# Patient Record
Sex: Female | Born: 1990 | Race: Black or African American | Hispanic: No | Marital: Single | Smoking: Never smoker
Health system: Southern US, Community
[De-identification: ages and names within clinical notes are randomized; demographics above are authoritative.]

## PROBLEM LIST (undated history)

## (undated) ENCOUNTER — Inpatient Hospital Stay (HOSPITAL_COMMUNITY): Payer: Self-pay

## (undated) DIAGNOSIS — K219 Gastro-esophageal reflux disease without esophagitis: Secondary | ICD-10-CM

## (undated) DIAGNOSIS — Z789 Other specified health status: Secondary | ICD-10-CM

## (undated) HISTORY — PX: WISDOM TOOTH EXTRACTION: SHX21

## (undated) HISTORY — PX: MANDIBLE SURGERY: SHX707

## (undated) HISTORY — PX: MANDIBLE RECONSTRUCTION: SHX431

## (undated) HISTORY — DX: Other specified health status: Z78.9

---
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
## 2012-01-23 ENCOUNTER — Other Ambulatory Visit (HOSPITAL_COMMUNITY)
Admission: RE | Admit: 2012-01-23 | Discharge: 2012-01-23 | Disposition: A | Discharge status: Home / Self Care | Payer: Medicaid Other | Source: Ambulatory Visit | Attending: Emergency Medicine | Admitting: Emergency Medicine

## 2012-01-23 ENCOUNTER — Emergency Department (INDEPENDENT_AMBULATORY_CARE_PROVIDER_SITE_OTHER)
Admission: EM | Admit: 2012-01-23 | Discharge: 2012-01-23 | Disposition: A | Discharge status: Home / Self Care | Payer: Medicaid Other | Source: Home / Self Care | Attending: Emergency Medicine | Admitting: Emergency Medicine

## 2012-01-23 ENCOUNTER — Encounter (HOSPITAL_COMMUNITY): Payer: Self-pay

## 2012-01-23 DIAGNOSIS — N760 Acute vaginitis: Secondary | ICD-10-CM | POA: Insufficient documentation

## 2012-01-23 DIAGNOSIS — N898 Other specified noninflammatory disorders of vagina: Secondary | ICD-10-CM

## 2012-01-23 DIAGNOSIS — Z113 Encounter for screening for infections with a predominantly sexual mode of transmission: Secondary | ICD-10-CM | POA: Insufficient documentation

## 2012-01-23 LAB — POCT PREGNANCY, URINE: Preg Test, Ur: NEGATIVE

## 2012-01-23 MED ORDER — METRONIDAZOLE 500 MG PO TABS: 500.0000 mg | ORAL_TABLET | Freq: Two times a day (BID) | ORAL | Status: DC

## 2012-01-23 NOTE — ED Provider Notes (Signed)
History     CSN: [id number]  Arrival date & time 01/23/12  1410   First MD Initiated Contact with Patient 01/23/12 1416      Chief Complaint  Patient presents with  . Vaginal Discharge    (Consider location/radiation/quality/duration/timing/severity/associated sxs/prior treatment) HPI Comments: Patient presents urgent care describing that she has a newly developed vaginal discharge that started about 5-6 days ago. Does not itch. Denies any pelvic pressure or pain. Denies any urinary symptoms such as increased pressure frequency or burning with urination. No fevers nausea or vomiting. She describes that during the month of November she was called informing her that she had " gonorrhea", and that she took treatment that was: In to pharmacy for this.( On chart review patient was diagnosed with Gardnerella vaginalis and prescribed Flagyl this was clarified today  Patient).  She was somewhat concerned that as she was treated her partner was not treated at that time, and she has had unprotected sex with this partner. Was concerned that perhaps she has been reexposed to an STD.  Patient is a 22 y.o. female presenting with vaginal discharge.  Vaginal Discharge This is a new problem. The current episode started more than 2 days ago. The problem occurs constantly. The problem has not changed since onset.Pertinent negatives include no abdominal pain and no shortness of breath. Nothing aggravates the symptoms. Nothing relieves the symptoms. She has tried nothing for the symptoms.    History reviewed. No pertinent past medical history.  History reviewed. No pertinent past surgical history.  No family history on file.  History  Substance Use Topics  . Smoking status: Not on file  . Smokeless tobacco: Not on file  . Alcohol Use: Not on file    OB History    Grav Para Term Preterm Abortions TAB SAB Ect Mult Living                  Review of Systems  Constitutional: Negative for chills,  activity change and appetite change.  Respiratory: Negative for shortness of breath.   Gastrointestinal: Negative for abdominal pain.  Genitourinary: Positive for vaginal discharge. Negative for dysuria, frequency and vaginal pain.  Musculoskeletal: Negative for myalgias, back pain and arthralgias.    Allergies  Review of patient's allergies indicates no known allergies.  Home Medications   Current Outpatient Rx  Name  Route  Sig  Dispense  Refill  . FLUCONAZOLE 150 MG PO TABS   Oral   Take 1 tablet (150 mg total) by mouth once.   1 tablet   5   . METRONIDAZOLE 500 MG PO TABS   Oral   Take 1 tablet (500 mg total) by mouth 2 (two) times daily.   14 tablet   0   . METRONIDAZOLE 500 MG PO TABS   Oral   Take 1 tablet (500 mg total) by mouth 2 (two) times daily.   14 tablet   0     BP 113/72  Pulse 83  Temp 97.3 F (36.3 C) (Oral)  Resp 16  SpO2 100%  LMP 12/31/2011  Physical Exam  Abdominal: Normal appearance. There is no tenderness. There is no rigidity, no rebound, no guarding, no CVA tenderness, no tenderness at McBurney's point and negative Murphy's sign.  Genitourinary:       ED Course  Procedures (including critical care time)   Labs Reviewed  POCT PREGNANCY, URINE  URINE CYTOLOGY ANCILLARY ONLY   No results found.   1. Vaginal discharge  MDM   Patient presents with a concern of a newly acquired her STD-vaginal discharge. Patient with no pelvic pain afebrile looks comfortable. Clarified today that she was previously diagnosed with Gardnerella not an STD. Today provided with a Flagyl prescription, patient has been instructed that she will be contacted if abnormal test results       Jimmie Molly, MD 01/23/12 1531

## 2012-01-23 NOTE — ED Notes (Signed)
States she was positive for GC last month, and has repeat syx; she received treatment, but boyfriend did not . Chart reports show negative GC and chlamydia

## 2012-01-27 ENCOUNTER — Encounter (HOSPITAL_COMMUNITY): Payer: Self-pay

## 2012-01-27 ENCOUNTER — Emergency Department (HOSPITAL_COMMUNITY)
Admission: EM | Admit: 2012-01-27 | Discharge: 2012-01-27 | Disposition: A | Discharge status: Home / Self Care | Payer: Medicaid Other | Attending: Emergency Medicine | Admitting: Emergency Medicine

## 2012-01-27 DIAGNOSIS — R112 Nausea with vomiting, unspecified: Secondary | ICD-10-CM | POA: Insufficient documentation

## 2012-01-27 DIAGNOSIS — Z3202 Encounter for pregnancy test, result negative: Secondary | ICD-10-CM | POA: Insufficient documentation

## 2012-01-27 LAB — URINALYSIS, MICROSCOPIC ONLY
Glucose, UA: NEGATIVE mg/dL
Hgb urine dipstick: NEGATIVE
Ketones, ur: 15 mg/dL — AB
Nitrite: NEGATIVE
Protein, ur: NEGATIVE mg/dL
Urobilinogen, UA: 0.2 mg/dL (ref 0.0–1.0)

## 2012-01-27 LAB — CBC WITH DIFFERENTIAL/PLATELET
Basophils Absolute: 0 10*3/uL (ref 0.0–0.1)
Basophils Relative: 0 % (ref 0–1)
HCT: 41.5 % (ref 36.0–46.0)
Hemoglobin: 14 g/dL (ref 12.0–15.0)
MCHC: 33.7 g/dL (ref 30.0–36.0)
Neutrophils Relative %: 88 % — ABNORMAL HIGH (ref 43–77)
Platelets: 178 10*3/uL (ref 150–400)
RDW: 13.3 % (ref 11.5–15.5)
WBC: 8 10*3/uL (ref 4.0–10.5)

## 2012-01-27 LAB — COMPREHENSIVE METABOLIC PANEL
Alkaline Phosphatase: 86 U/L (ref 39–117)
BUN: 13 mg/dL (ref 6–23)
GFR calc Af Amer: >90 mL/min (ref >90)
Glucose, Bld: 100 mg/dL — ABNORMAL HIGH (ref 70–99)
Sodium: 138 mEq/L (ref 135–145)
Total Protein: 7.8 g/dL (ref 6.0–8.3)

## 2012-01-27 MED ORDER — ONDANSETRON HCL 4 MG/2ML IJ SOLN
4.0000 mg | Freq: Once | INTRAMUSCULAR | Status: AC
  Administered 2012-01-27: 4 mg via INTRAVENOUS
  Filled 2012-01-27: qty 2

## 2012-01-27 MED ORDER — ONDANSETRON HCL 4 MG PO TABS: 4.0000 mg | ORAL_TABLET | Freq: Four times a day (QID) | ORAL | Status: DC

## 2012-01-27 MED ORDER — SODIUM CHLORIDE 0.9 % IV BOLUS (SEPSIS)
1000.0000 mL | Freq: Once | INTRAVENOUS | Status: AC
  Administered 2012-01-27: 1000 mL via INTRAVENOUS

## 2012-01-27 NOTE — ED Provider Notes (Signed)
History     CSN: [id number]  Arrival date & time 01/27/12  1624   First MD Initiated Contact with Patient 01/27/12 2033      Chief Complaint  Patient presents with  . Abdominal Pain    (Consider location/radiation/quality/duration/timing/severity/associated sxs/prior treatment) HPI Comments: This am woke with nausea ate lunch than vomited, ate again several hours later vomited again has not eaten since now feeling light headed and nauseate   Patient is a 22 y.o. female presenting with abdominal pain. The history is provided by the patient.  Abdominal Pain The primary symptoms of the illness include abdominal pain, nausea and vomiting. The primary symptoms of the illness do not include fever, shortness of breath, diarrhea, hematemesis, dysuria or vaginal bleeding. The current episode started 6 to 12 hours ago. The onset of the illness was gradual. The problem has not changed since onset. The abdominal pain began 6 to 12 hours ago. The pain came on gradually. The abdominal pain has been unchanged since its onset. The abdominal pain is generalized. The abdominal pain does not radiate. The severity of the abdominal pain is 3/10. The abdominal pain is relieved by vomiting. The abdominal pain is exacerbated by eating.  Nausea began today. The nausea is exacerbated by food.  The vomiting began today. Vomiting occurs 2 to 5 times per day. The emesis contains stomach contents and undigested food.  The patient states that she believes she is currently not pregnant. The patient has not had a change in bowel habit. Symptoms associated with the illness do not include chills, anorexia, diaphoresis, urgency, frequency or back pain.    History reviewed. No pertinent past medical history.  History reviewed. No pertinent past surgical history.  History reviewed. No pertinent family history.  History  Substance Use Topics  . Smoking status: Never Smoker   . Smokeless tobacco: Not on file  . Alcohol  Use: No    OB History    Grav Para Term Preterm Abortions TAB SAB Ect Mult Living                  Review of Systems  Constitutional: Negative for fever, chills and diaphoresis.  HENT: Negative.   Eyes: Negative.   Respiratory: Negative for shortness of breath.   Cardiovascular: Negative for chest pain.  Gastrointestinal: Positive for nausea, vomiting and abdominal pain. Negative for diarrhea, anorexia and hematemesis.  Genitourinary: Negative for dysuria, urgency, frequency, vaginal bleeding and difficulty urinating.  Musculoskeletal: Negative for back pain.  Skin: Negative for rash and wound.    Allergies  Review of patient's allergies indicates no known allergies.  Home Medications   Current Outpatient Rx  Name  Route  Sig  Dispense  Refill  . METRONIDAZOLE 500 MG PO TABS   Oral   Take 1 tablet (500 mg total) by mouth 2 (two) times daily.   14 tablet   0   . ONDANSETRON HCL 4 MG PO TABS   Oral   Take 1 tablet (4 mg total) by mouth every 6 (six) hours.   12 tablet   0     BP 120/72  Pulse 99  Temp 97.7 F (36.5 C) (Oral)  Resp 20  SpO2 100%  LMP 12/31/2011  Physical Exam  Constitutional: She is oriented to person, place, and time. She appears well-developed and well-nourished.  HENT:  Head: Normocephalic.  Eyes: Pupils are equal, round, and reactive to light.  Neck: Normal range of motion.  Cardiovascular: Normal rate.  Pulmonary/Chest: Effort normal.  Abdominal: Soft. Bowel sounds are normal. She exhibits no distension. There is no tenderness.  Genitourinary: No vaginal discharge found.  Musculoskeletal: Normal range of motion.  Neurological: She is alert and oriented to person, place, and time.  Skin: Skin is warm. There is pallor.    ED Course  Procedures (including critical care time)  Labs Reviewed  CBC WITH DIFFERENTIAL - Abnormal; Notable for the following:    Neutrophils Relative 88 (*)     Lymphocytes Relative 8 (*)     Lymphs Abs  0.6 (*)     All other components within normal limits  COMPREHENSIVE METABOLIC PANEL - Abnormal; Notable for the following:    Glucose, Bld 100 (*)     All other components within normal limits  URINALYSIS, MICROSCOPIC ONLY - Abnormal; Notable for the following:    Color, Urine AMBER (*)  BIOCHEMICALS MAY BE AFFECTED BY COLOR   APPearance CLOUDY (*)     Bilirubin Urine SMALL (*)     Ketones, ur 15 (*)     Leukocytes, UA SMALL (*)     Bacteria, UA FEW (*)     Squamous Epithelial / LPF MANY (*)     All other components within normal limits  POCT PREGNANCY, URINE  URINE CULTURE   No results found.   1. Nausea & vomiting       MDM  Viral illness Tolerating PO        Arman Filter, NP 01/27/12 2220

## 2012-01-27 NOTE — ED Notes (Signed)
Pt states she started having lower abd pain this morning. Vomited x 2 today. Denies any urinary sx. LMP 01/02/12.

## 2012-01-28 NOTE — ED Provider Notes (Signed)
Medical screening examination/treatment/procedure(s) were performed by non-physician practitioner and as supervising physician I was immediately available for consultation/collaboration.  Christopher J. Pollina, MD 01/28/12 1634 

## 2012-01-29 LAB — URINE CULTURE

## 2012-07-24 ENCOUNTER — Emergency Department (HOSPITAL_COMMUNITY): Admission: EM | Admit: 2012-07-24 | Discharge: 2012-07-24 | Payer: Medicaid Other | Source: Home / Self Care

## 2012-09-25 ENCOUNTER — Inpatient Hospital Stay (HOSPITAL_COMMUNITY)
Admission: AD | Admit: 2012-09-25 | Discharge: 2012-09-25 | Disposition: A | Discharge status: Home / Self Care | Payer: Medicaid Other | Source: Ambulatory Visit | Attending: Obstetrics & Gynecology | Admitting: Obstetrics & Gynecology

## 2012-09-25 ENCOUNTER — Encounter (HOSPITAL_COMMUNITY): Payer: Self-pay | Admitting: *Deleted

## 2012-09-25 DIAGNOSIS — B9689 Other specified bacterial agents as the cause of diseases classified elsewhere: Secondary | ICD-10-CM | POA: Insufficient documentation

## 2012-09-25 DIAGNOSIS — A499 Bacterial infection, unspecified: Secondary | ICD-10-CM | POA: Insufficient documentation

## 2012-09-25 DIAGNOSIS — N949 Unspecified condition associated with female genital organs and menstrual cycle: Secondary | ICD-10-CM | POA: Insufficient documentation

## 2012-09-25 DIAGNOSIS — N898 Other specified noninflammatory disorders of vagina: Secondary | ICD-10-CM

## 2012-09-25 DIAGNOSIS — O2390 Unspecified genitourinary tract infection in pregnancy, unspecified trimester: Secondary | ICD-10-CM | POA: Insufficient documentation

## 2012-09-25 DIAGNOSIS — N760 Acute vaginitis: Secondary | ICD-10-CM | POA: Insufficient documentation

## 2012-09-25 DIAGNOSIS — Z3201 Encounter for pregnancy test, result positive: Secondary | ICD-10-CM

## 2012-09-25 LAB — POCT PREGNANCY, URINE: Preg Test, Ur: POSITIVE — AB

## 2012-09-25 LAB — WET PREP, GENITAL

## 2012-09-25 MED ORDER — METRONIDAZOLE 500 MG PO TABS: 500.0000 mg | ORAL_TABLET | Freq: Two times a day (BID) | ORAL | Status: DC

## 2012-09-25 NOTE — MAU Provider Note (Signed)
Chief Complaint: Vaginal Discharge   First Provider Initiated Contact with Patient 09/25/12 1713     SUBJECTIVE HPI: Chelsea Wallace is a 22 y.o. G1P0 at Unknown by LMP who presents to maternity admissions reporting white vaginal discharge.  She saw her primary care provider this week and had a positive pregnancy test.  Patient's last menstrual period was 08/14/2012.  She also had spotting 08/31/12.  Today, she denies pain, vaginal bleeding, vaginal itching/burning, urinary symptoms, h/a, dizziness, n/v, or fever/chills.     No past medical history on file. No past surgical history on file. History   Social History  . Marital Status: Single    Spouse Name: N/A    Number of Children: N/A  . Years of Education: N/A   Occupational History  . Not on file.   Social History Main Topics  . Smoking status: Never Smoker   . Smokeless tobacco: Not on file  . Alcohol Use: No  . Drug Use: No  . Sexual Activity: Yes    Birth Control/ Protection: None   Other Topics Concern  . Not on file   Social History Narrative  . No narrative on file   No current facility-administered medications on file prior to encounter.   No current outpatient prescriptions on file prior to encounter.   No Known Allergies  ROS: Pertinent items in HPI  OBJECTIVE Blood pressure 113/71, pulse 80, temperature 97.7 F (36.5 C), temperature source Oral, resp. rate 18, height 5\' 5"  (1.651 m), weight 61.054 kg (134 lb 9.6 oz), last menstrual period 08/14/2012. GENERAL: Well-developed, well-nourished female in no acute distress.  HEENT: Normocephalic HEART: normal rate RESP: normal effort ABDOMEN: Soft, non-tender EXTREMITIES: Nontender, no edema NEURO: Alert and oriented Pelvic exam: Cervix pink, visually closed, without lesion, scant white creamy discharge, vaginal walls and external genitalia normal Bimanual exam: Cervix 0/long/high, firm, anterior, neg CMT, uterus nontender, nonenlarged, adnexa without  tenderness, enlargement, or mass  LAB RESULTS Results for orders placed during the hospital encounter of 09/25/12 (from the past 24 hour(s))  POCT PREGNANCY, URINE     Status: Abnormal   Collection Time    09/25/12  3:11 PM      Result Value Range   Preg Test, Ur POSITIVE (*) NEGATIVE  WET PREP, GENITAL     Status: Abnormal   Collection Time    09/25/12  5:04 PM      Result Value Range   Yeast Wet Prep HPF POC NONE SEEN  NONE SEEN   Trich, Wet Prep NONE SEEN  NONE SEEN   Clue Cells Wet Prep HPF POC MANY (*) NONE SEEN   WBC, Wet Prep HPF POC MANY (*) NONE SEEN     ASSESSMENT 1. Positive pregnancy test   2. Vaginal discharge in pregnancy in first trimester   3. Bacterial vaginosis     PLAN Discharge home Flagyl 500 mg BID x7 days Begin prenatal care as soon as possible Pregnancy verification letter provided Return to MAU as needed    Medication List         metroNIDAZOLE 500 MG tablet  Commonly known as:  FLAGYL  Take 1 tablet (500 mg total) by mouth 2 (two) times daily.       Follow-up Information   Follow up with [facility]. (Or prenatal provider of your choice. Return to MAU as needed.)    Specialty:  Obstetrics and Gynecology   Contact information:   [address] [place] [address] [phone]  Sharen Counter Certified Nurse-Midwife 09/25/2012  8:57 PM

## 2012-09-25 NOTE — MAU Note (Signed)
+  HPT with PCP last week, having white discharge x 1 month, had urine & STD testing done @ MD office - does not know results.  Has odor @ times, not itching.  No bleeding.

## 2012-09-28 ENCOUNTER — Encounter: Payer: Self-pay | Admitting: Obstetrics & Gynecology

## 2012-10-06 ENCOUNTER — Ambulatory Visit (INDEPENDENT_AMBULATORY_CARE_PROVIDER_SITE_OTHER): Payer: Medicaid Other | Admitting: Obstetrics

## 2012-10-06 ENCOUNTER — Encounter: Payer: Self-pay | Admitting: Obstetrics

## 2012-10-06 VITALS — BP 112/71 | Temp 98.0°F | Wt 139.0 lb

## 2012-10-06 DIAGNOSIS — N72 Inflammatory disease of cervix uteri: Secondary | ICD-10-CM

## 2012-10-06 DIAGNOSIS — A749 Chlamydial infection, unspecified: Secondary | ICD-10-CM

## 2012-10-06 DIAGNOSIS — Z3201 Encounter for pregnancy test, result positive: Secondary | ICD-10-CM

## 2012-10-06 DIAGNOSIS — Z3401 Encounter for supervision of normal first pregnancy, first trimester: Secondary | ICD-10-CM

## 2012-10-06 DIAGNOSIS — O98319 Other infections with a predominantly sexual mode of transmission complicating pregnancy, unspecified trimester: Secondary | ICD-10-CM

## 2012-10-06 DIAGNOSIS — A568 Sexually transmitted chlamydial infection of other sites: Secondary | ICD-10-CM

## 2012-10-06 MED ORDER — AZITHROMYCIN 250 MG PO TABS: 1000.0000 mg | ORAL_TABLET | Freq: Once | ORAL | Status: DC

## 2012-10-06 MED ORDER — CEFIXIME 400 MG PO TABS: 400.0000 mg | ORAL_TABLET | Freq: Every day | ORAL | Status: DC

## 2012-10-06 NOTE — Progress Notes (Signed)
P- 91 Subjective:    Chelsea Wallace is being seen today for her first obstetrical visit.  This is not a planned pregnancy. She is at [redacted]w[redacted]d gestation. Her obstetrical history is significant for none. Relationship with FOB: significant other, not living together. Patient unsure if she intend to breast feed. Pregnancy history fully reviewed.  Menstrual History: OB History   Grav Para Term Preterm Abortions TAB SAB Ect Mult Living   1                Menarche age: 78 Regular  Patient's last menstrual period was 08/14/2012.    The following portions of the patient's history were reviewed and updated as appropriate: allergies, current medications, past family history, past medical history, past social history, past surgical history and problem list.  Review of Systems Pertinent items are noted in HPI.    Objective:    General appearance: alert and no distress Abdomen: normal findings: soft, non-tender Pelvic: cervix normal in appearance, external genitalia normal, no adnexal masses or tenderness, no cervical motion tenderness, uterus normal size, shape, and consistency and vagina normal without discharge Extremities: extremities normal, atraumatic, no cyanosis or edema    Assessment:    Pregnancy at [redacted]w[redacted]d weeks   Chlamydia cervicitis.   Plan:    Initial labs drawn. Prenatal vitamins.  Counseling provided regarding continued use of seat belts, cessation of alcohol consumption, smoking or use of illicit drugs; infection precautions i.e., influenza/TDAP immunizations, toxoplasmosis,CMV, parvovirus, listeria and varicella; workplace safety, exercise during pregnancy; routine dental care, safe medications, sexual activity, hot tubs, saunas, pools, travel, caffeine use, fish and methlymercury, potential toxins, hair treatments, varicose veins Weight gain recommendations reviewed: underweight/BMI< 18.5--> gain 28 - 40 lbs; normal weight/BMI 18.5 - 24.9--> gain 25 - 35 lbs; overweight/BMI 25  - 29.9--> gain 15 - 25 lbs; obese/BMI >30->gain  11 - 20 lbs Problem list reviewed and updated. Azithromycin / Suprax Rx for positive chlamydia culture. AFP3 discussed: requested. Role of ultrasound in pregnancy discussed; fetal survey: requested. Amniocentesis discussed: not indicated. Follow up in 3 weeks. 50% of 20 min visit spent on counseling and coordination of care.

## 2012-10-07 ENCOUNTER — Encounter: Payer: Self-pay | Admitting: Obstetrics

## 2012-10-07 LAB — OBSTETRIC PANEL
Antibody Screen: NEGATIVE
Eosinophils Relative: 0 % (ref 0–5)
HCT: 34.8 % — ABNORMAL LOW (ref 36.0–46.0)
Hemoglobin: 11.8 g/dL — ABNORMAL LOW (ref 12.0–15.0)
Lymphocytes Relative: 28 % (ref 12–46)
Lymphs Abs: 1.9 10*3/uL (ref 0.7–4.0)
MCV: 90.6 fL (ref 78.0–100.0)
Monocytes Absolute: 0.7 10*3/uL (ref 0.1–1.0)
Monocytes Relative: 11 % (ref 3–12)
RBC: 3.84 MIL/uL — ABNORMAL LOW (ref 3.87–5.11)
RDW: 14.2 % (ref 11.5–15.5)
Rh Type: POSITIVE
Rubella: 7.18 Index — ABNORMAL HIGH (ref <0.90)
WBC: 6.6 10*3/uL (ref 4.0–10.5)

## 2012-10-07 LAB — VARICELLA ZOSTER ANTIBODY, IGG: Varicella IgG: 2556 Index — ABNORMAL HIGH (ref <135.00)

## 2012-10-07 LAB — CULTURE, OB URINE: Colony Count: NO GROWTH

## 2012-10-07 LAB — VITAMIN D 25 HYDROXY (VIT D DEFICIENCY, FRACTURES): Vit D, 25-Hydroxy: 30 ng/mL (ref 30–89)

## 2012-10-07 LAB — HIV ANTIBODY (ROUTINE TESTING W REFLEX): HIV: NONREACTIVE

## 2012-10-08 ENCOUNTER — Encounter: Payer: Self-pay | Admitting: Obstetrics

## 2012-10-16 ENCOUNTER — Other Ambulatory Visit: Payer: Self-pay | Admitting: *Deleted

## 2012-10-16 DIAGNOSIS — A749 Chlamydial infection, unspecified: Secondary | ICD-10-CM

## 2012-10-16 MED ORDER — AZITHROMYCIN 250 MG PO TABS: 1000.0000 mg | ORAL_TABLET | Freq: Once | ORAL | Status: DC

## 2012-10-20 ENCOUNTER — Encounter: Payer: Self-pay | Admitting: Obstetrics

## 2012-10-26 ENCOUNTER — Ambulatory Visit (INDEPENDENT_AMBULATORY_CARE_PROVIDER_SITE_OTHER): Payer: Medicaid Other | Admitting: Obstetrics

## 2012-10-26 ENCOUNTER — Encounter: Payer: Self-pay | Admitting: Obstetrics

## 2012-10-26 VITALS — BP 117/72 | Temp 99.1°F | Wt 138.0 lb

## 2012-10-26 DIAGNOSIS — Z3401 Encounter for supervision of normal first pregnancy, first trimester: Secondary | ICD-10-CM

## 2012-10-26 DIAGNOSIS — Z3400 Encounter for supervision of normal first pregnancy, unspecified trimester: Secondary | ICD-10-CM

## 2012-10-26 LAB — POCT URINALYSIS DIPSTICK
Leukocytes, UA: NEGATIVE
Protein, UA: NEGATIVE
Spec Grav, UA: 1.01
Urobilinogen, UA: NEGATIVE
pH, UA: 8

## 2012-10-26 NOTE — Progress Notes (Signed)
P 79 Patient reports she has no concerns today

## 2012-11-11 ENCOUNTER — Encounter: Payer: Self-pay | Admitting: Obstetrics

## 2012-11-11 ENCOUNTER — Ambulatory Visit (INDEPENDENT_AMBULATORY_CARE_PROVIDER_SITE_OTHER): Payer: Medicaid Other | Admitting: Obstetrics

## 2012-11-11 VITALS — BP 129/78 | Temp 98.2°F | Wt 138.8 lb

## 2012-11-11 DIAGNOSIS — Z3400 Encounter for supervision of normal first pregnancy, unspecified trimester: Secondary | ICD-10-CM

## 2012-11-11 DIAGNOSIS — Z3401 Encounter for supervision of normal first pregnancy, first trimester: Secondary | ICD-10-CM

## 2012-11-11 LAB — POCT URINALYSIS DIPSTICK
Blood, UA: NEGATIVE
Ketones, UA: NEGATIVE
Leukocytes, UA: NEGATIVE
pH, UA: 6

## 2012-11-11 NOTE — Progress Notes (Signed)
Pulse- 98 

## 2012-12-07 ENCOUNTER — Ambulatory Visit (INDEPENDENT_AMBULATORY_CARE_PROVIDER_SITE_OTHER): Payer: Medicaid Other | Admitting: Obstetrics

## 2012-12-07 ENCOUNTER — Encounter: Payer: Self-pay | Admitting: Obstetrics

## 2012-12-07 VITALS — BP 112/74 | Temp 98.2°F | Wt 141.0 lb

## 2012-12-07 DIAGNOSIS — Z3402 Encounter for supervision of normal first pregnancy, second trimester: Secondary | ICD-10-CM

## 2012-12-07 DIAGNOSIS — Z1389 Encounter for screening for other disorder: Secondary | ICD-10-CM

## 2012-12-07 DIAGNOSIS — Z3400 Encounter for supervision of normal first pregnancy, unspecified trimester: Secondary | ICD-10-CM

## 2012-12-07 LAB — POCT URINALYSIS DIPSTICK
Bilirubin, UA: NEGATIVE
Blood, UA: NEGATIVE
Glucose, UA: NEGATIVE
Nitrite, UA: NEGATIVE
Spec Grav, UA: 1.01
pH, UA: 7

## 2012-12-07 NOTE — Progress Notes (Signed)
Pulse 90  Pt states that she has a lump on her lower left side that is tender to the touch. Pt states that she notices it occasionally.

## 2012-12-07 NOTE — Addendum Note (Signed)
Addended by: Coral Ceo A on: 12/07/2012 12:24 PM   Modules accepted: Orders

## 2012-12-08 LAB — AFP, QUAD SCREEN
Age Alone: 1:1180 {titer}
Down Syndrome Scr Risk Est: <1:38500 {titer}
HCG, Total: 20924 m[IU]/mL
INH: 139.5 pg/mL
MoM for AFP: 1.16
MoM for INH: 0.74
MoM for hCG: 0.8
Open Spina bifida: NEGATIVE
Osb Risk: 1:15700 {titer}
Tri 18 Scr Risk Est: NEGATIVE
Trisomy 18 (Edward) Syndrome Interp.: 1:3910 {titer}

## 2013-01-04 ENCOUNTER — Encounter: Payer: Medicaid Other | Admitting: Obstetrics

## 2013-01-05 ENCOUNTER — Encounter: Payer: Self-pay | Admitting: Obstetrics

## 2013-01-05 ENCOUNTER — Ambulatory Visit (INDEPENDENT_AMBULATORY_CARE_PROVIDER_SITE_OTHER): Payer: Medicaid Other

## 2013-01-05 ENCOUNTER — Ambulatory Visit (INDEPENDENT_AMBULATORY_CARE_PROVIDER_SITE_OTHER): Payer: Medicaid Other | Admitting: Obstetrics

## 2013-01-05 ENCOUNTER — Encounter: Payer: Medicaid Other | Admitting: Obstetrics

## 2013-01-05 VITALS — BP 113/73 | Temp 98.5°F | Wt 150.0 lb

## 2013-01-05 DIAGNOSIS — Z363 Encounter for antenatal screening for malformations: Secondary | ICD-10-CM

## 2013-01-05 DIAGNOSIS — Z3402 Encounter for supervision of normal first pregnancy, second trimester: Secondary | ICD-10-CM

## 2013-01-05 DIAGNOSIS — Z1389 Encounter for screening for other disorder: Secondary | ICD-10-CM

## 2013-01-05 DIAGNOSIS — Z3400 Encounter for supervision of normal first pregnancy, unspecified trimester: Secondary | ICD-10-CM

## 2013-01-05 LAB — US OB DETAIL + 14 WK

## 2013-01-05 LAB — POCT URINALYSIS DIPSTICK
Ketones, UA: NEGATIVE
Leukocytes, UA: NEGATIVE
Nitrite, UA: NEGATIVE
Urobilinogen, UA: NEGATIVE
pH, UA: 7

## 2013-01-05 NOTE — Progress Notes (Signed)
Pulse 89 Pt is doing well. 

## 2013-01-11 ENCOUNTER — Encounter: Payer: Self-pay | Admitting: Obstetrics & Gynecology

## 2013-01-11 LAB — US OB DETAIL + 14 WK

## 2013-01-14 NOTE — L&D Delivery Note (Signed)
Delivery Note At 7:59 PM a viable female was delivered via Vaginal, Spontaneous Delivery (Presentation: ; Occiput Anterior).  APGAR: 8, 9; weight .   Placenta status: Intact, Spontaneous.  Cord: 3 vessels with the following complications: None.  Cord pH: none  Anesthesia: Epidural  Episiotomy: Median Lacerations: None Suture Repair: 2.0 chromic Est. Blood Loss (mL): 350  Mom to postpartum.  Baby to Couplet care / Skin to Skin.  Brock Badharles A Harper 05/23/2013, 8:44 PM

## 2013-01-19 ENCOUNTER — Ambulatory Visit (INDEPENDENT_AMBULATORY_CARE_PROVIDER_SITE_OTHER): Payer: Medicaid Other | Admitting: Obstetrics

## 2013-01-19 ENCOUNTER — Encounter: Payer: Self-pay | Admitting: Obstetrics

## 2013-01-19 VITALS — BP 112/72 | Temp 97.8°F | Wt 153.0 lb

## 2013-01-19 DIAGNOSIS — Z3402 Encounter for supervision of normal first pregnancy, second trimester: Secondary | ICD-10-CM

## 2013-01-19 DIAGNOSIS — Z3400 Encounter for supervision of normal first pregnancy, unspecified trimester: Secondary | ICD-10-CM

## 2013-01-19 LAB — POCT URINALYSIS DIPSTICK
Spec Grav, UA: 1.02
pH, UA: 5

## 2013-01-19 NOTE — Progress Notes (Signed)
HR - 85 Pt in office today for routine OB visit, denies any concerns at this time

## 2013-02-04 ENCOUNTER — Encounter: Payer: Self-pay | Admitting: Obstetrics

## 2013-02-04 ENCOUNTER — Other Ambulatory Visit: Payer: Medicaid Other

## 2013-02-04 ENCOUNTER — Ambulatory Visit (INDEPENDENT_AMBULATORY_CARE_PROVIDER_SITE_OTHER): Payer: Medicaid Other | Admitting: Obstetrics

## 2013-02-04 VITALS — BP 117/76 | Temp 98.1°F | Wt 156.0 lb

## 2013-02-04 DIAGNOSIS — Z3400 Encounter for supervision of normal first pregnancy, unspecified trimester: Secondary | ICD-10-CM

## 2013-02-04 DIAGNOSIS — K219 Gastro-esophageal reflux disease without esophagitis: Secondary | ICD-10-CM | POA: Insufficient documentation

## 2013-02-04 LAB — POCT URINALYSIS DIPSTICK
BILIRUBIN UA: NEGATIVE
Blood, UA: NEGATIVE
Glucose, UA: NEGATIVE
Ketones, UA: NEGATIVE
NITRITE UA: NEGATIVE
PH UA: 5
Spec Grav, UA: 1.02
UROBILINOGEN UA: NEGATIVE

## 2013-02-04 LAB — CBC
HEMATOCRIT: 33.1 % — AB (ref 36.0–46.0)
Hemoglobin: 11.2 g/dL — ABNORMAL LOW (ref 12.0–15.0)
MCH: 31.3 pg (ref 26.0–34.0)
MCHC: 33.8 g/dL (ref 30.0–36.0)
MCV: 92.5 fL (ref 78.0–100.0)
Platelets: 168 10*3/uL (ref 150–400)
RBC: 3.58 MIL/uL — ABNORMAL LOW (ref 3.87–5.11)
RDW: 14.4 % (ref 11.5–15.5)
WBC: 6.8 10*3/uL (ref 4.0–10.5)

## 2013-02-04 MED ORDER — OMEPRAZOLE 20 MG PO CPDR: 20.0000 mg | DELAYED_RELEASE_CAPSULE | Freq: Two times a day (BID) | ORAL | Status: DC

## 2013-02-04 NOTE — Progress Notes (Signed)
HR -81 Pt in office today for routine OB visit and 2 hour glucose test, denies any concerns at this time.

## 2013-02-05 LAB — GLUCOSE TOLERANCE, 2 HOURS W/ 1HR
GLUCOSE, 2 HOUR: 65 mg/dL — AB (ref 70–139)
Glucose, 1 hour: 98 mg/dL (ref 70–170)
Glucose, Fasting: 78 mg/dL (ref 70–99)

## 2013-02-05 LAB — HIV ANTIBODY (ROUTINE TESTING W REFLEX): HIV: NONREACTIVE

## 2013-02-05 LAB — RPR

## 2013-02-18 ENCOUNTER — Encounter: Payer: Self-pay | Admitting: Obstetrics

## 2013-02-18 ENCOUNTER — Ambulatory Visit (INDEPENDENT_AMBULATORY_CARE_PROVIDER_SITE_OTHER): Payer: Medicaid Other | Admitting: Obstetrics

## 2013-02-18 VITALS — BP 124/79 | Temp 98.5°F | Wt 160.0 lb

## 2013-02-18 DIAGNOSIS — Z3400 Encounter for supervision of normal first pregnancy, unspecified trimester: Secondary | ICD-10-CM

## 2013-02-18 LAB — POCT URINALYSIS DIPSTICK
BILIRUBIN UA: NEGATIVE
GLUCOSE UA: NEGATIVE
KETONES UA: NEGATIVE
Leukocytes, UA: NEGATIVE
Nitrite, UA: NEGATIVE
PH UA: 6
Protein, UA: NEGATIVE
RBC UA: NEGATIVE
SPEC GRAV UA: 1.015
Urobilinogen, UA: NEGATIVE

## 2013-02-18 NOTE — Progress Notes (Signed)
Pulse 103 Pt states that she is having lower back pain.  Pt states that the pain comes and goes throughout the day.

## 2013-03-04 ENCOUNTER — Encounter: Payer: Medicaid Other | Admitting: Obstetrics

## 2013-03-05 ENCOUNTER — Encounter: Payer: Medicaid Other | Admitting: Obstetrics

## 2013-03-10 ENCOUNTER — Encounter: Payer: Self-pay | Admitting: Obstetrics

## 2013-03-10 ENCOUNTER — Ambulatory Visit (INDEPENDENT_AMBULATORY_CARE_PROVIDER_SITE_OTHER): Payer: Medicaid Other | Admitting: Obstetrics

## 2013-03-10 VITALS — BP 132/79 | Temp 98.6°F | Wt 165.0 lb

## 2013-03-10 DIAGNOSIS — Z3400 Encounter for supervision of normal first pregnancy, unspecified trimester: Secondary | ICD-10-CM

## 2013-03-10 LAB — POCT URINALYSIS DIPSTICK
BILIRUBIN UA: NEGATIVE
GLUCOSE UA: NEGATIVE
Ketones, UA: NEGATIVE
Leukocytes, UA: NEGATIVE
Nitrite, UA: NEGATIVE
Protein, UA: NEGATIVE
RBC UA: NEGATIVE
SPEC GRAV UA: 1.015
Urobilinogen, UA: NEGATIVE
pH, UA: 7

## 2013-03-10 NOTE — Progress Notes (Signed)
Pulse: 94 Patient denies any concerns.

## 2013-03-16 ENCOUNTER — Other Ambulatory Visit (INDEPENDENT_AMBULATORY_CARE_PROVIDER_SITE_OTHER): Payer: Medicaid Other

## 2013-03-16 DIAGNOSIS — J029 Acute pharyngitis, unspecified: Secondary | ICD-10-CM

## 2013-03-16 LAB — POCT RAPID STREP A (OFFICE): RAPID STREP A SCREEN: NEGATIVE

## 2013-03-16 NOTE — Progress Notes (Unsigned)
Pt in office today for strep a screen due to sore throat and cold symptoms.  Strep A screen negative today.  Pt advised of OTC medications to take.

## 2013-03-24 ENCOUNTER — Ambulatory Visit (INDEPENDENT_AMBULATORY_CARE_PROVIDER_SITE_OTHER): Payer: Medicaid Other | Admitting: Obstetrics

## 2013-03-24 ENCOUNTER — Encounter: Payer: Self-pay | Admitting: Obstetrics

## 2013-03-24 VITALS — BP 149/85 | Temp 98.6°F | Wt 170.0 lb

## 2013-03-24 DIAGNOSIS — Z3400 Encounter for supervision of normal first pregnancy, unspecified trimester: Secondary | ICD-10-CM

## 2013-03-24 LAB — POCT URINALYSIS DIPSTICK
BILIRUBIN UA: NEGATIVE
Blood, UA: NEGATIVE
Glucose, UA: NEGATIVE
KETONES UA: NEGATIVE
LEUKOCYTES UA: NEGATIVE
NITRITE UA: NEGATIVE
PH UA: 5
Spec Grav, UA: 1.02
Urobilinogen, UA: NEGATIVE

## 2013-03-24 NOTE — Addendum Note (Signed)
Addended by: Odessa FlemingBOHNE, Matricia Begnaud M on: 03/24/2013 04:35 PM   Modules accepted: Orders

## 2013-03-24 NOTE — Progress Notes (Signed)
Pulse: 103  Patient states she would like to be check for STDs and Infections. Patient denies any itching, irritatin, burning, vaginal discharge or odor.

## 2013-03-25 LAB — GC/CHLAMYDIA PROBE AMP
CT PROBE, AMP APTIMA: POSITIVE — AB
GC Probe RNA: NEGATIVE

## 2013-03-25 LAB — WET PREP BY MOLECULAR PROBE
CANDIDA SPECIES: NEGATIVE
GARDNERELLA VAGINALIS: POSITIVE — AB
Trichomonas vaginosis: NEGATIVE

## 2013-03-29 ENCOUNTER — Other Ambulatory Visit: Payer: Self-pay | Admitting: *Deleted

## 2013-03-29 DIAGNOSIS — N760 Acute vaginitis: Secondary | ICD-10-CM

## 2013-03-29 DIAGNOSIS — B9689 Other specified bacterial agents as the cause of diseases classified elsewhere: Secondary | ICD-10-CM

## 2013-03-29 DIAGNOSIS — A749 Chlamydial infection, unspecified: Secondary | ICD-10-CM

## 2013-03-29 MED ORDER — AZITHROMYCIN 250 MG PO TABS: 1000.0000 mg | ORAL_TABLET | Freq: Once | ORAL | Status: DC

## 2013-03-29 MED ORDER — CEFIXIME 400 MG PO TABS: 400.0000 mg | ORAL_TABLET | Freq: Once | ORAL | Status: DC

## 2013-03-29 MED ORDER — METRONIDAZOLE 500 MG PO TABS: 500.0000 mg | ORAL_TABLET | Freq: Two times a day (BID) | ORAL | Status: DC

## 2013-03-30 ENCOUNTER — Ambulatory Visit (INDEPENDENT_AMBULATORY_CARE_PROVIDER_SITE_OTHER): Payer: Medicaid Other | Admitting: Obstetrics

## 2013-03-30 ENCOUNTER — Encounter: Payer: Self-pay | Admitting: Obstetrics

## 2013-03-30 VITALS — BP 130/82 | Temp 97.6°F | Wt 170.0 lb

## 2013-03-30 DIAGNOSIS — Z3400 Encounter for supervision of normal first pregnancy, unspecified trimester: Secondary | ICD-10-CM

## 2013-03-30 LAB — POCT URINALYSIS DIPSTICK
Bilirubin, UA: NEGATIVE
GLUCOSE UA: NEGATIVE
KETONES UA: NEGATIVE
Nitrite, UA: NEGATIVE
RBC UA: NEGATIVE
SPEC GRAV UA: 1.025
UROBILINOGEN UA: NEGATIVE
pH, UA: 5

## 2013-03-30 NOTE — Progress Notes (Signed)
Pulse: 108 Patient states she has a tight feeling in her feet and hands but that she hasn't noticed any swelling. Patient states the Prilosec is no longer helping with the heartburn. Patient states she has a question about about her phone call with the nurse but would just like to talk to Dr. Clearance CootsHarper by herself.

## 2013-03-31 ENCOUNTER — Encounter: Payer: Medicaid Other | Admitting: Obstetrics

## 2013-04-01 ENCOUNTER — Encounter: Payer: Self-pay | Admitting: Obstetrics

## 2013-04-02 ENCOUNTER — Other Ambulatory Visit: Payer: Self-pay | Admitting: *Deleted

## 2013-04-02 DIAGNOSIS — N72 Inflammatory disease of cervix uteri: Secondary | ICD-10-CM

## 2013-04-02 MED ORDER — CEFIXIME 500 MG/5ML PO SUSR: 500.0000 mg | Freq: Once | ORAL | Status: DC

## 2013-04-12 ENCOUNTER — Encounter: Payer: Medicaid Other | Admitting: Obstetrics

## 2013-04-13 ENCOUNTER — Ambulatory Visit (INDEPENDENT_AMBULATORY_CARE_PROVIDER_SITE_OTHER): Payer: Medicaid Other | Admitting: Obstetrics

## 2013-04-13 VITALS — BP 134/78 | Temp 98.6°F | Wt 173.0 lb

## 2013-04-13 DIAGNOSIS — Z3400 Encounter for supervision of normal first pregnancy, unspecified trimester: Secondary | ICD-10-CM

## 2013-04-13 LAB — POCT URINALYSIS DIPSTICK
Bilirubin, UA: NEGATIVE
Blood, UA: NEGATIVE
Glucose, UA: NEGATIVE
KETONES UA: NEGATIVE
Nitrite, UA: NEGATIVE
PROTEIN UA: NEGATIVE
SPEC GRAV UA: 1.015
Urobilinogen, UA: NEGATIVE
pH, UA: 6

## 2013-04-13 NOTE — Progress Notes (Signed)
Pulse 97 Pt states that she is having some slight swelling in feet.  Pt states that she has been having some lower pelvic pain on her right side for the past few days.

## 2013-04-15 LAB — STREP B DNA PROBE: GBSP: POSITIVE

## 2013-04-18 ENCOUNTER — Encounter: Payer: Self-pay | Admitting: Obstetrics

## 2013-04-20 ENCOUNTER — Ambulatory Visit (INDEPENDENT_AMBULATORY_CARE_PROVIDER_SITE_OTHER): Payer: Medicaid Other | Admitting: Obstetrics

## 2013-04-20 ENCOUNTER — Encounter: Payer: Self-pay | Admitting: Obstetrics

## 2013-04-20 VITALS — BP 128/80 | Temp 97.4°F | Wt 174.0 lb

## 2013-04-20 DIAGNOSIS — Z3400 Encounter for supervision of normal first pregnancy, unspecified trimester: Secondary | ICD-10-CM

## 2013-04-20 LAB — POCT URINALYSIS DIPSTICK
BILIRUBIN UA: NEGATIVE
Blood, UA: NEGATIVE
Glucose, UA: NEGATIVE
Ketones, UA: NEGATIVE
Leukocytes, UA: NEGATIVE
NITRITE UA: NEGATIVE
Spec Grav, UA: 1.02
Urobilinogen, UA: NEGATIVE
pH, UA: 5

## 2013-04-20 NOTE — Progress Notes (Signed)
Pulse- none

## 2013-04-27 ENCOUNTER — Ambulatory Visit (INDEPENDENT_AMBULATORY_CARE_PROVIDER_SITE_OTHER): Payer: Medicaid Other | Admitting: Obstetrics

## 2013-04-27 VITALS — BP 112/76 | Temp 99.0°F | Wt 174.0 lb

## 2013-04-27 DIAGNOSIS — Z3400 Encounter for supervision of normal first pregnancy, unspecified trimester: Secondary | ICD-10-CM

## 2013-04-27 LAB — POCT URINALYSIS DIPSTICK
Bilirubin, UA: NEGATIVE
Blood, UA: NEGATIVE
Glucose, UA: NEGATIVE
Ketones, UA: NEGATIVE
Leukocytes, UA: NEGATIVE
Nitrite, UA: NEGATIVE
PH UA: 6
SPEC GRAV UA: 1.015
Urobilinogen, UA: NEGATIVE

## 2013-04-27 NOTE — Progress Notes (Signed)
Pulse: 92  Patient denies any concerns. Patient states her feet turned purple today. Patient states they stayed purple for about 5 minutes and they were sore.

## 2013-04-28 ENCOUNTER — Encounter: Payer: Self-pay | Admitting: *Deleted

## 2013-04-29 ENCOUNTER — Encounter: Payer: Self-pay | Admitting: Obstetrics

## 2013-05-03 ENCOUNTER — Encounter: Payer: Self-pay | Admitting: Obstetrics

## 2013-05-04 ENCOUNTER — Ambulatory Visit (INDEPENDENT_AMBULATORY_CARE_PROVIDER_SITE_OTHER): Payer: Medicaid Other | Admitting: Obstetrics

## 2013-05-04 VITALS — BP 115/74 | HR 94 | Temp 97.4°F | Wt 177.0 lb

## 2013-05-04 DIAGNOSIS — Z3400 Encounter for supervision of normal first pregnancy, unspecified trimester: Secondary | ICD-10-CM

## 2013-05-04 LAB — POCT URINALYSIS DIPSTICK
BILIRUBIN UA: NEGATIVE
Glucose, UA: NEGATIVE
KETONES UA: NEGATIVE
Nitrite, UA: NEGATIVE
PH UA: 6
RBC UA: NEGATIVE
SPEC GRAV UA: 1.02
Urobilinogen, UA: NEGATIVE

## 2013-05-05 ENCOUNTER — Encounter: Payer: Self-pay | Admitting: Obstetrics

## 2013-05-05 ENCOUNTER — Encounter: Payer: Medicaid Other | Admitting: Obstetrics

## 2013-05-05 NOTE — Progress Notes (Signed)
Subjective:    Chelsea Wallace is a 23 y.o. female being seen today for her obstetrical visit. She is at 3464w5d gestation. Patient reports heartburn. Fetal movement: normal.  Past Medical History  Diagnosis Date  . Medical history non-contributory     Past Surgical History  Procedure Laterality Date  . No past surgeries      Current outpatient prescriptions:Prenatal Vit-Fe Fum-FA-Omega (ONE-A-DAY WOMENS PRENATAL) 28-0.8 & 440 MG MISC, Take by mouth., Disp: , Rfl:  No Known Allergies  History  Substance Use Topics  . Smoking status: Never Smoker   . Smokeless tobacco: Not on file  . Alcohol Use: No    Family History  Problem Relation Age of Onset  . Diabetes Mother   . Diabetes Maternal Grandfather   . Hypertension Paternal Grandmother   . Hypertension Paternal Grandfather      Review of Systems Constitutional: negative for anorexia Gastrointestinal: negative for abdominal pain Genitourinary:negative for vaginal discharge Musculoskeletal:negative for back pain Behavioral/Psych: negative for depression and tobacco use   Objective:    BP 115/74  Pulse 94  Temp(Src) 97.4 F (36.3 C)  Wt 177 lb (80.287 kg)  LMP 08/14/2012 FHT: 150 BPM  Uterine Size: size equals dates  Presentations: unsure  Pelvic Exam: Deferred                         Assessment:    Pregnancy @ 6364w5d weeks   Plan:   Plans for delivery: Vaginal anticipated; labs reviewed; problem list updated Counseling: Consent signed. Infant feeding: plans to breastfeed. Cigarette smoking: never smoked. L&D discussion: symptoms of labor, discussed when to call, discussed what number to call, anesthetic/analgesic options reviewed and delivering clinician:  plans Physician. Postpartum supports and preparation: circumcision discussed and contraception plans discussed.  Follow up in 1 Week.

## 2013-05-07 ENCOUNTER — Other Ambulatory Visit: Payer: Self-pay | Admitting: *Deleted

## 2013-05-07 DIAGNOSIS — O98811 Other maternal infectious and parasitic diseases complicating pregnancy, first trimester: Principal | ICD-10-CM

## 2013-05-07 DIAGNOSIS — A749 Chlamydial infection, unspecified: Secondary | ICD-10-CM

## 2013-05-07 MED ORDER — AZITHROMYCIN 250 MG PO TABS: 1000.0000 mg | ORAL_TABLET | Freq: Once | ORAL | Status: DC

## 2013-05-11 ENCOUNTER — Ambulatory Visit (INDEPENDENT_AMBULATORY_CARE_PROVIDER_SITE_OTHER): Payer: Medicaid Other | Admitting: Obstetrics

## 2013-05-11 VITALS — BP 121/76 | HR 102 | Temp 98.4°F | Wt 180.0 lb

## 2013-05-11 DIAGNOSIS — Z3400 Encounter for supervision of normal first pregnancy, unspecified trimester: Secondary | ICD-10-CM

## 2013-05-11 LAB — POCT URINALYSIS DIPSTICK
BILIRUBIN UA: NEGATIVE
Blood, UA: NEGATIVE
GLUCOSE UA: NEGATIVE
Ketones, UA: NEGATIVE
Nitrite, UA: NEGATIVE
Protein, UA: NEGATIVE
SPEC GRAV UA: 1.01
Urobilinogen, UA: NEGATIVE
pH, UA: 7

## 2013-05-12 ENCOUNTER — Other Ambulatory Visit: Payer: Self-pay | Admitting: *Deleted

## 2013-05-12 DIAGNOSIS — A749 Chlamydial infection, unspecified: Secondary | ICD-10-CM

## 2013-05-12 DIAGNOSIS — B379 Candidiasis, unspecified: Secondary | ICD-10-CM

## 2013-05-12 DIAGNOSIS — O98811 Other maternal infectious and parasitic diseases complicating pregnancy, first trimester: Principal | ICD-10-CM

## 2013-05-12 MED ORDER — FLUCONAZOLE 150 MG PO TABS: 150.0000 mg | ORAL_TABLET | Freq: Once | ORAL | Status: DC

## 2013-05-12 MED ORDER — AZITHROMYCIN 250 MG PO TABS: 1000.0000 mg | ORAL_TABLET | Freq: Once | ORAL | Status: DC

## 2013-05-13 ENCOUNTER — Encounter: Payer: Self-pay | Admitting: Obstetrics

## 2013-05-13 NOTE — Progress Notes (Signed)
Subjective:    Chelsea Wallace is a 23 y.o. female being seen today for her obstetrical visit. She is at 6941w6d gestation. Patient reports no complaints. Fetal movement: normal.  Problem List Items Addressed This Visit   None    Visit Diagnoses   Supervision of normal first pregnancy    -  Primary    Relevant Orders       POCT urinalysis dipstick (Completed)      Patient Active Problem List   Diagnosis Date Noted  . GERD without esophagitis 02/04/2013  . Cervicitis and endocervicitis 10/06/2012  . Chlamydia infection complicating pregnancy in first trimester 10/06/2012    Objective:    BP 121/76  Pulse 102  Temp(Src) 98.4 F (36.9 C)  Wt 180 lb (81.647 kg)  LMP 08/14/2012 FHT: 150 BPM  Uterine Size: size equals dates  Presentations: unsure  Pelvic Exam: Deferred   Assessment:    Pregnancy @ 9241w6d weeks   Plan:   Plans for delivery: Vaginal anticipated; labs reviewed; problem list updated Counseling: Consent signed. Infant feeding: plans to breastfeed. Cigarette smoking: never smoked. L&D discussion: symptoms of labor, discussed when to call, discussed what number to call, anesthetic/analgesic options reviewed and delivering clinician:  plans Physician. Postpartum supports and preparation: circumcision discussed and contraception plans discussed.  Follow up in 1 Week.

## 2013-05-18 ENCOUNTER — Ambulatory Visit (INDEPENDENT_AMBULATORY_CARE_PROVIDER_SITE_OTHER): Payer: Medicaid Other | Admitting: Obstetrics

## 2013-05-18 ENCOUNTER — Encounter: Payer: Self-pay | Admitting: Obstetrics

## 2013-05-18 VITALS — BP 117/78 | HR 89 | Temp 98.1°F | Wt 177.0 lb

## 2013-05-18 DIAGNOSIS — Z3400 Encounter for supervision of normal first pregnancy, unspecified trimester: Secondary | ICD-10-CM

## 2013-05-18 NOTE — Progress Notes (Signed)
Subjective:    Chelsea Wallace is a 23 y.o. female being seen today for her obstetrical visit. She is at 749w4d gestation. Patient reports no complaints. Fetal movement: normal.  Problem List Items Addressed This Visit   None    Visit Diagnoses   Supervision of normal first pregnancy    -  Primary    Relevant Orders       POCT urinalysis dipstick      Patient Active Problem List   Diagnosis Date Noted  . GERD without esophagitis 02/04/2013  . Cervicitis and endocervicitis 10/06/2012  . Chlamydia infection complicating pregnancy in first trimester 10/06/2012    Objective:    BP 117/78  Pulse 89  Temp(Src) 98.1 F (36.7 C)  Wt 177 lb (80.287 kg)  LMP 08/14/2012 FHT: 150 BPM  Uterine Size: size equals dates  Presentations: cephalic  Pelvic Exam:              Dilation: 1cm       Effacement: 50%             Station:  -2    Consistency: medium            Position: middle     Assessment:    Pregnancy @ [redacted]w[redacted]d weeks   Plan:   Plans for delivery: Vaginal anticipated; labs reviewed; problem list updated Counseling: Consent signed. Infant feeding: plans to breastfeed. Cigarette smoking: never smoked. L&D discussion: symptoms of labor, discussed when to call, discussed what number to call, anesthetic/analgesic options reviewed and delivering clinician:  plans Physician. Postpartum supports and preparation: circumcision discussed and contraception plans discussed.  Follow up in 1 Week.

## 2013-05-19 LAB — POCT URINALYSIS DIPSTICK
BILIRUBIN UA: NEGATIVE
Blood, UA: NEGATIVE
Glucose, UA: NEGATIVE
NITRITE UA: NEGATIVE
Spec Grav, UA: 1.01
Urobilinogen, UA: NEGATIVE
pH, UA: 7

## 2013-05-21 ENCOUNTER — Inpatient Hospital Stay (HOSPITAL_COMMUNITY)
Admission: AD | Admit: 2013-05-21 | Discharge: 2013-05-22 | Disposition: A | Discharge status: Home / Self Care | Payer: Medicaid Other | Source: Ambulatory Visit | Attending: Obstetrics | Admitting: Obstetrics

## 2013-05-21 DIAGNOSIS — O9989 Other specified diseases and conditions complicating pregnancy, childbirth and the puerperium: Principal | ICD-10-CM

## 2013-05-21 DIAGNOSIS — O99891 Other specified diseases and conditions complicating pregnancy: Secondary | ICD-10-CM | POA: Insufficient documentation

## 2013-05-21 DIAGNOSIS — N949 Unspecified condition associated with female genital organs and menstrual cycle: Secondary | ICD-10-CM | POA: Insufficient documentation

## 2013-05-21 DIAGNOSIS — M549 Dorsalgia, unspecified: Secondary | ICD-10-CM | POA: Insufficient documentation

## 2013-05-21 HISTORY — DX: Gastro-esophageal reflux disease without esophagitis: K21.9

## 2013-05-21 NOTE — MAU Note (Signed)
Having a lot of back pain and pelvic pressure all day. Some mucousy bloody d/c since Tues after sve. Cx was 1cm Tues

## 2013-05-22 ENCOUNTER — Inpatient Hospital Stay (HOSPITAL_COMMUNITY)
Admission: AD | Admit: 2013-05-22 | Discharge: 2013-05-25 | DRG: 775 | Disposition: A | Discharge status: Home / Self Care | Payer: Medicaid Other | Source: Ambulatory Visit | Attending: Obstetrics | Admitting: Obstetrics

## 2013-05-22 ENCOUNTER — Encounter (HOSPITAL_COMMUNITY): Payer: Self-pay

## 2013-05-22 DIAGNOSIS — K219 Gastro-esophageal reflux disease without esophagitis: Secondary | ICD-10-CM | POA: Diagnosis present

## 2013-05-22 DIAGNOSIS — Z833 Family history of diabetes mellitus: Secondary | ICD-10-CM

## 2013-05-22 DIAGNOSIS — M4120 Other idiopathic scoliosis, site unspecified: Secondary | ICD-10-CM | POA: Diagnosis present

## 2013-05-22 DIAGNOSIS — Z22330 Carrier of Group B streptococcus: Secondary | ICD-10-CM

## 2013-05-22 DIAGNOSIS — O99892 Other specified diseases and conditions complicating childbirth: Secondary | ICD-10-CM | POA: Diagnosis present

## 2013-05-22 DIAGNOSIS — O4100X0 Oligohydramnios, unspecified trimester, not applicable or unspecified: Principal | ICD-10-CM | POA: Diagnosis present

## 2013-05-22 DIAGNOSIS — O9989 Other specified diseases and conditions complicating pregnancy, childbirth and the puerperium: Secondary | ICD-10-CM

## 2013-05-22 MED ORDER — OXYCODONE-ACETAMINOPHEN 5-325 MG PO TABS
2.0000 | ORAL_TABLET | Freq: Once | ORAL | Status: AC
  Administered 2013-05-22: 2 via ORAL
  Filled 2013-05-22: qty 2

## 2013-05-22 NOTE — Discharge Instructions (Signed)
Keep Doctor Appointment on This Tuesday !! Fetal Movement Counts Patient Name: __________________________________________________ Patient Due Date: ____________________ Performing a fetal movement count is highly recommended in high-risk pregnancies, but it is good for every pregnant woman to do. Your caregiver may ask you to start counting fetal movements at 28 weeks of the pregnancy. Fetal movements often increase:  After eating a full meal.  After physical activity.  After eating or drinking something sweet or cold.  At rest. Pay attention to when you feel the baby is most active. This will help you notice a pattern of your baby's sleep and wake cycles and what factors contribute to an increase in fetal movement. It is important to perform a fetal movement count at the same time each day when your baby is normally most active.  HOW TO COUNT FETAL MOVEMENTS 1. Find a quiet and comfortable area to sit or lie down on your left side. Lying on your left side provides the best blood and oxygen circulation to your baby. 2. Write down the day and time on a sheet of paper or in a journal. 3. Start counting kicks, flutters, swishes, rolls, or jabs in a 2 hour period. You should feel at least 10 movements within 2 hours. 4. If you do not feel 10 movements in 2 hours, wait 2 3 hours and count again. Look for a change in the pattern or not enough counts in 2 hours. SEEK MEDICAL CARE IF:  You feel less than 10 counts in 2 hours, tried twice.  There is no movement in over an hour.  The pattern is changing or taking longer each day to reach 10 counts in 2 hours.  You feel the baby is not moving as he or she usually does. Date: ____________ Movements: ____________ Start time: ____________ Doreatha MartinFinish time: ____________  Date: ____________ Movements: ____________ Start time: ____________ Doreatha MartinFinish time: ____________ Date: ____________ Movements: ____________ Start time: ____________ Doreatha MartinFinish time:  ____________ Date: ____________ Movements: ____________ Start time: ____________ Doreatha MartinFinish time: ____________ Date: ____________ Movements: ____________ Start time: ____________ Doreatha MartinFinish time: ____________ Date: ____________ Movements: ____________ Start time: ____________ Doreatha MartinFinish time: ____________ Date: ____________ Movements: ____________ Start time: ____________ Doreatha MartinFinish time: ____________ Date: ____________ Movements: ____________ Start time: ____________ Doreatha MartinFinish time: ____________  Date: ____________ Movements: ____________ Start time: ____________ Doreatha MartinFinish time: ____________ Date: ____________ Movements: ____________ Start time: ____________ Doreatha MartinFinish time: ____________ Date: ____________ Movements: ____________ Start time: ____________ Doreatha MartinFinish time: ____________ Date: ____________ Movements: ____________ Start time: ____________ Doreatha MartinFinish time: ____________ Date: ____________ Movements: ____________ Start time: ____________ Doreatha MartinFinish time: ____________ Date: ____________ Movements: ____________ Start time: ____________ Doreatha MartinFinish time: ____________ Date: ____________ Movements: ____________ Start time: ____________ Doreatha MartinFinish time: ____________  Date: ____________ Movements: ____________ Start time: ____________ Doreatha MartinFinish time: ____________ Date: ____________ Movements: ____________ Start time: ____________ Doreatha MartinFinish time: ____________ Date: ____________ Movements: ____________ Start time: ____________ Doreatha MartinFinish time: ____________ Date: ____________ Movements: ____________ Start time: ____________ Doreatha MartinFinish time: ____________ Date: ____________ Movements: ____________ Start time: ____________ Doreatha MartinFinish time: ____________ Date: ____________ Movements: ____________ Start time: ____________ Doreatha MartinFinish time: ____________ Date: ____________ Movements: ____________ Start time: ____________ Doreatha MartinFinish time: ____________  Date: ____________ Movements: ____________ Start time: ____________ Doreatha MartinFinish time: ____________ Date: ____________ Movements:  ____________ Start time: ____________ Doreatha MartinFinish time: ____________ Date: ____________ Movements: ____________ Start time: ____________ Doreatha MartinFinish time: ____________ Date: ____________ Movements: ____________ Start time: ____________ Doreatha MartinFinish time: ____________ Date: ____________ Movements: ____________ Start time: ____________ Doreatha MartinFinish time: ____________ Date: ____________ Movements: ____________ Start time: ____________ Doreatha MartinFinish time: ____________ Date: ____________ Movements: ____________ Start time: ____________ Doreatha MartinFinish time: ____________  Date:  ____________ Movements: ____________ Start time: ____________ Doreatha Martin time: ____________ Date: ____________ Movements: ____________ Start time: ____________ Doreatha Martin time: ____________ Date: ____________ Movements: ____________ Start time: ____________ Doreatha Martin time: ____________ Date: ____________ Movements: ____________ Start time: ____________ Doreatha Martin time: ____________ Date: ____________ Movements: ____________ Start time: ____________ Doreatha Martin time: ____________ Date: ____________ Movements: ____________ Start time: ____________ Doreatha Martin time: ____________ Date: ____________ Movements: ____________ Start time: ____________ Doreatha Martin time: ____________  Date: ____________ Movements: ____________ Start time: ____________ Doreatha Martin time: ____________ Date: ____________ Movements: ____________ Start time: ____________ Doreatha Martin time: ____________ Date: ____________ Movements: ____________ Start time: ____________ Doreatha Martin time: ____________ Date: ____________ Movements: ____________ Start time: ____________ Doreatha Martin time: ____________ Date: ____________ Movements: ____________ Start time: ____________ Doreatha Martin time: ____________ Date: ____________ Movements: ____________ Start time: ____________ Doreatha Martin time: ____________ Date: ____________ Movements: ____________ Start time: ____________ Doreatha Martin time: ____________  Date: ____________ Movements: ____________ Start time: ____________ Doreatha Martin  time: ____________ Date: ____________ Movements: ____________ Start time: ____________ Doreatha Martin time: ____________ Date: ____________ Movements: ____________ Start time: ____________ Doreatha Martin time: ____________ Date: ____________ Movements: ____________ Start time: ____________ Doreatha Martin time: ____________ Date: ____________ Movements: ____________ Start time: ____________ Doreatha Martin time: ____________ Date: ____________ Movements: ____________ Start time: ____________ Doreatha Martin time: ____________ Date: ____________ Movements: ____________ Start time: ____________ Doreatha Martin time: ____________  Date: ____________ Movements: ____________ Start time: ____________ Doreatha Martin time: ____________ Date: ____________ Movements: ____________ Start time: ____________ Doreatha Martin time: ____________ Date: ____________ Movements: ____________ Start time: ____________ Doreatha Martin time: ____________ Date: ____________ Movements: ____________ Start time: ____________ Doreatha Martin time: ____________ Date: ____________ Movements: ____________ Start time: ____________ Doreatha Martin time: ____________ Date: ____________ Movements: ____________ Start time: ____________ Doreatha Martin time: ____________ Document Released: 01/30/2006 Document Revised: 12/18/2011 Document Reviewed: 10/28/2011 Exit[employer] Patient Information 2014 [employer] Braxton Hicks Contractions Pregnancy is commonly associated with contractions of the uterus throughout the pregnancy. Towards the end of pregnancy (32 to 34 weeks), these contractions [facility] Willa Rough) can develop more often and may become more forceful. This is not true labor because these contractions do not result in opening (dilatation) and thinning of the cervix. They are sometimes difficult to tell apart from true labor because these contractions can be forceful and people have different pain tolerances. You should not feel embarrassed if you go to the hospital with false labor. Sometimes, the only way to tell if you are in true  labor is for your caregiver to follow the changes in the cervix. How to tell the difference between true and false labor:  False labor.  The contractions of false labor are usually shorter, irregular and not as hard as those of true labor.  They are often felt in the front of the lower abdomen and in the groin.  They may leave with walking around or changing positions while lying down.  They get weaker and are shorter lasting as time goes on.  These contractions are usually irregular.  They do not usually become progressively stronger, regular and closer together as with true labor.  True labor.  Contractions in true labor last 30 to 70 seconds, become very regular, usually become more intense, and increase in frequency.  They do not go away with walking.  The discomfort is usually felt in the top of the uterus and spreads to the lower abdomen and low back.  True labor can be determined by your caregiver with an exam. This will show that the cervix is dilating and getting thinner. If there are no prenatal problems or other health problems associated with the pregnancy, it is completely safe to be sent home with false labor  and await the onset of true labor. HOME CARE INSTRUCTIONS   Keep up with your usual exercises and instructions.  Take medications as directed.  Keep your regular prenatal appointment.  Eat and drink lightly if you think you are going into labor.  If BH contractions are making you uncomfortable:  Change your activity position from lying down or resting to walking/walking to resting.  Sit and rest in a tub of warm water.  Drink 2 to 3 glasses of water. Dehydration may cause B-H contractions.  Do slow and deep breathing several times an hour. SEEK IMMEDIATE MEDICAL CARE IF:   Your contractions continue to become stronger, more regular, and closer together.  You have a gushing, burst or leaking of fluid from the vagina.  An oral temperature above  102 F (38.9 C) develops.  You have passage of blood-tinged mucus.  You develop vaginal bleeding.  You develop continuous belly (abdominal) pain.  You have low back pain that you never had before.  You feel the baby's head pushing down causing pelvic pressure.  The baby is not moving as much as it used to. Document Released: 12/31/2004 Document Revised: 03/25/2011 Document Reviewed: 10/12/2012 [facility] Patient Information 2014 [place], [place].

## 2013-05-23 ENCOUNTER — Encounter (HOSPITAL_COMMUNITY): Payer: Self-pay | Admitting: *Deleted

## 2013-05-23 ENCOUNTER — Inpatient Hospital Stay (HOSPITAL_COMMUNITY): Payer: Medicaid Other | Admitting: Anesthesiology

## 2013-05-23 ENCOUNTER — Inpatient Hospital Stay (HOSPITAL_COMMUNITY): Payer: Medicaid Other

## 2013-05-23 ENCOUNTER — Encounter (HOSPITAL_COMMUNITY): Payer: Medicaid Other | Admitting: Anesthesiology

## 2013-05-23 LAB — CBC
HEMATOCRIT: 36.7 % (ref 36.0–46.0)
HEMOGLOBIN: 12.3 g/dL (ref 12.0–15.0)
MCH: 30.7 pg (ref 26.0–34.0)
MCHC: 33.5 g/dL (ref 30.0–36.0)
MCV: 91.5 fL (ref 78.0–100.0)
Platelets: 171 10*3/uL (ref 150–400)
RBC: 4.01 MIL/uL (ref 3.87–5.11)
RDW: 13.7 % (ref 11.5–15.5)
WBC: 8.8 10*3/uL (ref 4.0–10.5)

## 2013-05-23 LAB — RPR

## 2013-05-23 MED ORDER — FENTANYL 2.5 MCG/ML BUPIVACAINE 1/10 % EPIDURAL INFUSION (WH - ANES)
14.0000 mL/h | INTRAMUSCULAR | Status: DC | PRN
  Administered 2013-05-23 (×2): 14 mL/h via EPIDURAL
  Filled 2013-05-23 (×2): qty 125

## 2013-05-23 MED ORDER — OXYTOCIN 40 UNITS IN LACTATED RINGERS INFUSION - SIMPLE MED
1.0000 m[IU]/min | INTRAVENOUS | Status: DC
  Administered 2013-05-23: 2 m[IU]/min via INTRAVENOUS

## 2013-05-23 MED ORDER — OXYTOCIN 40 UNITS IN LACTATED RINGERS INFUSION - SIMPLE MED: 62.5000 mL/h | INTRAVENOUS | Status: DC | PRN

## 2013-05-23 MED ORDER — PRENATAL MULTIVITAMIN CH
1.0000 | ORAL_TABLET | Freq: Every day | ORAL | Status: DC
  Administered 2013-05-24 – 2013-05-25 (×2): 1 via ORAL
  Filled 2013-05-23 (×2): qty 1

## 2013-05-23 MED ORDER — OXYTOCIN 40 UNITS IN LACTATED RINGERS INFUSION - SIMPLE MED
62.5000 mL/h | INTRAVENOUS | Status: DC
  Filled 2013-05-23: qty 1000

## 2013-05-23 MED ORDER — LANOLIN HYDROUS EX OINT: TOPICAL_OINTMENT | CUTANEOUS | Status: DC | PRN

## 2013-05-23 MED ORDER — WITCH HAZEL-GLYCERIN EX PADS: 1.0000 "application " | MEDICATED_PAD | CUTANEOUS | Status: DC | PRN

## 2013-05-23 MED ORDER — DIPHENHYDRAMINE HCL 25 MG PO CAPS: 25.0000 mg | ORAL_CAPSULE | Freq: Four times a day (QID) | ORAL | Status: DC | PRN

## 2013-05-23 MED ORDER — PENICILLIN G POTASSIUM 5000000 UNITS IJ SOLR
2.5000 10*6.[IU] | INTRAVENOUS | Status: DC
  Administered 2013-05-23 (×3): 2.5 10*6.[IU] via INTRAVENOUS
  Filled 2013-05-23 (×6): qty 2.5

## 2013-05-23 MED ORDER — DIPHENHYDRAMINE HCL 50 MG/ML IJ SOLN: 12.5000 mg | INTRAMUSCULAR | Status: DC | PRN

## 2013-05-23 MED ORDER — PHENYLEPHRINE 40 MCG/ML (10ML) SYRINGE FOR IV PUSH (FOR BLOOD PRESSURE SUPPORT)
80.0000 ug | PREFILLED_SYRINGE | INTRAVENOUS | Status: DC | PRN
  Filled 2013-05-23: qty 2

## 2013-05-23 MED ORDER — LIDOCAINE HCL (PF) 1 % IJ SOLN
30.0000 mL | INTRAMUSCULAR | Status: DC | PRN
  Administered 2013-05-23: 30 mL via SUBCUTANEOUS
  Filled 2013-05-23: qty 30

## 2013-05-23 MED ORDER — NALBUPHINE HCL 10 MG/ML IJ SOLN: 10.0000 mg | Freq: Once | INTRAMUSCULAR | Status: DC

## 2013-05-23 MED ORDER — ONDANSETRON HCL 4 MG/2ML IJ SOLN: 4.0000 mg | Freq: Four times a day (QID) | INTRAMUSCULAR | Status: DC | PRN

## 2013-05-23 MED ORDER — OXYTOCIN BOLUS FROM INFUSION
500.0000 mL | INTRAVENOUS | Status: DC
  Administered 2013-05-23: 500 mL via INTRAVENOUS

## 2013-05-23 MED ORDER — PENICILLIN G POTASSIUM 5000000 UNITS IJ SOLR
5.0000 10*6.[IU] | Freq: Once | INTRAVENOUS | Status: AC
  Administered 2013-05-23: 5 10*6.[IU] via INTRAVENOUS
  Filled 2013-05-23: qty 5

## 2013-05-23 MED ORDER — LACTATED RINGERS IV SOLN
500.0000 mL | Freq: Once | INTRAVENOUS | Status: AC
  Administered 2013-05-23: 500 mL via INTRAVENOUS

## 2013-05-23 MED ORDER — SENNOSIDES-DOCUSATE SODIUM 8.6-50 MG PO TABS
2.0000 | ORAL_TABLET | ORAL | Status: DC
  Administered 2013-05-23 – 2013-05-24 (×2): 2 via ORAL
  Filled 2013-05-23 (×2): qty 2

## 2013-05-23 MED ORDER — CITRIC ACID-SODIUM CITRATE 334-500 MG/5ML PO SOLN: 30.0000 mL | ORAL | Status: DC | PRN

## 2013-05-23 MED ORDER — DIBUCAINE 1 % RE OINT: 1.0000 "application " | TOPICAL_OINTMENT | RECTAL | Status: DC | PRN

## 2013-05-23 MED ORDER — ZOLPIDEM TARTRATE 5 MG PO TABS: 5.0000 mg | ORAL_TABLET | Freq: Every evening | ORAL | Status: DC | PRN

## 2013-05-23 MED ORDER — FLEET ENEMA 7-19 GM/118ML RE ENEM: 1.0000 | ENEMA | RECTAL | Status: DC | PRN

## 2013-05-23 MED ORDER — MEDROXYPROGESTERONE ACETATE 150 MG/ML IM SUSP: 150.0000 mg | INTRAMUSCULAR | Status: DC | PRN

## 2013-05-23 MED ORDER — HYDROXYZINE HCL 50 MG/ML IM SOLN
50.0000 mg | Freq: Once | INTRAMUSCULAR | Status: DC
  Filled 2013-05-23: qty 1

## 2013-05-23 MED ORDER — IBUPROFEN 600 MG PO TABS
600.0000 mg | ORAL_TABLET | Freq: Four times a day (QID) | ORAL | Status: DC
  Administered 2013-05-24 – 2013-05-25 (×6): 600 mg via ORAL
  Filled 2013-05-23 (×6): qty 1

## 2013-05-23 MED ORDER — SIMETHICONE 80 MG PO CHEW: 80.0000 mg | CHEWABLE_TABLET | ORAL | Status: DC | PRN

## 2013-05-23 MED ORDER — IBUPROFEN 600 MG PO TABS
600.0000 mg | ORAL_TABLET | Freq: Four times a day (QID) | ORAL | Status: DC | PRN
  Administered 2013-05-23: 600 mg via ORAL
  Filled 2013-05-23: qty 1

## 2013-05-23 MED ORDER — ONDANSETRON HCL 4 MG/2ML IJ SOLN: 4.0000 mg | INTRAMUSCULAR | Status: DC | PRN

## 2013-05-23 MED ORDER — BENZOCAINE-MENTHOL 20-0.5 % EX AERO
1.0000 "application " | INHALATION_SPRAY | CUTANEOUS | Status: DC | PRN
  Administered 2013-05-24: 1 via TOPICAL
  Filled 2013-05-23: qty 56

## 2013-05-23 MED ORDER — ACETAMINOPHEN 325 MG PO TABS: 650.0000 mg | ORAL_TABLET | ORAL | Status: DC | PRN

## 2013-05-23 MED ORDER — OXYCODONE-ACETAMINOPHEN 5-325 MG PO TABS
1.0000 | ORAL_TABLET | ORAL | Status: DC | PRN
  Filled 2013-05-23: qty 1

## 2013-05-23 MED ORDER — ONDANSETRON HCL 4 MG PO TABS: 4.0000 mg | ORAL_TABLET | ORAL | Status: DC | PRN

## 2013-05-23 MED ORDER — LACTATED RINGERS IV SOLN
INTRAVENOUS | Status: DC
  Administered 2013-05-23 (×3): via INTRAVENOUS

## 2013-05-23 MED ORDER — TETANUS-DIPHTH-ACELL PERTUSSIS 5-2.5-18.5 LF-MCG/0.5 IM SUSP: 0.5000 mL | Freq: Once | INTRAMUSCULAR | Status: DC

## 2013-05-23 MED ORDER — EPHEDRINE 5 MG/ML INJ
10.0000 mg | INTRAVENOUS | Status: DC | PRN
  Filled 2013-05-23: qty 2
  Filled 2013-05-23: qty 4

## 2013-05-23 MED ORDER — EPHEDRINE 5 MG/ML INJ
10.0000 mg | INTRAVENOUS | Status: DC | PRN
  Filled 2013-05-23: qty 2

## 2013-05-23 MED ORDER — LIDOCAINE HCL (PF) 1 % IJ SOLN
INTRAMUSCULAR | Status: DC | PRN
  Administered 2013-05-23 (×2): 5 mL

## 2013-05-23 MED ORDER — TERBUTALINE SULFATE 1 MG/ML IJ SOLN: 0.2500 mg | Freq: Once | INTRAMUSCULAR | Status: DC | PRN

## 2013-05-23 MED ORDER — PHENYLEPHRINE 40 MCG/ML (10ML) SYRINGE FOR IV PUSH (FOR BLOOD PRESSURE SUPPORT)
80.0000 ug | PREFILLED_SYRINGE | INTRAVENOUS | Status: DC | PRN
  Filled 2013-05-23: qty 2
  Filled 2013-05-23: qty 10

## 2013-05-23 MED ORDER — OXYCODONE-ACETAMINOPHEN 5-325 MG PO TABS: 1.0000 | ORAL_TABLET | ORAL | Status: DC | PRN

## 2013-05-23 MED ORDER — LACTATED RINGERS IV SOLN: 500.0000 mL | INTRAVENOUS | Status: DC | PRN

## 2013-05-23 NOTE — MAU Note (Signed)
To Add: pt reports baby moving less often today.

## 2013-05-23 NOTE — Progress Notes (Signed)
Chelsea Wallace is a 23 y.o. G1P0 at 916w2d by LMP admitted for an hydramnios.  Subjective:   Objective: BP 126/81  Pulse 74  Temp(Src) 98.3 F (36.8 C) (Oral)  Resp 20  Ht 5\' 5"  (1.651 m)  Wt 180 lb (81.647 kg)  BMI 29.95 kg/m2  SpO2 100%  LMP 08/14/2012      FHT:  FHR: 135 bpm, variability: moderate,  accelerations:  Present,  decelerations:  Present variable UC:   regular, every 2-3 minutes SVE:   Dilation: 3.5 Effacement (%): 90 Station: -2 Exam by:: k fields, rn  Labs: Lab Results  Component Value Date   WBC 8.8 05/23/2013   HGB 12.3 05/23/2013   HCT 36.7 05/23/2013   MCV 91.5 05/23/2013   PLT 171 05/23/2013    Assessment / Plan: Augmentation of labor, progressing well  Labor: Progressing normally Preeclampsia:  n/a Fetal Wellbeing:  Category I Pain Control:  Epidural I/D:  n/a Anticipated MOD:  NSVD  Chelsea Wallace 05/23/2013, 11:02 AM

## 2013-05-23 NOTE — Progress Notes (Signed)
Chelsea Wallace is a 23 y.o. G1P0 at 5853w2d by LMP admitted for an hydramnios.  Subjective:   Objective: BP 117/69  Pulse 99  Temp(Src) 98.6 F (37 C) (Oral)  Resp 20  Ht 5\' 5"  (1.651 m)  Wt 180 lb (81.647 kg)  BMI 29.95 kg/m2  SpO2 100%  LMP 08/14/2012      FHT:  FHR: 140 bpm, variability: moderate,  accelerations:  Present,  decelerations:  Present variable UC:   regular, every 1-4 minutes SVE:   Dilation: 8 Effacement (%): 100 Station: 0 Exam by:: harper  Labs: Lab Results  Component Value Date   WBC 8.8 05/23/2013   HGB 12.3 05/23/2013   HCT 36.7 05/23/2013   MCV 91.5 05/23/2013   PLT 171 05/23/2013    Assessment / Plan: Augmentation of labor, progressing well  Labor: Progressing normally Preeclampsia:  n/a Fetal Wellbeing:  Category I Pain Control:  Epidural I/D:  n/a Anticipated MOD:  NSVD  Brock Badharles A Harper 05/23/2013, 6:21 PM

## 2013-05-23 NOTE — MAU Note (Signed)
Contractions throughout day.  Still having bloody show like yesterday.  Last cervix exam 1.5 yesterday. No leaking per pt.

## 2013-05-23 NOTE — H&P (Addendum)
Chelsea Wallace is a 23 y.o. female presenting for UC's.  Variable decels noted on EFM and BPP done which revealed no amniotic fluid.  Delivery recommended by MFM. Maternal Medical History:  Reason for admission: Oligohydramnios.  Contractions: Onset was 13-24 hours ago.    Fetal activity: Perceived fetal activity is decreased.   Last perceived fetal movement was within the past 24 hours.    Prenatal complications: no prenatal complications Prenatal Complications - Diabetes: none.    OB History   Grav Para Term Preterm Abortions TAB SAB Ect Mult Living   1              Past Medical History  Diagnosis Date  . GERD (gastroesophageal reflux disease)    Past Surgical History  Procedure Laterality Date  . Mandible reconstruction      lower jaw surgery 2011   Family History: family history includes Diabetes in her maternal grandfather and mother; Hypertension in her paternal grandfather and paternal grandmother. Social History:  reports that she has never smoked. She has never used smokeless tobacco. She reports that she does not drink alcohol or use illicit drugs.   Prenatal Transfer Tool  Maternal Diabetes: No Genetic Screening: Normal Maternal Ultrasounds/Referrals: Normal Fetal Ultrasounds or other Referrals:  None Maternal Substance Abuse:  No Significant Maternal Medications:  None Significant Maternal Lab Results:  Lab values include: Group B Strep positive Other Comments:  None  Review of Systems  All other systems reviewed and are negative.   Dilation: 2.5 Effacement (%): 80 Station: -2 Exam by:: [employer] Mutual of OmahaBenji Stanley RN Blood pressure 130/83, pulse 82, temperature 98.7 F (37.1 C), temperature source Oral, resp. rate 18, height 5\' 5"  (1.651 m), weight 180 lb (81.647 kg), last menstrual period 08/14/2012. Maternal Exam:  Uterine Assessment: Contraction strength is mild.  Abdomen: Patient reports no abdominal tenderness. Fetal presentation: vertex  Introitus: Normal  vulva. Normal vagina.  Pelvis: adequate for delivery.   Cervix: Cervix evaluated by digital exam.     Physical Exam  Constitutional: She is oriented to person, place, and time. She appears well-developed and well-nourished.  HENT:  Head: Normocephalic and atraumatic.  Eyes: Conjunctivae are normal. Pupils are equal, round, and reactive to light.  Neck: Normal range of motion. Neck supple.  Cardiovascular: Normal rate and regular rhythm.   Respiratory: Effort normal.  GI: Soft.  Genitourinary: Vagina normal and uterus normal.  Musculoskeletal: Normal range of motion.  Neurological: She is alert and oriented to person, place, and time.  Skin: Skin is warm and dry.  Psychiatric: She has a normal mood and affect. Her behavior is normal. Judgment and thought content normal.    Prenatal labs: ABO, Rh: O/POS/-- (09/23 1643) Antibody: NEG (09/23 1643) Rubella: 7.18 (09/23 1643) RPR: NON REAC (01/22 1155)  HBsAg: NEGATIVE (09/23 1643)  HIV: NON REACTIVE (01/22 1155)  GBS: POSITIVE (03/31 1628)   Assessment/Plan: 40 weeks.  Anhydramnios.  Admit.  Pitocin prn.   Brock Badharles A Clent Damore 05/23/2013, 5:08 AM

## 2013-05-23 NOTE — Anesthesia Procedure Notes (Signed)
Epidural Patient location during procedure: OB Start time: 05/23/2013 10:40 AM  Staffing Anesthesiologist: Brayton CavesJACKSON, Kiele Heavrin Performed by: anesthesiologist   Preanesthetic Checklist Completed: patient identified, site marked, surgical consent, pre-op evaluation, timeout performed, IV checked, risks and benefits discussed and monitors and equipment checked  Epidural Patient position: sitting Prep: site prepped and draped and DuraPrep Patient monitoring: continuous pulse ox and blood pressure Approach: midline Location: L3-L4 Injection technique: LOR air  Needle:  Needle type: Tuohy  Needle gauge: 17 G Needle length: 9 cm and 9 Needle insertion depth: 5 cm cm Catheter type: closed end flexible Catheter size: 19 Gauge Catheter at skin depth: 10 cm Test dose: negative  Assessment Events: blood not aspirated, injection not painful, no injection resistance, negative IV test and no paresthesia  Additional Notes Patient identified.  Risk benefits discussed including failed block, incomplete pain control, headache, nerve damage, paralysis, blood pressure changes, nausea, vomiting, reactions to medication both toxic or allergic, and postpartum back pain.  Patient expressed understanding and wished to proceed.  All questions were answered.  Sterile technique used throughout procedure and epidural site dressed with sterile barrier dressing. No paresthesia or other complications noted.The patient did not experience any signs of intravascular injection such as tinnitus or metallic taste in mouth nor signs of intrathecal spread such as rapid motor block. Please see nursing notes for vital signs.

## 2013-05-23 NOTE — Progress Notes (Signed)
Chelsea Wallace is a 23 y.o. G1P0 at 2155w2d by LMP admitted for an hydramnios.  Subjective:   Objective: BP 126/75  Pulse 102  Temp(Src) 98.6 F (37 C) (Oral)  Resp 20  Ht 5\' 5"  (1.651 m)  Wt 180 lb (81.647 kg)  BMI 29.95 kg/m2  SpO2 100%  LMP 08/14/2012      FHT:  FHR: 140 bpm, variability: moderate,  accelerations:  Present,  decelerations:  Present variable UC:   regular, every 1-4 minutes SVE:   Dilation: 10 Effacement (%): 100 Station: +2 Exam by:: k fields, rn  Labs: Lab Results  Component Value Date   WBC 8.8 05/23/2013   HGB 12.3 05/23/2013   HCT 36.7 05/23/2013   MCV 91.5 05/23/2013   PLT 171 05/23/2013    Assessment / Plan: Augmentation of labor, progressing well  Labor: Progressing normally Preeclampsia:  n/a Fetal Wellbeing:  Category I Pain Control:  Epidural I/D:  n/a Anticipated MOD:  NSVD  Chelsea Wallace 05/23/2013, 6:52 PM

## 2013-05-23 NOTE — Progress Notes (Signed)
Chelsea Wallace CandleL Chelsea Wallace is a 23 y.o. G1P0 at 4357w2d by LMP admitted for an hydramnios.  Subjective:   Objective: BP 105/64  Pulse 84  Temp(Src) 98.2 F (36.8 C) (Oral)  Resp 20  Ht 5\' 5"  (1.651 m)  Wt 180 lb (81.647 kg)  BMI 29.95 kg/m2  SpO2 100%  LMP 08/14/2012      FHT:  FHR: 130 bpm, variability: moderate,  accelerations:  Present,  decelerations:  Absent UC:   regular, every 2-5 minutes SVE:   Dilation: 6 Effacement (%): 100 Station: -1 Exam by:: k fields, rn  Labs: Lab Results  Component Value Date   WBC 8.8 05/23/2013   HGB 12.3 05/23/2013   HCT 36.7 05/23/2013   MCV 91.5 05/23/2013   PLT 171 05/23/2013    Assessment / Plan: Augmentation of labor, progressing well  Labor: Progressing normally Preeclampsia:  n/a Fetal Wellbeing:  Category I Pain Control:  Epidural I/D:  n/a Anticipated MOD:  NSVD  Brock Badharles A Iyanla Eilers 05/23/2013, 2:04 PM

## 2013-05-23 NOTE — Anesthesia Preprocedure Evaluation (Addendum)
Anesthesia Evaluation  Patient identified by MRN, date of birth, ID band Patient awake    Reviewed: Allergy & Precautions, H&P , Patient's Chart, lab work & pertinent test results  Airway Mallampati: II TM Distance: >3 FB Neck ROM: full    Dental   Pulmonary  breath sounds clear to auscultation        Cardiovascular Rhythm:regular Rate:Normal     Neuro/Psych    GI/Hepatic   Endo/Other    Renal/GU      Musculoskeletal   Abdominal   Peds  Hematology   Anesthesia Other Findings scoliosis  Reproductive/Obstetrics (+) Pregnancy                          Anesthesia Physical Anesthesia Plan  ASA: II  Anesthesia Plan: Epidural   Post-op Pain Management:    Induction:   Airway Management Planned:   Additional Equipment:   Intra-op Plan:   Post-operative Plan:   Informed Consent: I have reviewed the patients History and Physical, chart, labs and discussed the procedure including the risks, benefits and alternatives for the proposed anesthesia with the patient or authorized representative who has indicated his/her understanding and acceptance.     Plan Discussed with:   Anesthesia Plan Comments:         Anesthesia Quick Evaluation  

## 2013-05-24 ENCOUNTER — Encounter (HOSPITAL_COMMUNITY): Payer: Self-pay

## 2013-05-24 LAB — CBC
HCT: 31.8 % — ABNORMAL LOW (ref 36.0–46.0)
Hemoglobin: 10.7 g/dL — ABNORMAL LOW (ref 12.0–15.0)
MCH: 30.6 pg (ref 26.0–34.0)
MCHC: 33.6 g/dL (ref 30.0–36.0)
MCV: 90.9 fL (ref 78.0–100.0)
Platelets: 150 10*3/uL (ref 150–400)
RBC: 3.5 MIL/uL — AB (ref 3.87–5.11)
RDW: 13.8 % (ref 11.5–15.5)
WBC: 10.2 10*3/uL (ref 4.0–10.5)

## 2013-05-24 LAB — TYPE AND SCREEN
ABO/RH(D): O POS
Antibody Screen: NEGATIVE

## 2013-05-24 LAB — ABO/RH: ABO/RH(D): O POS

## 2013-05-24 NOTE — Progress Notes (Signed)
Post Partum Day 1 Subjective: no complaints  Objective: Blood pressure 105/67, pulse 88, temperature 98.9 F (37.2 C), temperature source Axillary, resp. rate 18, height 5\' 5"  (1.651 m), weight 180 lb (81.647 kg), last menstrual period 08/14/2012, SpO2 97.00%, unknown if currently breastfeeding.  Physical Exam:  General: alert and no distress Lochia: appropriate Uterine Fundus: firm Incision: healing well DVT Evaluation: No evidence of DVT seen on physical exam.   Recent Labs  05/23/13 0245 05/24/13 0556  HGB 12.3 10.7*  HCT 36.7 31.8*    Assessment/Plan: Plan for discharge tomorrow   LOS: 2 days   Chelsea Wallace 05/24/2013, 8:35 AM

## 2013-05-24 NOTE — Progress Notes (Signed)
Ur chart review completed.  

## 2013-05-24 NOTE — Anesthesia Postprocedure Evaluation (Signed)
  Anesthesia Post-op Note  Patient: Chelsea Wallace  Procedure(s) Performed: * No procedures listed *  Patient Location: Mother/Baby  Anesthesia Type:Epidural  Level of Consciousness: awake, alert , oriented and patient cooperative  Airway and Oxygen Therapy: Patient Spontanous Breathing  Post-op Pain: mild  Post-op Assessment: Patient's Cardiovascular Status Stable, Respiratory Function Stable, No headache, No backache, No residual numbness and No residual motor weakness  Post-op Vital Signs: stable  Last Vitals:  Filed Vitals:   05/24/13 0944  BP: 112/74  Pulse: 75  Temp: 36.8 C  Resp: 20    Complications: No apparent anesthesia complications

## 2013-05-24 NOTE — Lactation Note (Signed)
This note was copied from the chart of Chelsea Gaylord Shihleayah Koskinen. Lactation Consultation Note Initial consultation, baby 917 hours old, mom states baby has been sleepy since birth, and that breast feeding is starting to get a little better. Offered to assist with a feeding, mom accepts. Assisted mom to place baby STS; demonstrated hand expression; mom has very large drops colostrum easily expressed. Assisted to position and latch baby. Baby sucks on tongue and lower lip. With some practice, baby was able to maintain his latch with rhythmic sucking and audible swallowing. Discussed with mom and dad how to position baby for best latch.  Reviewed baby and me book breast feeding basics. Reviewed lactation brochure, community resources, and BFSG. Enc mom to call for assistance if she has any concerns.   Patient Name: Chelsea Wallace ZOXWR'UToday's Date: 05/24/2013 Reason for consult: Initial assessment   Maternal Data Formula Feeding for Exclusion: No Has patient been taught Hand Expression?: Yes Does the patient have breastfeeding experience prior to this delivery?: No  Feeding Feeding Type: Breast Fed Length of feed: 25 min  LATCH Score/Interventions Latch: Repeated attempts needed to sustain latch, nipple held in mouth throughout feeding, stimulation needed to elicit sucking reflex.  Audible Swallowing: A few with stimulation  Type of Nipple: Everted at rest and after stimulation  Comfort (Breast/Nipple): Soft / non-tender     Hold (Positioning): Assistance needed to correctly position infant at breast and maintain latch. Intervention(s): Breastfeeding basics reviewed;Support Pillows;Position options;Skin to skin  LATCH Score: 7  Lactation Tools Discussed/Used     Consult Status Consult Status: Follow-up Follow-up type: In-patient    Chelsea Wallace 05/24/2013, 1:53 PM

## 2013-05-25 ENCOUNTER — Encounter: Payer: Medicaid Other | Admitting: Obstetrics

## 2013-05-25 MED ORDER — IBUPROFEN 600 MG PO TABS: 600.0000 mg | ORAL_TABLET | Freq: Four times a day (QID) | ORAL | Status: DC | PRN

## 2013-05-25 MED ORDER — OXYCODONE-ACETAMINOPHEN 5-325 MG PO TABS: 1.0000 | ORAL_TABLET | ORAL | Status: DC | PRN

## 2013-05-25 NOTE — Discharge Instructions (Signed)

## 2013-05-25 NOTE — Progress Notes (Signed)
Post Partum Day 2 Subjective: no complaints  Objective: Blood pressure 90/58, pulse 83, temperature 98 F (36.7 C), temperature source Oral, resp. rate 18, height 5\' 5"  (1.651 m), weight 180 lb (81.647 kg), last menstrual period 08/14/2012, SpO2 97.00%, unknown if currently breastfeeding.  Physical Exam:  General: alert and no distress Lochia: appropriate Uterine Fundus: firm Incision: healing well DVT Evaluation: No evidence of DVT seen on physical exam.   Recent Labs  05/23/13 0245 05/24/13 0556  HGB 12.3 10.7*  HCT 36.7 31.8*    Assessment/Plan: Discharge home   LOS: 3 days   Brock Badharles A Harper 05/25/2013, 8:22 AM
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
## 2013-05-25 NOTE — Discharge Summary (Signed)
Obstetric Discharge Summary Reason for Admission: onset of labor Prenatal Procedures: ultrasound Intrapartum Procedures: spontaneous vaginal delivery Postpartum Procedures: none Complications-Operative and Postpartum: none Hemoglobin  Date Value Ref Range Status  05/24/2013 10.7* 12.0 - 15.0 g/dL Final     HCT  Date Value Ref Range Status  05/24/2013 31.8* 36.0 - 46.0 % Final    Physical Exam:  General: alert and no distress Lochia: appropriate Uterine Fundus: firm Incision: healing well DVT Evaluation: No evidence of DVT seen on physical exam.  Discharge Diagnoses: Term Pregnancy-delivered  Discharge Information: Date: 05/25/2013 Activity: pelvic rest Diet: routine Medications: PNV, Ibuprofen, Colace and Percocet Condition: stable Instructions: refer to practice specific booklet Discharge to: home Follow-up Information   Follow up with Chelsea Schoenherr A, MD In 2 weeks.   Specialty:  Obstetrics and Gynecology   Contact information:   [address] [place] [id number] [phone]       Newborn Data: Live born female  Birth Weight: 7 lb 9.5 oz (3445 g) APGAR: 8, 9  Home with mother.  Chelsea Wallace 05/25/2013, 8:25 AM

## 2013-06-01 ENCOUNTER — Encounter: Payer: Medicaid Other | Admitting: Obstetrics

## 2013-06-02 ENCOUNTER — Ambulatory Visit: Payer: Medicaid Other | Admitting: Obstetrics

## 2013-06-08 ENCOUNTER — Encounter: Payer: Medicaid Other | Admitting: Obstetrics

## 2013-06-17 ENCOUNTER — Encounter: Payer: Self-pay | Admitting: Obstetrics

## 2013-06-17 ENCOUNTER — Ambulatory Visit (INDEPENDENT_AMBULATORY_CARE_PROVIDER_SITE_OTHER): Payer: Medicaid Other | Admitting: Obstetrics

## 2013-06-17 DIAGNOSIS — Z3009 Encounter for other general counseling and advice on contraception: Secondary | ICD-10-CM

## 2013-06-17 MED ORDER — MEDROXYPROGESTERONE ACETATE 150 MG/ML IM SUSP: 150.0000 mg | INTRAMUSCULAR | Status: DC

## 2013-06-17 NOTE — Progress Notes (Signed)
Subjective:     Chelsea Wallace is a 23 y.o. female who presents for a postpartum visit. She is 3 weeks postpartum following a spontaneous vaginal delivery. I have fully reviewed the prenatal and intrapartum course. The delivery was at 40 gestational weeks. Outcome: spontaneous vaginal delivery. Anesthesia: epidural. Postpartum course has been normal. Baby's course has been normal. Baby is feeding by both breast and bottle Rush Barer. Bleeding thin lochia. Bowel function is normal. Bladder function is normal. Patient is not sexually active. Contraception method is Depo-Provera injections. Postpartum depression screening: negative.  The following portions of the patient's history were reviewed and updated as appropriate: allergies, current medications, past family history, past medical history, past social history, past surgical history and problem list.  Review of Systems A comprehensive review of systems was negative.   Objective:   PE:  Deferred.   BP 130/92  Pulse 77  Temp(Src) 98.2 F (36.8 C)  Ht 5\' 5"  (1.651 m)  Wt 159 lb (72.122 kg)  BMI 26.46 kg/m2  Breastfeeding? Yes    100% of 10 min visit spent on coordination of care. Assessment:    3 weeks postpartum.  Doing well.  Counseling for contraception.  Wants Depo Provera.  Plan:    1. Contraception: Depo-Provera injections 2. Depo Provera Rx. 3. Follow up in: 4 weeks or as needed.

## 2013-07-12 ENCOUNTER — Encounter: Payer: Medicaid Other | Admitting: Obstetrics

## 2013-07-15 ENCOUNTER — Encounter: Payer: Medicaid Other | Admitting: Obstetrics

## 2013-10-02 ENCOUNTER — Emergency Department (INDEPENDENT_AMBULATORY_CARE_PROVIDER_SITE_OTHER)
Admission: EM | Admit: 2013-10-02 | Discharge: 2013-10-02 | Disposition: A | Discharge status: Home / Self Care | Payer: Medicaid Other | Source: Home / Self Care

## 2013-10-02 ENCOUNTER — Encounter (HOSPITAL_COMMUNITY): Payer: Self-pay | Admitting: Emergency Medicine

## 2013-10-02 DIAGNOSIS — G44219 Episodic tension-type headache, not intractable: Secondary | ICD-10-CM

## 2013-10-02 LAB — POCT URINALYSIS DIP (DEVICE)
BILIRUBIN URINE: NEGATIVE
GLUCOSE, UA: NEGATIVE mg/dL
Hgb urine dipstick: NEGATIVE
Ketones, ur: NEGATIVE mg/dL
NITRITE: NEGATIVE
Protein, ur: NEGATIVE mg/dL
SPECIFIC GRAVITY, URINE: 1.025 (ref 1.005–1.030)
Urobilinogen, UA: 1 mg/dL (ref 0.0–1.0)
pH: 6 (ref 5.0–8.0)

## 2013-10-02 LAB — POCT PREGNANCY, URINE: Preg Test, Ur: NEGATIVE

## 2013-10-02 MED ORDER — ONDANSETRON 4 MG PO TBDP
4.0000 mg | ORAL_TABLET | Freq: Once | ORAL | Status: AC
  Administered 2013-10-02: 4 mg via ORAL

## 2013-10-02 MED ORDER — KETOROLAC TROMETHAMINE 60 MG/2ML IM SOLN
60.0000 mg | Freq: Once | INTRAMUSCULAR | Status: AC
  Administered 2013-10-02: 60 mg via INTRAMUSCULAR

## 2013-10-02 MED ORDER — ONDANSETRON HCL 4 MG PO TABS: 4.0000 mg | ORAL_TABLET | Freq: Four times a day (QID) | ORAL | Status: DC

## 2013-10-02 MED ORDER — ONDANSETRON 4 MG PO TBDP
ORAL_TABLET | ORAL | Status: AC
  Filled 2013-10-02: qty 1

## 2013-10-02 MED ORDER — KETOROLAC TROMETHAMINE 60 MG/2ML IM SOLN
INTRAMUSCULAR | Status: AC
  Filled 2013-10-02: qty 2

## 2013-10-02 MED ORDER — KETOROLAC TROMETHAMINE 10 MG PO TABS: 10.0000 mg | ORAL_TABLET | Freq: Four times a day (QID) | ORAL | Status: DC | PRN

## 2013-10-02 NOTE — Discharge Instructions (Signed)
Tension Headache A tension headache is pain, pressure, or aching felt over the front and sides of the head. Tension headaches often come after stress, feeling worried (anxiety), or feeling sad or down for a while (depressed). HOME CARE  Only take medicine as told by your doctor.  Lie down in a dark, quiet room when you have a headache.  Keep a journal to find out if certain things bring on headaches. For example, write down:  What you eat and drink.  How much sleep you get.  Any change to your diet or medicines.  Relax by getting a massage or doing other relaxing activities.  Put ice or heat packs on the head and neck area as told by your doctor.  Lessen stress.  Sit up straight. Do not tighten (tense) your muscles.  Quit smoking if you smoke.  Lessen how much alcohol you drink.  Lessen how much caffeine you drink, or stop drinking caffeine.  Eat and exercise regularly.  Get enough sleep.  Avoid using too much pain medicine. GET HELP RIGHT AWAY IF:   Your headache becomes really bad.  You have a fever.  You have a stiff neck.  You have trouble seeing.  Your muscles are weak, or you lose muscle control.  You lose your balance or have trouble walking.  You feel like you will pass out (faint), or you pass out.  You have really bad symptoms that are different than your first symptoms.  You have problems with the medicines given to you by your doctor.  Your medicines do not work.  Your headache feels different than the other headaches.  You feel sick to your stomach (nauseous) or throw up (vomit). MAKE SURE YOU:   Understand these instructions.  Will watch your condition.  Will get help right away if you are not doing well or get worse. Document Released: 03/27/2009 Document Revised: 03/25/2011 Document Reviewed: 12/21/2010 [facility] Patient Information 2015 [place], [place]. This information is not intended to replace advice given to you by your health  care provider. Make sure you discuss any questions you have with your health care provider.  Headaches, Frequently Asked Questions MIGRAINE HEADACHES Q: What is migraine? What causes it? How can I treat it? A: Generally, migraine headaches begin as a dull ache. Then they develop into a constant, throbbing, and pulsating pain. You may experience pain at the temples. You may experience pain at the front or back of one or both sides of the head. The pain is usually accompanied by a combination of:  Nausea.  Vomiting.  Sensitivity to light and noise. Some people (about 15%) experience an aura (see below) before an attack. The cause of migraine is believed to be chemical reactions in the brain. Treatment for migraine may include over-the-counter or prescription medications. It may also include self-help techniques. These include relaxation training and biofeedback.  Q: What is an aura? A: About 15% of people with migraine get an "aura". This is a sign of neurological symptoms that occur before a migraine headache. You may see wavy or jagged lines, dots, or flashing lights. You might experience tunnel vision or blind spots in one or both eyes. The aura can include visual or auditory hallucinations (something imagined). It may include disruptions in smell (such as strange odors), taste or touch. Other symptoms include:  Numbness.  A "pins and needles" sensation.  Difficulty in recalling or speaking the correct word. These neurological events may last as long as 60 minutes. These symptoms  will fade as the headache begins. Q: What is a trigger? A: Certain physical or environmental factors can lead to or "trigger" a migraine. These include:  Foods.  Hormonal changes.  Weather.  Stress. It is important to remember that triggers are different for everyone. To help prevent migraine attacks, you need to figure out which triggers affect you. Keep a headache diary. This is a good way to track  triggers. The diary will help you talk to your healthcare professional about your condition. Q: Does weather affect migraines? A: Bright sunshine, hot, humid conditions, and drastic changes in barometric pressure may lead to, or "trigger," a migraine attack in some people. But studies have shown that weather does not act as a trigger for everyone with migraines. Q: What is the link between migraine and hormones? A: Hormones start and regulate many of your body's functions. Hormones keep your body in balance within a constantly changing environment. The levels of hormones in your body are unbalanced at times. Examples are during menstruation, pregnancy, or menopause. That can lead to a migraine attack. In fact, about three quarters of all women with migraine report that their attacks are related to the menstrual cycle.  Q: Is there an increased risk of stroke for migraine sufferers? A: The likelihood of a migraine attack causing a stroke is very remote. That is not to say that migraine sufferers cannot have a stroke associated with their migraines. In persons under age 21, the most common associated factor for stroke is migraine headache. But over the course of a person's normal life span, the occurrence of migraine headache may actually be associated with a reduced risk of dying from cerebrovascular disease due to stroke.  Q: What are acute medications for migraine? A: Acute medications are used to treat the pain of the headache after it has started. Examples over-the-counter medications, NSAIDs, ergots, and triptans.  Q: What are the triptans? A: Triptans are the newest class of abortive medications. They are specifically targeted to treat migraine. Triptans are vasoconstrictors. They moderate some chemical reactions in the brain. The triptans work on receptors in your brain. Triptans help to restore the balance of a neurotransmitter called serotonin. Fluctuations in levels of serotonin are thought to be  a main cause of migraine.  Q: Are over-the-counter medications for migraine effective? A: Over-the-counter, or "OTC," medications may be effective in relieving mild to moderate pain and associated symptoms of migraine. But you should see your caregiver before beginning any treatment regimen for migraine.  Q: What are preventive medications for migraine? A: Preventive medications for migraine are sometimes referred to as "prophylactic" treatments. They are used to reduce the frequency, severity, and length of migraine attacks. Examples of preventive medications include antiepileptic medications, antidepressants, beta-blockers, calcium channel blockers, and NSAIDs (nonsteroidal anti-inflammatory drugs). Q: Why are anticonvulsants used to treat migraine? A: During the past few years, there has been an increased interest in antiepileptic drugs for the prevention of migraine. They are sometimes referred to as "anticonvulsants". Both epilepsy and migraine may be caused by similar reactions in the brain.  Q: Why are antidepressants used to treat migraine? A: Antidepressants are typically used to treat people with depression. They may reduce migraine frequency by regulating chemical levels, such as serotonin, in the brain.  Q: What alternative therapies are used to treat migraine? A: The term "alternative therapies" is often used to describe treatments considered outside the scope of conventional Western medicine. Examples of alternative therapy include acupuncture, acupressure, and yoga.  Another common alternative treatment is herbal therapy. Some herbs are believed to relieve headache pain. Always discuss alternative therapies with your caregiver before proceeding. Some herbal products contain arsenic and other toxins. TENSION HEADACHES Q: What is a tension-type headache? What causes it? How can I treat it? A: Tension-type headaches occur randomly. They are often the result of temporary stress, anxiety,  fatigue, or anger. Symptoms include soreness in your temples, a tightening band-like sensation around your head (a "vice-like" ache). Symptoms can also include a pulling feeling, pressure sensations, and contracting head and neck muscles. The headache begins in your forehead, temples, or the back of your head and neck. Treatment for tension-type headache may include over-the-counter or prescription medications. Treatment may also include self-help techniques such as relaxation training and biofeedback. CLUSTER HEADACHES Q: What is a cluster headache? What causes it? How can I treat it? A: Cluster headache gets its name because the attacks come in groups. The pain arrives with little, if any, warning. It is usually on one side of the head. A tearing or bloodshot eye and a runny nose on the same side of the headache may also accompany the pain. Cluster headaches are believed to be caused by chemical reactions in the brain. They have been described as the most severe and intense of any headache type. Treatment for cluster headache includes prescription medication and oxygen. SINUS HEADACHES Q: What is a sinus headache? What causes it? How can I treat it? A: When a cavity in the bones of the face and skull (a sinus) becomes inflamed, the inflammation will cause localized pain. This condition is usually the result of an allergic reaction, a tumor, or an infection. If your headache is caused by a sinus blockage, such as an infection, you will probably have a fever. An x-ray will confirm a sinus blockage. Your caregiver's treatment might include antibiotics for the infection, as well as antihistamines or decongestants.  REBOUND HEADACHES Q: What is a rebound headache? What causes it? How can I treat it? A: A pattern of taking acute headache medications too often can lead to a condition known as "rebound headache." A pattern of taking too much headache medication includes taking it more than 2 days per week or in  excessive amounts. That means more than the label or a caregiver advises. With rebound headaches, your medications not only stop relieving pain, they actually begin to cause headaches. Doctors treat rebound headache by tapering the medication that is being overused. Sometimes your caregiver will gradually substitute a different type of treatment or medication. Stopping may be a challenge. Regularly overusing a medication increases the potential for serious side effects. Consult a caregiver if you regularly use headache medications more than 2 days per week or more than the label advises. ADDITIONAL QUESTIONS AND ANSWERS Q: What is biofeedback? A: Biofeedback is a self-help treatment. Biofeedback uses special equipment to monitor your body's involuntary physical responses. Biofeedback monitors:  Breathing.  Pulse.  Heart rate.  Temperature.  Muscle tension.  Brain activity. Biofeedback helps you refine and perfect your relaxation exercises. You learn to control the physical responses that are related to stress. Once the technique has been mastered, you do not need the equipment any more. Q: Are headaches hereditary? A: Four out of five (80%) of people that suffer report a family history of migraine. Scientists are not sure if this is genetic or a family predisposition. Despite the uncertainty, a child has a 50% chance of having migraine if one parent  suffers. The child has a 75% chance if both parents suffer.  Q: Can children get headaches? A: By the time they reach high school, most young people have experienced some type of headache. Many safe and effective approaches or medications can prevent a headache from occurring or stop it after it has begun.  Q: What type of doctor should I see to diagnose and treat my headache? A: Start with your primary caregiver. Discuss his or her experience and approach to headaches. Discuss methods of classification, diagnosis, and treatment. Your caregiver may  decide to recommend you to a headache specialist, depending upon your symptoms or other physical conditions. Having diabetes, allergies, etc., may require a more comprehensive and inclusive approach to your headache. The National Head[employer] Foundation will provide, upon request, a list of [facility] physician members in your state. Document Released: 03/23/2003 Document Revised: 03/25/2011 Document Reviewed: 08/31/2007 [facility] Patient Information 2015 [place], [place]. This information is not intended to replace advice given to you by your health care provider. Make sure you discuss any questions you have with your health care provider.

## 2013-10-02 NOTE — ED Provider Notes (Signed)
CSN: [id number]     Arrival date & time 10/02/13  1735 History   First MD Initiated Contact with Patient 10/02/13 1748     No chief complaint on file.  (Consider location/radiation/quality/duration/timing/severity/associated sxs/prior Treatment) HPI Comments: Headache for 3 d. Occasional dizziness and nausea. No head trauma. No hx of headaches. Sharp and throbbing pain in the frontal and bitemporal areas.  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    Past Medical History  Diagnosis Date  . GERD (gastroesophageal reflux disease)    Past Surgical History  Procedure Laterality Date  . Mandible reconstruction      lower jaw surgery 2011   Family History  Problem Relation Age of Onset  . Diabetes Mother   . Diabetes Maternal Grandfather   . Hypertension Paternal Grandmother   . Hypertension Paternal Grandfather    History  Substance Use Topics  . Smoking status: Never Smoker   . Smokeless tobacco: Never Used  . Alcohol Use: No   OB History   Grav Para Term Preterm Abortions TAB SAB Ect Mult Living   Review of Systems  Constitutional: Negative for fever, activity change and fatigue.  HENT: Negative for congestion, ear discharge, ear pain, hearing loss, rhinorrhea, sinus pressure, sore throat and trouble swallowing.   Eyes: Negative for photophobia, pain, redness and visual disturbance.  Respiratory: Negative.   Cardiovascular: Negative for chest pain and palpitations.  Gastrointestinal: Positive for nausea. Negative for vomiting and abdominal pain.  Genitourinary: Negative.   Musculoskeletal: Negative.   Skin: Negative for pallor.  Neurological: Positive for dizziness and headaches. Negative for tremors, seizures, syncope, facial asymmetry, speech difficulty, weakness and numbness.  Psychiatric/Behavioral: Negative.     Allergies  Review of patient's allergies indicates no known allergies.  Home Medications   Prior to  Admission medications   Medication Sig Start Date End Date Taking? Authorizing Provider  ketorolac (TORADOL) 10 MG tablet Take 1 tablet (10 mg total) by mouth every 6 (six) hours as needed. For headache. Take with food. 10/02/13   Hayden Rasmussen, NP  medroxyPROGESTERone (DEPO-PROVERA) 150 MG/ML injection Inject 1 mL (150 mg total) into the muscle every 3 (three) months. 06/17/13   Brock Bad, MD  ondansetron (ZOFRAN) 4 MG tablet Take 1 tablet (4 mg total) by mouth every 6 (six) hours. 10/02/13   Hayden Rasmussen, NP  Prenatal Vit-Fe Fum-FA-Omega (ONE-A-DAY WOMENS PRENATAL) 28-0.8 & 440 MG MISC Take 1 tablet by mouth daily.     Historical Provider, MD   BP 129/79  Pulse 78  Temp(Src) 98.8 F (37.1 C) (Oral)  Resp 16 Physical Exam  Nursing note and vitals reviewed. Constitutional: She is oriented to person, place, and time. She appears well-developed and well-nourished. No distress.  HENT:  Head: Normocephalic and atraumatic.  Mouth/Throat: Oropharynx is clear and moist. No oropharyngeal exudate.  Moderate tenderness to the frontal and temporo-parietal scalp. No facial tenderness.   Eyes: Conjunctivae and EOM are normal. Pupils are equal, round, and reactive to light.  Neck: Normal range of motion. Neck supple. No thyromegaly present.  Cardiovascular: Normal rate, regular rhythm and intact distal pulses.   Murmur heard. Pulmonary/Chest: Breath sounds normal. No respiratory distress. She has no wheezes. She has no rales.  Musculoskeletal: She exhibits no edema and no tenderness.  Lymphadenopathy:    She has no cervical adenopathy.  Neurological: She is alert and oriented to  person, place, and time. She has normal strength. No cranial nerve deficit or sensory deficit. She exhibits normal muscle tone. Coordination and gait normal. GCS eye subscore is 4. GCS verbal subscore is 5. GCS motor subscore is 6.    ED Course  Procedures (including critical care time) Labs Review Labs Reviewed  POCT  URINALYSIS DIP (DEVICE) - Abnormal; Notable for the following:    Leukocytes, UA SMALL (*)    All other components within normal limits  POCT PREGNANCY, URINE   Results for orders placed during the hospital encounter of 10/02/13  POCT URINALYSIS DIP (DEVICE)      Result Value Ref Range   Glucose, UA NEGATIVE  NEGATIVE mg/dL   Bilirubin Urine NEGATIVE  NEGATIVE   Ketones, ur NEGATIVE  NEGATIVE mg/dL   Specific Gravity, Urine 1.025  1.005 - 1.030   Hgb urine dipstick NEGATIVE  NEGATIVE   pH 6.0  5.0 - 8.0   Protein, ur NEGATIVE  NEGATIVE mg/dL   Urobilinogen, UA 1.0  0.0 - 1.0 mg/dL   Nitrite NEGATIVE  NEGATIVE   Leukocytes, UA SMALL (*) NEGATIVE  POCT PREGNANCY, URINE      Result Value Ref Range   Preg Test, Ur NEGATIVE  NEGATIVE    Imaging Review No results found.   MDM   1. Episodic tension-type headache, not intractable    toradol 60 mg IM and 10 mg tablets per rx prn H/A Zofran 4 mg po now and Rx prn nausea Limit time at computer screen if possible Heat or ice to scalp. See PCP as needed.  pregnancy test negative, she is NOT breast feeding.     Hayden Rasmussen, NP 10/02/13 1815

## 2013-10-02 NOTE — ED Notes (Signed)
C/o HA w nausea since 9-16, left work early today because of pain; NAD

## 2013-10-04 ENCOUNTER — Emergency Department (INDEPENDENT_AMBULATORY_CARE_PROVIDER_SITE_OTHER)
Admission: EM | Admit: 2013-10-04 | Discharge: 2013-10-04 | Disposition: A | Discharge status: Home / Self Care | Payer: Medicaid Other | Source: Home / Self Care | Attending: Family Medicine | Admitting: Family Medicine

## 2013-10-04 ENCOUNTER — Encounter (HOSPITAL_COMMUNITY): Payer: Self-pay | Admitting: Emergency Medicine

## 2013-10-04 DIAGNOSIS — R21 Rash and other nonspecific skin eruption: Secondary | ICD-10-CM

## 2013-10-04 DIAGNOSIS — R519 Headache, unspecified: Secondary | ICD-10-CM

## 2013-10-04 DIAGNOSIS — R51 Headache: Secondary | ICD-10-CM

## 2013-10-04 MED ORDER — DEXAMETHASONE SODIUM PHOSPHATE 10 MG/ML IJ SOLN
INTRAMUSCULAR | Status: AC
  Filled 2013-10-04: qty 1

## 2013-10-04 MED ORDER — DEXAMETHASONE SODIUM PHOSPHATE 10 MG/ML IJ SOLN
10.0000 mg | Freq: Once | INTRAMUSCULAR | Status: AC
  Administered 2013-10-04: 10 mg via INTRAMUSCULAR

## 2013-10-04 MED ORDER — SUMATRIPTAN SUCCINATE 6 MG/0.5ML ~~LOC~~ SOLN
6.0000 mg | Freq: Once | SUBCUTANEOUS | Status: AC
  Administered 2013-10-04: 6 mg via SUBCUTANEOUS

## 2013-10-04 MED ORDER — HYDROXYZINE HCL 25 MG PO TABS: 25.0000 mg | ORAL_TABLET | Freq: Three times a day (TID) | ORAL | Status: DC | PRN

## 2013-10-04 MED ORDER — SUMATRIPTAN SUCCINATE 6 MG/0.5ML ~~LOC~~ SOLN
SUBCUTANEOUS | Status: AC
  Filled 2013-10-04: qty 0.5

## 2013-10-04 NOTE — ED Provider Notes (Signed)
CSN: [id number]     Arrival date & time 10/04/13  1212 History   First MD Initiated Contact with Patient 10/04/13 1305     Chief Complaint  Patient presents with  . Headache  . Rash   (Consider location/radiation/quality/duration/timing/severity/associated sxs/prior Treatment) HPI  Seen for HA on 10/02/13 and given Toradol  IM in office adn Rx for oral toradol. Recommended to decrease screen time since that time as this makes her HA worse.  Pt states that the toradol injection did not help. Since being seen in urgent care HA continues to be bitemporal, throbbing, constant. Photophobia and phonophobia, and Nausea. Getting worse. Denies loss of bowel or bladder function, syncope. No h/o migraines. Ongoing now for 5 days. Does not worsen w/ eating. Laying down in dark room w/ some relief.  No medication changes in past 4 wks. 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 Prolonged screen time makes HA worse.   Rash: R upper back. Present for a few weeks. Itchy. Has had at othe times in various parts of her UE and chest. Typically last for a few days and then self resolve. Started several weeks ago. Rubbing ETOH w/o benefit. Denies any recent travel, new bedding, new furniture. Son w/ same rash.    Past Medical History  Diagnosis Date  . GERD (gastroesophageal reflux disease)    Past Surgical History  Procedure Laterality Date  . Mandible reconstruction      lower jaw surgery 2011   Family History  Problem Relation Age of Onset  . Diabetes Mother   . Diabetes Maternal Grandfather   . Hypertension Paternal Grandmother   . Hypertension Paternal Grandfather    History  Substance Use Topics  . Smoking status: Never Smoker   . Smokeless tobacco: Never Used  . Alcohol Use: No   OB History   Grav Para Term Preterm Abortions TAB SAB Ect Mult Living   Review of Systems Per HPI with all other pertinent systems negative.   Allergies  Review of patient's allergies  indicates no known allergies.  Home Medications   Prior to Admission medications   Medication Sig Start Date End Date Taking? Authorizing Provider  ondansetron (ZOFRAN) 4 MG tablet Take 1 tablet (4 mg total) by mouth every 6 (six) hours. 10/02/13  Yes Hayden Rasmussen, NP  hydrOXYzine (ATARAX/VISTARIL) 25 MG tablet Take 1-2 tablets (25-50 mg total) by mouth 3 (three) times daily as needed. 10/04/13   Ozella Rocks, MD  ketorolac (TORADOL) 10 MG tablet Take 1 tablet (10 mg total) by mouth every 6 (six) hours as needed. For headache. Take with food. 10/02/13   Hayden Rasmussen, NP  medroxyPROGESTERone (DEPO-PROVERA) 150 MG/ML injection Inject 1 mL (150 mg total) into the muscle every 3 (three) months. 06/17/13   Brock Bad, MD  Prenatal Vit-Fe Fum-FA-Omega (ONE-A-DAY WOMENS PRENATAL) 28-0.8 & 440 MG MISC Take 1 tablet by mouth daily.     Historical Provider, MD   BP 120/85  Pulse 62  Temp(Src) 98.1 F (36.7 C) (Oral)  Resp 12  SpO2 100% Physical Exam  Constitutional: She is oriented to person, place, and time. She appears well-developed and well-nourished. No distress.  HENT:  Head: Normocephalic and atraumatic.  Eyes: Pupils are equal, round, and reactive to light.  Neck: Normal range of motion.  Cardiovascular: Normal rate, normal heart sounds and intact distal pulses.   Pulmonary/Chest: Effort normal.  Abdominal:  Soft. Bowel sounds are normal.  Musculoskeletal: Normal range of motion.  Neurological: She is alert and oriented to person, place, and time. No cranial nerve deficit. She exhibits normal muscle tone. Coordination normal.  Skin: Skin is warm. She is not diaphoretic.  Single R upper back macular lesion  Psychiatric: She has a normal mood and affect. Her behavior is normal. Judgment and thought content normal.    ED Course  Procedures (including critical care time) Labs Review Labs Reviewed - No data to display  Imaging Review No results found.   MDM   1. Nonintractable  headache, unspecified chronicity pattern, unspecified headache type   2. Rash    HA likely migraine.  Decadron  IM and imitrex  [place] in office If not resolving ED f/u w/ head CT  Rash: likely from bed bugs - vistaril.  Handout on bed bugs given  Precautions given and all questions answered  Shelly Flatten, MD Family Medicine 10/04/2013, 2:51 PM     Ozella Rocks, MD 10/04/13 1451

## 2013-10-04 NOTE — Discharge Instructions (Signed)
Your headache is still likely due to a migraine This was treated with a steroid injection and migraine medicine today Please use  of tylenol every 8 hours and ibuprofen 600-800mg  every 6-8 hours Please start using the vistaril at home for the rash which is likely from bed bugs Remember to check all the bedding in the house Please go tot he emergency room if your headache does not improve  Bedbugs Bedbugs are tiny bugs that live in and around beds. During the day, they hide in mattresses and other places near beds. They come out at night and bite people lying in bed. They need blood to live and grow. Bedbugs can be found in beds anywhere. Usually, they are found in places where many people come and go (hotels, shelters, hospitals). It does not matter whether the place is dirty or clean. Getting bitten by bedbugs rarely causes a medical problem. The biggest problem can be getting rid of them. This often takes the work of a Oncologist. CAUSES  Less use of pesticides. Bedbugs were common before the 1950s. Then, strong pesticides such as DDT nearly wiped them out. Today, these pesticides are not used because they harm the environment and can cause health problems.  More travel. Besides mattresses, bedbugs can also live in clothing and luggage. They can come along as people travel from place to place. Bedbugs are more common in certain parts of the world. When people travel to those areas, the bugs can come home with them.  Presence of birds and bats. Bedbugs often infest birds and bats. If you have these animals in or near your home, bedbugs may infest your house, too. SYMPTOMS It does not hurt to be bitten by a bedbug. You will probably not wake up when you are bitten. Bedbugs usually bite areas of the skin that are not covered. Symptoms may show when you wake up, or they may take a day or more to show up. Symptoms may include:  Small red bumps on the skin. These might be lined up in  a row or clustered in a group.  A darker red dot in the middle of red bumps.  Blisters on the skin. There may be swelling and very bad itching. These may be signs of an allergic reaction. This does not happen often. DIAGNOSIS Bedbug bites might look and feel like other types of insect bites. The bugs do not stay on the body like ticks or lice. They bite, drop off, and crawl away to hide. Your caregiver will probably:  Ask about your symptoms.  Ask about your recent activities and travel.  Check your skin for bedbug bites.  Ask you to check at home for signs of bedbugs. You should look for:  Spots or stains on the bed or nearby. This could be from bedbugs that were crushed or from their eggs or waste.  Bedbugs themselves. They are reddish-brown, oval, and flat. They do not fly. They are about the size of an apple seed.  Places to look for bedbugs include:  Beds. Check mattresses, headboards, box springs, and bed frames.  On drapes and curtains near the bed.  Under carpeting in the bedroom.  Behind electrical outlets.  Behind any wallpaper that is peeling.  Inside luggage. TREATMENT Most bedbug bites do not need treatment. They usually go away on their own in a few days. The bites are not dangerous. However, treatment may be needed if you have scratched so much that your skin has  become infected. You may also need treatment if you are allergic to bedbug bites. Treatment options include:  A drug that stops swelling and itching (corticosteroid). Usually, a cream is rubbed on the skin. If you have a bad rash, you may be given a corticosteroid pill.  Oral antihistamines. These are pills to help control itching.  Antibiotic medicines. An antibiotic may be prescribed for infected skin. HOME CARE INSTRUCTIONS   Take any medicine prescribed by your caregiver for your bites. Follow the directions carefully.  Consider wearing pajamas with long sleeves and pant legs.  Your bedroom  may need to be treated. A pest control expert should make sure the bedbugs are gone. You may need to throw away mattresses or luggage. Ask the pest control expert what you can do to keep the bedbugs from coming back. Common suggestions include:  Putting a plastic cover over your mattress.  Washing and drying your clothes and bedding in hot water and a hot dryer. The temperature should be hotter than 120 F (48.9 C). Bedbugs are killed by high temperatures.  Vacuuming carefully all around your bed. Vacuum in all cracks and crevices where the bugs might hide. Do this often.  Carefully checking all used furniture, bedding, or clothes that you bring into your house.  Eliminating bird nests and bat roosts.  If you get bedbug bites when traveling, check all your possessions carefully before bringing them into your house. If you find any bugs on clothes or in your luggage, consider throwing those items away. SEEK MEDICAL CARE IF:  You have red bug bites that keep coming back.  You have red bug bites that itch badly.  You have bug bites that cause a skin rash.  You have scratch marks that are red and sore. SEEK IMMEDIATE MEDICAL CARE IF: You have a fever. Document Released: 02/02/2010 Document Revised: 03/25/2011 Document Reviewed: 02/02/2010 [facility] Patient Information 2015 [place], [place]. This information is not intended to replace advice given to you by your health care provider. Make sure you discuss any questions you have with your health care provider.

## 2013-10-04 NOTE — ED Notes (Signed)
Reports being seen here on Saturday for a headache.  States no relief from treatment given.  "I still feel the same".   Also c/o rash on arms, around ankles, and thigh.  Mild irritation.

## 2013-10-05 NOTE — ED Provider Notes (Signed)
Medical screening examination/treatment/procedure(s) were performed by resident physician or non-physician practitioner and as supervising physician I was immediately available for consultation/collaboration.   Iokepa Geffre DOUGLAS MD.   Avrianna Smart D Louvina Cleary, MD 10/05/13 1647 

## 2013-11-15 ENCOUNTER — Encounter (HOSPITAL_COMMUNITY): Payer: Self-pay | Admitting: Emergency Medicine

## 2014-01-10 ENCOUNTER — Encounter: Payer: Self-pay | Admitting: *Deleted

## 2014-01-15 ENCOUNTER — Emergency Department (INDEPENDENT_AMBULATORY_CARE_PROVIDER_SITE_OTHER)
Admission: EM | Admit: 2014-01-15 | Discharge: 2014-01-15 | Disposition: A | Discharge status: Home / Self Care | Payer: Medicaid Other | Source: Home / Self Care | Attending: Family Medicine | Admitting: Family Medicine

## 2014-01-15 ENCOUNTER — Encounter (HOSPITAL_COMMUNITY): Payer: Self-pay | Admitting: Emergency Medicine

## 2014-01-15 DIAGNOSIS — G44229 Chronic tension-type headache, not intractable: Secondary | ICD-10-CM

## 2014-01-15 LAB — POCT I-STAT, CHEM 8
BUN: 14 mg/dL (ref 6–23)
Calcium, Ion: 1.23 mmol/L (ref 1.12–1.23)
Chloride: 105 mEq/L (ref 96–112)
Creatinine, Ser: 0.7 mg/dL (ref 0.50–1.10)
Glucose, Bld: 96 mg/dL (ref 70–99)
HEMATOCRIT: 42 % (ref 36.0–46.0)
Hemoglobin: 14.3 g/dL (ref 12.0–15.0)
Potassium: 4 mmol/L (ref 3.5–5.1)
SODIUM: 141 mmol/L (ref 135–145)
TCO2: 22 mmol/L (ref 0–100)

## 2014-01-15 LAB — POCT PREGNANCY, URINE: PREG TEST UR: NEGATIVE

## 2014-01-15 MED ORDER — TRAZODONE HCL 50 MG PO TABS: 50.0000 mg | ORAL_TABLET | Freq: Every day | ORAL | Status: DC

## 2014-01-15 NOTE — ED Provider Notes (Signed)
CSN: [id number]     Arrival date & time 01/15/14  1246 History   First MD Initiated Contact with Patient 01/15/14 1258     Chief Complaint  Patient presents with  . Migraine   (Consider location/radiation/quality/duration/timing/severity/associated sxs/prior Treatment) Patient is a 24 y.o. female presenting with migraines. The history is provided by the patient.  Migraine This is a new problem. The current episode started more than 1 week ago. Associated symptoms include headaches. Pertinent negatives include no chest pain and no abdominal pain. Associated symptoms comments: Sleeping well, reports job stress is likely causative..    Past Medical History  Diagnosis Date  . GERD (gastroesophageal reflux disease)    Past Surgical History  Procedure Laterality Date  . Mandible reconstruction      lower jaw surgery 2011   Family History  Problem Relation Age of Onset  . Diabetes Mother   . Diabetes Maternal Grandfather   . Hypertension Paternal Grandmother   . Hypertension Paternal Grandfather    History  Substance Use Topics  . Smoking status: Never Smoker   . Smokeless tobacco: Never Used  . Alcohol Use: No   OB History    Gravida Para Term Preterm AB TAB SAB Ectopic Multiple Living   Review of Systems  Constitutional: Negative.   Eyes: Negative.   Respiratory: Negative.   Cardiovascular: Negative for chest pain.  Gastrointestinal: Negative.  Negative for abdominal pain.  Genitourinary: Negative for menstrual problem and pelvic pain.  Neurological: Positive for dizziness and headaches. Negative for weakness, light-headedness and numbness.    Allergies  Review of patient's allergies indicates no known allergies.  Home Medications   Prior to Admission medications   Medication Sig Start Date End Date Taking? Authorizing Provider  hydrOXYzine (ATARAX/VISTARIL) 25 MG tablet Take 1-2 tablets (25-50 mg total) by mouth 3 (three) times daily as needed.  10/04/13   Ozella Rocks, MD  ketorolac (TORADOL) 10 MG tablet Take 1 tablet (10 mg total) by mouth every 6 (six) hours as needed. For headache. Take with food. 10/02/13   Hayden Rasmussen, NP  medroxyPROGESTERone (DEPO-PROVERA) 150 MG/ML injection Inject 1 mL (150 mg total) into the muscle every 3 (three) months. 06/17/13   Brock Bad, MD  ondansetron (ZOFRAN) 4 MG tablet Take 1 tablet (4 mg total) by mouth every 6 (six) hours. 10/02/13   Hayden Rasmussen, NP  Prenatal Vit-Fe Fum-FA-Omega (ONE-A-DAY WOMENS PRENATAL) 28-0.8 & 440 MG MISC Take 1 tablet by mouth daily.     Historical Provider, MD  traZODone (DESYREL) 50 MG tablet Take 1 tablet (50 mg total) by mouth at bedtime. 01/15/14   Linna Hoff, MD   BP 116/76 mmHg  Pulse 74  Temp(Src) 98.4 F (36.9 C) (Oral)  Resp 16  SpO2 100%  LMP 12/31/2013 (Exact Date)  Breastfeeding? No Physical Exam  Constitutional: She is oriented to person, place, and time. She appears well-developed and well-nourished. No distress.  HENT:  Head: Normocephalic.  Right Ear: External ear normal.  Left Ear: External ear normal.  Mouth/Throat: Oropharynx is clear and moist.  Eyes: Conjunctivae and EOM are normal. Pupils are equal, round, and reactive to light.  Neck: Normal range of motion. Neck supple.  Cardiovascular: Normal heart sounds.   Pulmonary/Chest: Breath sounds normal.  Abdominal: Soft. Bowel sounds are normal.  Musculoskeletal: Normal range of motion. She exhibits no tenderness.  Lymphadenopathy:    She has  no cervical adenopathy.  Neurological: She is alert and oriented to person, place, and time. She displays normal reflexes. She exhibits normal muscle tone. Coordination normal.  Skin: Skin is warm and dry.  Nursing note and vitals reviewed.   ED Course  Procedures (including critical care time) Labs Review Labs Reviewed  POCT PREGNANCY, URINE  POCT I-STAT, CHEM 8   i-stat wnl., upreg neg. Imaging Review No results found.   MDM   1.  Chronic tension-type headache, not intractable        Linna Hoff, MD 01/15/14 1400

## 2014-01-15 NOTE — Discharge Instructions (Signed)
Use medicine as prescribed, see your doctor if further problems. °

## 2014-01-15 NOTE — ED Notes (Signed)
C/o  Possible migraine headache.   Symptoms present since last Saturday.   States "pain is sharp and constant"  Sensitivity to light and sound.  Nausea.   No relief with otc Excedrin.

## 2014-02-21 ENCOUNTER — Encounter (HOSPITAL_COMMUNITY): Payer: Self-pay | Admitting: Emergency Medicine

## 2014-02-21 ENCOUNTER — Emergency Department (HOSPITAL_COMMUNITY)
Admission: EM | Admit: 2014-02-21 | Discharge: 2014-02-21 | Discharge status: Against Medical Advice | Payer: Medicaid Other | Attending: Emergency Medicine | Admitting: Emergency Medicine

## 2014-02-21 DIAGNOSIS — N939 Abnormal uterine and vaginal bleeding, unspecified: Secondary | ICD-10-CM | POA: Insufficient documentation

## 2014-02-21 LAB — CBC WITH DIFFERENTIAL/PLATELET
Basophils Absolute: 0 10*3/uL (ref 0.0–0.1)
Basophils Relative: 0 % (ref 0–1)
EOS ABS: 0 10*3/uL (ref 0.0–0.7)
EOS PCT: 1 % (ref 0–5)
HEMATOCRIT: 38.6 % (ref 36.0–46.0)
HEMOGLOBIN: 13 g/dL (ref 12.0–15.0)
Lymphocytes Relative: 55 % — ABNORMAL HIGH (ref 12–46)
Lymphs Abs: 2.2 10*3/uL (ref 0.7–4.0)
MCH: 30.1 pg (ref 26.0–34.0)
MCHC: 33.7 g/dL (ref 30.0–36.0)
MCV: 89.4 fL (ref 78.0–100.0)
Monocytes Absolute: 0.3 10*3/uL (ref 0.1–1.0)
Monocytes Relative: 7 % (ref 3–12)
Neutro Abs: 1.5 10*3/uL — ABNORMAL LOW (ref 1.7–7.7)
Neutrophils Relative %: 37 % — ABNORMAL LOW (ref 43–77)
PLATELETS: 204 10*3/uL (ref 150–400)
RBC: 4.32 MIL/uL (ref 3.87–5.11)
RDW: 12.7 % (ref 11.5–15.5)
WBC: 4 10*3/uL (ref 4.0–10.5)

## 2014-02-21 LAB — URINALYSIS, ROUTINE W REFLEX MICROSCOPIC
Bilirubin Urine: NEGATIVE
GLUCOSE, UA: NEGATIVE mg/dL
Ketones, ur: NEGATIVE mg/dL
NITRITE: NEGATIVE
Protein, ur: NEGATIVE mg/dL
Specific Gravity, Urine: 1.033 — ABNORMAL HIGH (ref 1.005–1.030)
Urobilinogen, UA: 1 mg/dL (ref 0.0–1.0)
pH: 7.5 (ref 5.0–8.0)

## 2014-02-21 LAB — URINE MICROSCOPIC-ADD ON

## 2014-02-21 LAB — COMPREHENSIVE METABOLIC PANEL
ALK PHOS: 91 U/L (ref 39–117)
ALT: 15 U/L (ref 0–35)
AST: 18 U/L (ref 0–37)
Albumin: 4.3 g/dL (ref 3.5–5.2)
Anion gap: 6 (ref 5–15)
BUN: 16 mg/dL (ref 6–23)
CALCIUM: 9.5 mg/dL (ref 8.4–10.5)
CHLORIDE: 107 mmol/L (ref 96–112)
CO2: 27 mmol/L (ref 19–32)
Creatinine, Ser: 0.71 mg/dL (ref 0.50–1.10)
GFR calc non Af Amer: >90 mL/min (ref >90)
GLUCOSE: 81 mg/dL (ref 70–99)
POTASSIUM: 3.8 mmol/L (ref 3.5–5.1)
SODIUM: 140 mmol/L (ref 135–145)
Total Bilirubin: 0.7 mg/dL (ref 0.3–1.2)
Total Protein: 7.4 g/dL (ref 6.0–8.3)

## 2014-02-21 NOTE — ED Notes (Signed)
Pt sts period was a couple of days late and now having heavy bleeding with some pain; pt sts worse pain with urination

## 2014-02-21 NOTE — ED Notes (Signed)
Registration saw the patient leave.  She was called three times and did not answer.

## 2014-02-22 ENCOUNTER — Telehealth: Payer: Self-pay | Admitting: *Deleted

## 2014-02-22 NOTE — Telephone Encounter (Signed)
Patient states she went to the hospital yesterday for irregular cycle- but left before being seen because she had been there for over 4 hours. Patient delivered last summer and had 1 depo provera injection. She states she started having regular monthly cycles after the shot wore off. Patient states her cycle usually comes mid month around the 18th and it has been regular for at least 3 months. Patient states her last sexual intercourse was in December and she has had a negative pregnancy test since that. Patient states her cycles started late 1/29 and she bled for 4 days then stopped. She restarted this week end 2/6 and states she is bleeding heavy. Appointment for evaluation given with provider.

## 2014-02-23 ENCOUNTER — Ambulatory Visit: Payer: Medicaid Other | Admitting: Certified Nurse Midwife

## 2014-05-30 ENCOUNTER — Encounter (HOSPITAL_COMMUNITY): Payer: Self-pay | Admitting: *Deleted

## 2014-05-30 ENCOUNTER — Inpatient Hospital Stay (HOSPITAL_COMMUNITY)
Admission: AD | Admit: 2014-05-30 | Discharge: 2014-05-30 | Disposition: A | Discharge status: Home / Self Care | Payer: Medicaid Other | Source: Ambulatory Visit | Attending: Family Medicine | Admitting: Family Medicine

## 2014-05-30 DIAGNOSIS — O210 Mild hyperemesis gravidarum: Secondary | ICD-10-CM | POA: Insufficient documentation

## 2014-05-30 DIAGNOSIS — Z3A10 10 weeks gestation of pregnancy: Secondary | ICD-10-CM | POA: Insufficient documentation

## 2014-05-30 DIAGNOSIS — O9989 Other specified diseases and conditions complicating pregnancy, childbirth and the puerperium: Secondary | ICD-10-CM | POA: Diagnosis present

## 2014-05-30 LAB — URINALYSIS, ROUTINE W REFLEX MICROSCOPIC
Bilirubin Urine: NEGATIVE
Glucose, UA: NEGATIVE mg/dL
Hgb urine dipstick: NEGATIVE
Ketones, ur: NEGATIVE mg/dL
LEUKOCYTES UA: NEGATIVE
Nitrite: NEGATIVE
PROTEIN: NEGATIVE mg/dL
Specific Gravity, Urine: 1.02 (ref 1.005–1.030)
UROBILINOGEN UA: 1 mg/dL (ref 0.0–1.0)
pH: 7 (ref 5.0–8.0)

## 2014-05-30 LAB — URINALYSIS, DIPSTICK ONLY
Bilirubin Urine: NEGATIVE
GLUCOSE, UA: NEGATIVE mg/dL
Hgb urine dipstick: NEGATIVE
KETONES UR: 15 mg/dL — AB
LEUKOCYTES UA: NEGATIVE
Nitrite: NEGATIVE
PH: 7 (ref 5.0–8.0)
Protein, ur: NEGATIVE mg/dL
SPECIFIC GRAVITY, URINE: 1.015 (ref 1.005–1.030)
Urobilinogen, UA: 1 mg/dL (ref 0.0–1.0)

## 2014-05-30 LAB — POCT PREGNANCY, URINE: Preg Test, Ur: POSITIVE — AB

## 2014-05-30 MED ORDER — PROMETHAZINE HCL 12.5 MG PO TABS: 12.5000 mg | ORAL_TABLET | ORAL | Status: DC | PRN

## 2014-05-30 NOTE — MAU Provider Note (Signed)
History    G2P1 @ apx 10 wks per LMP states periods have not been regular, in with c/o nausea worse of the morning not as bad in the p.m. Denies vag bleeding or abd pain. Will see Dr. Chipper OmanHarpwer for care. CSN: [id number]  Arrival date and time: 05/30/14 1727   None     Chief Complaint  Patient presents with  . Possible Pregnancy   HPI  OB History    Gravida Para Term Preterm AB TAB SAB Ectopic Multiple Living   2 1 1       1       Past Medical History  Diagnosis Date  . GERD (gastroesophageal reflux disease)     Past Surgical History  Procedure Laterality Date  . Mandible reconstruction      lower jaw surgery 2011    Family History  Problem Relation Age of Onset  . Diabetes Mother   . Diabetes Maternal Grandfather   . Hypertension Paternal Grandmother   . Hypertension Paternal Grandfather     History  Substance Use Topics  . Smoking status: Never Smoker   . Smokeless tobacco: Never Used  . Alcohol Use: No    Allergies: No Known Allergies  Prescriptions prior to admission  Medication Sig Dispense Refill Last Dose  . hydrOXYzine (ATARAX/VISTARIL) 25 MG tablet Take 1-2 tablets (25-50 mg total) by mouth 3 (three) times daily as needed. (Patient not taking: Reported on 02/21/2014) 30 tablet 0 Not Taking at Unknown time  . ketorolac (TORADOL) 10 MG tablet Take 1 tablet (10 mg total) by mouth every 6 (six) hours as needed. For headache. Take with food. (Patient not taking: Reported on 02/21/2014) 16 tablet 0 Not Taking at Unknown time  . medroxyPROGESTERone (DEPO-PROVERA) 150 MG/ML injection Inject 1 mL (150 mg total) into the muscle every 3 (three) months. (Patient not taking: Reported on 02/21/2014) 1 mL 0 Not Taking at Unknown time  . ondansetron (ZOFRAN) 4 MG tablet Take 1 tablet (4 mg total) by mouth every 6 (six) hours. (Patient not taking: Reported on 02/21/2014) 12 tablet 0 Not Taking at Unknown time  . traZODone (DESYREL) 50 MG tablet Take 1 tablet (50 mg total) by mouth  at bedtime. (Patient not taking: Reported on 02/21/2014) 30 tablet 0 Not Taking at Unknown time    Review of Systems  Constitutional: Negative.   HENT: Negative.   Eyes: Negative.   Respiratory: Negative.   Cardiovascular: Negative.   Gastrointestinal: Positive for nausea and vomiting.  Genitourinary: Negative.   Musculoskeletal: Negative.   Skin: Negative.   Neurological: Negative.   Endo/Heme/Allergies: Negative.   Psychiatric/Behavioral: Negative.    Physical Exam   Blood pressure 129/75, pulse 74, temperature 98.3 F (36.8 C), temperature source Oral, resp. rate 18, height 5\' 5"  (1.651 m), weight 164 lb 12.8 oz (74.753 kg), last menstrual period 03/15/2014, not currently breastfeeding.  Physical Exam  Constitutional: She is oriented to person, place, and time. She appears well-developed and well-nourished.  HENT:  Head: Normocephalic.  Eyes: Pupils are equal, round, and reactive to light.  Neck: Normal range of motion. Neck supple.  Cardiovascular: Normal rate, regular rhythm, normal heart sounds and intact distal pulses.   Respiratory: Effort normal and breath sounds normal.  GI: Soft. Bowel sounds are normal.  Genitourinary: Vagina normal and uterus normal.  Musculoskeletal: Normal range of motion.  Neurological: She is alert and oriented to person, place, and time. She has normal reflexes.  Skin: Skin is warm and dry.  Psychiatric:  She has a normal mood and affect. Her behavior is normal. Judgment and thought content normal.    MAU Course  Procedures  MDM Nauseas and vomiting of pregnancy  Assessment and Plan  Will assess urine for hydration, give rx for antiemetic and d/c home  Josey Dettmann DARLENE 05/30/2014, 8:07 PM

## 2014-05-30 NOTE — MAU Note (Signed)
Pt did not have a period last month or this month. Pt c/o n/v today. Pt had positive pregnancy test.

## 2014-05-30 NOTE — Discharge Instructions (Signed)
Hyperemesis Gravidarum °Hyperemesis gravidarum is a severe form of nausea and vomiting that happens during pregnancy. Hyperemesis is worse than morning sickness. It may cause you to have nausea or vomiting all day for many days. It may keep you from eating and drinking enough food and liquids. Hyperemesis usually occurs during the first half (the first 20 weeks) of pregnancy. It often goes away once a woman is in her second half of pregnancy. However, sometimes hyperemesis continues through an entire pregnancy.  °CAUSES  °The cause of this condition is not completely known but is thought to be related to changes in the body's hormones when pregnant. It could be from the high level of the pregnancy hormone or an increase in estrogen in the body.  °SIGNS AND SYMPTOMS  °· Severe nausea and vomiting. °· Nausea that does not go away. °· Vomiting that does not allow you to keep any food down. °· Weight loss and body fluid loss (dehydration). °· Having no desire to eat or not liking food you have previously enjoyed. °DIAGNOSIS  °Your health care provider will do a physical exam and ask you about your symptoms. He or she may also order blood tests and urine tests to make sure something else is not causing the problem.  °TREATMENT  °You may only need medicine to control the problem. If medicines do not control the nausea and vomiting, you will be treated in the hospital to prevent dehydration, increased acid in the blood (acidosis), weight loss, and changes in the electrolytes in your body that may harm the unborn baby (fetus). You may need IV fluids.  °HOME CARE INSTRUCTIONS  °· Only take over-the-counter or prescription medicines as directed by your health care provider. °· Try eating a couple of dry crackers or toast in the morning before getting out of bed. °· Avoid foods and smells that upset your stomach. °· Avoid fatty and spicy foods. °· Eat 5-6 small meals a day. °· Do not drink when eating meals. Drink between  meals. °· For snacks, eat high-protein foods, such as cheese. °· Eat or suck on things that have ginger in them. Ginger helps nausea. °· Avoid food preparation. The smell of food can spoil your appetite. °· Avoid iron pills and iron in your multivitamins until after 3-4 months of being pregnant. However, consult with your health care provider before stopping any prescribed iron pills. °SEEK MEDICAL CARE IF:  °· Your abdominal pain increases. °· You have a severe headache. °· You have vision problems. °· You are losing weight. °SEEK IMMEDIATE MEDICAL CARE IF:  °· You are unable to keep fluids down. °· You vomit blood. °· You have constant nausea and vomiting. °· You have excessive weakness. °· You have extreme thirst. °· You have dizziness or fainting. °· You have a fever or persistent symptoms for more than 2-3 days. °· You have a fever and your symptoms suddenly get worse. °MAKE SURE YOU:  °· Understand these instructions. °· Will watch your condition. °· Will get help right away if you are not doing well or get worse. °Document Released: 12/31/2004 Document Revised: 10/21/2012 Document Reviewed: 08/12/2012 °ExitCare® Patient Information ©2015 [employer]. This information is not intended to replace advice given to you by your health care provider. Make sure you discuss any questions you have with your health care provider. ° °

## 2014-06-08 ENCOUNTER — Inpatient Hospital Stay (HOSPITAL_COMMUNITY)
Admission: AD | Admit: 2014-06-08 | Discharge: 2014-06-08 | Disposition: A | Discharge status: Home / Self Care | Payer: Medicaid Other | Source: Ambulatory Visit | Attending: Obstetrics | Admitting: Obstetrics

## 2014-06-08 ENCOUNTER — Encounter (HOSPITAL_COMMUNITY): Payer: Self-pay | Admitting: *Deleted

## 2014-06-08 ENCOUNTER — Inpatient Hospital Stay (HOSPITAL_COMMUNITY): Payer: Medicaid Other

## 2014-06-08 DIAGNOSIS — Z833 Family history of diabetes mellitus: Secondary | ICD-10-CM | POA: Diagnosis not present

## 2014-06-08 DIAGNOSIS — O4691 Antepartum hemorrhage, unspecified, first trimester: Secondary | ICD-10-CM | POA: Diagnosis not present

## 2014-06-08 DIAGNOSIS — O209 Hemorrhage in early pregnancy, unspecified: Secondary | ICD-10-CM

## 2014-06-08 DIAGNOSIS — Z3A01 Less than 8 weeks gestation of pregnancy: Secondary | ICD-10-CM | POA: Insufficient documentation

## 2014-06-08 LAB — URINALYSIS, ROUTINE W REFLEX MICROSCOPIC
BILIRUBIN URINE: NEGATIVE
GLUCOSE, UA: NEGATIVE mg/dL
KETONES UR: NEGATIVE mg/dL
LEUKOCYTES UA: NEGATIVE
Nitrite: NEGATIVE
Protein, ur: NEGATIVE mg/dL
Specific Gravity, Urine: >1.03 — ABNORMAL HIGH (ref 1.005–1.030)
UROBILINOGEN UA: 0.2 mg/dL (ref 0.0–1.0)
pH: 6 (ref 5.0–8.0)

## 2014-06-08 LAB — URINE MICROSCOPIC-ADD ON

## 2014-06-08 LAB — CBC
HCT: 34.8 % — ABNORMAL LOW (ref 36.0–46.0)
HEMOGLOBIN: 12 g/dL (ref 12.0–15.0)
MCH: 30.6 pg (ref 26.0–34.0)
MCHC: 34.5 g/dL (ref 30.0–36.0)
MCV: 88.8 fL (ref 78.0–100.0)
Platelets: 171 10*3/uL (ref 150–400)
RBC: 3.92 MIL/uL (ref 3.87–5.11)
RDW: 13.1 % (ref 11.5–15.5)
WBC: 6.4 10*3/uL (ref 4.0–10.5)

## 2014-06-08 LAB — ABO/RH: ABO/RH(D): O POS

## 2014-06-08 LAB — HCG, QUANTITATIVE, PREGNANCY: HCG, BETA CHAIN, QUANT, S: 21332 m[IU]/mL — AB (ref <5)

## 2014-06-08 NOTE — Discharge Instructions (Signed)
First Trimester of Pregnancy The first trimester of pregnancy is from week 1 until the end of week 12 (months 1 through 3). A week after a sperm fertilizes an egg, the egg will implant on the wall of the uterus. This embryo will begin to develop into a baby. Genes from you and your partner are forming the baby. The female genes determine whether the baby is a boy or a girl. At 6-8 weeks, the eyes and face are formed, and the heartbeat can be seen on ultrasound. At the end of 12 weeks, all the baby's organs are formed.  Now that you are pregnant, you will want to do everything you can to have a healthy baby. Two of the most important things are to get good prenatal care and to follow your health care provider's instructions. Prenatal care is all the medical care you receive before the baby's birth. This care will help prevent, find, and treat any problems during the pregnancy and childbirth. BODY CHANGES Your body goes through many changes during pregnancy. The changes vary from woman to woman.   You may gain or lose a couple of pounds at first.  You may feel sick to your stomach (nauseous) and throw up (vomit). If the vomiting is uncontrollable, call your health care provider.  You may tire easily.  You may develop headaches that can be relieved by medicines approved by your health care provider.  You may urinate more often. Painful urination may mean you have a bladder infection.  You may develop heartburn as a result of your pregnancy.  You may develop constipation because certain hormones are causing the muscles that push waste through your intestines to slow down.  You may develop hemorrhoids or swollen, bulging veins (varicose veins).  Your breasts may begin to grow larger and become tender. Your nipples may stick out more, and the tissue that surrounds them (areola) may become darker.  Your gums may bleed and may be sensitive to brushing and flossing.  Dark spots or blotches (chloasma,  mask of pregnancy) may develop on your face. This will likely fade after the baby is born.  Your menstrual periods will stop.  You may have a loss of appetite.  You may develop cravings for certain kinds of food.  You may have changes in your emotions from day to day, such as being excited to be pregnant or being concerned that something may go wrong with the pregnancy and baby.  You may have more vivid and strange dreams.  You may have changes in your hair. These can include thickening of your hair, rapid growth, and changes in texture. Some women also have hair loss during or after pregnancy, or hair that feels dry or thin. Your hair will most likely return to normal after your baby is born. WHAT TO EXPECT AT YOUR PRENATAL VISITS During a routine prenatal visit:  You will be weighed to make sure you and the baby are growing normally.  Your blood pressure will be taken.  Your abdomen will be measured to track your baby's growth.  The fetal heartbeat will be listened to starting around week 10 or 12 of your pregnancy.  Test results from any previous visits will be discussed. Your health care provider may ask you:  How you are feeling.  If you are feeling the baby move.  If you have had any abnormal symptoms, such as leaking fluid, bleeding, severe headaches, or abdominal cramping.  If you have any questions. Other tests   that may be performed during your first trimester include:  Blood tests to find your blood type and to check for the presence of any previous infections. They will also be used to check for low iron levels (anemia) and Rh antibodies. Later in the pregnancy, blood tests for diabetes will be done along with other tests if problems develop.  Urine tests to check for infections, diabetes, or protein in the urine.  An ultrasound to confirm the proper growth and development of the baby.  An amniocentesis to check for possible genetic problems.  Fetal screens for  spina bifida and Down syndrome.  You may need other tests to make sure you and the baby are doing well. HOME CARE INSTRUCTIONS  Medicines  Follow your health care provider's instructions regarding medicine use. Specific medicines may be either safe or unsafe to take during pregnancy.  Take your prenatal vitamins as directed.  If you develop constipation, try taking a stool softener if your health care provider approves. Diet  Eat regular, well-balanced meals. Choose a variety of foods, such as meat or vegetable-based protein, fish, milk and low-fat dairy products, vegetables, fruits, and whole grain breads and cereals. Your health care provider will help you determine the amount of weight gain that is right for you.  Avoid raw meat and uncooked cheese. These carry germs that can cause birth defects in the baby.  Eating four or five small meals rather than three large meals a day may help relieve nausea and vomiting. If you start to feel nauseous, eating a few soda crackers can be helpful. Drinking liquids between meals instead of during meals also seems to help nausea and vomiting.  If you develop constipation, eat more high-fiber foods, such as fresh vegetables or fruit and whole grains. Drink enough fluids to keep your urine clear or pale yellow. Activity and Exercise  Exercise only as directed by your health care provider. Exercising will help you:  Control your weight.  Stay in shape.  Be prepared for labor and delivery.  Experiencing pain or cramping in the lower abdomen or low back is a good sign that you should stop exercising. Check with your health care provider before continuing normal exercises.  Try to avoid standing for long periods of time. Move your legs often if you must stand in one place for a long time.  Avoid heavy lifting.  Wear low-heeled shoes, and practice good posture.  You may continue to have sex unless your health care provider directs you  otherwise. Relief of Pain or Discomfort  Wear a good support bra for breast tenderness.   Take warm sitz baths to soothe any pain or discomfort caused by hemorrhoids. Use hemorrhoid cream if your health care provider approves.   Rest with your legs elevated if you have leg cramps or low back pain.  If you develop varicose veins in your legs, wear support hose. Elevate your feet for 15 minutes, 3-4 times a day. Limit salt in your diet. Prenatal Care  Schedule your prenatal visits by the twelfth week of pregnancy. They are usually scheduled monthly at first, then more often in the last 2 months before delivery.  Write down your questions. Take them to your prenatal visits.  Keep all your prenatal visits as directed by your health care provider. Safety  Wear your seat belt at all times when driving.  Make a list of emergency phone numbers, including numbers for family, friends, the hospital, and police and fire departments. General Tips    Ask your health care provider for a referral to a local prenatal education class. Begin classes no later than at the beginning of month 6 of your pregnancy.  Ask for help if you have counseling or nutritional needs during pregnancy. Your health care provider can offer advice or refer you to specialists for help with various needs.  Do not use hot tubs, steam rooms, or saunas.  Do not douche or use tampons or scented sanitary pads.  Do not cross your legs for long periods of time.  Avoid cat litter boxes and soil used by cats. These carry germs that can cause birth defects in the baby and possibly loss of the fetus by miscarriage or stillbirth.  Avoid all smoking, herbs, alcohol, and medicines not prescribed by your health care provider. Chemicals in these affect the formation and growth of the baby.  Schedule a dentist appointment. At home, brush your teeth with a soft toothbrush and be gentle when you floss. SEEK MEDICAL CARE IF:   You have  dizziness.  You have mild pelvic cramps, pelvic pressure, or nagging pain in the abdominal area.  You have persistent nausea, vomiting, or diarrhea.  You have a bad smelling vaginal discharge.  You have pain with urination.  You notice increased swelling in your face, hands, legs, or ankles. SEEK IMMEDIATE MEDICAL CARE IF:   You have a fever.  You are leaking fluid from your vagina.  You have spotting or bleeding from your vagina.  You have severe abdominal cramping or pain.  You have rapid weight gain or loss.  You vomit blood or material that looks like coffee grounds.  You are exposed to German measles and have never had them.  You are exposed to fifth disease or chickenpox.  You develop a severe headache.  You have shortness of breath.  You have any kind of trauma, such as from a fall or a car accident. Document Released: 12/25/2000 Document Revised: 05/17/2013 Document Reviewed: 11/10/2012 ExitCare Patient Information 2015 [employer]. This information is not intended to replace advice given to you by your health care provider. Make sure you discuss any questions you have with your health care provider.  

## 2014-06-08 NOTE — MAU Provider Note (Signed)
History     CSN: [id number]  Arrival date and time: 06/08/14 2110   First Provider Initiated Contact with Patient 06/08/14 2221      No chief complaint on file.  Vaginal Bleeding The patient's primary symptoms include vaginal bleeding. This is a new problem. The current episode started today (at 2000). The problem occurs intermittently. The problem has been gradually improving. The patient is experiencing no pain. She is pregnant. Pertinent negatives include no abdominal pain, constipation, diarrhea, dysuria, fever, frequency, nausea, urgency or vomiting. The vaginal discharge was bloody. The vaginal bleeding is spotting. She has not been passing clots. She has not been passing tissue. Nothing aggravates the symptoms. She has tried nothing for the symptoms.    Past Medical History  Diagnosis Date  . GERD (gastroesophageal reflux disease)     Past Surgical History  Procedure Laterality Date  . Mandible reconstruction      lower jaw surgery 2011    Family History  Problem Relation Age of Onset  . Diabetes Mother   . Diabetes Maternal Grandfather   . Hypertension Paternal Grandmother   . Hypertension Paternal Grandfather     History  Substance Use Topics  . Smoking status: Never Smoker   . Smokeless tobacco: Never Used  . Alcohol Use: No    Allergies: No Known Allergies  Prescriptions prior to admission  Medication Sig Dispense Refill Last Dose  . hydrOXYzine (ATARAX/VISTARIL) 25 MG tablet Take 1-2 tablets (25-50 mg total) by mouth 3 (three) times daily as needed. (Patient not taking: Reported on 02/21/2014) 30 tablet 0 Not Taking at Unknown time  . promethazine (PHENERGAN) 12.5 MG tablet Take 1 tablet (12.5 mg total) by mouth every 4 (four) hours as needed for nausea or vomiting. (Patient not taking: Reported on 06/08/2014) 30 tablet 4 Not Taking at Unknown time  . traZODone (DESYREL) 50 MG tablet Take 1 tablet (50 mg total) by mouth at bedtime. (Patient not taking:  Reported on 02/21/2014) 30 tablet 0 Not Taking at Unknown time    Review of Systems  Constitutional: Negative for fever.  Gastrointestinal: Negative for nausea, vomiting, abdominal pain, diarrhea and constipation.  Genitourinary: Positive for vaginal bleeding. Negative for dysuria, urgency and frequency.   Physical Exam   Blood pressure 116/66, pulse 85, temperature 98.8 F (37.1 C), temperature source Oral, resp. rate 16, height  (1.651 m), weight 74.844 kg (165 lb), last menstrual period 03/15/2014, SpO2 100 %, not currently breastfeeding.  Physical Exam  Nursing note and vitals reviewed. Constitutional: She is oriented to person, place, and time. She appears well-developed and well-nourished. No distress.  Cardiovascular: Normal rate.   Respiratory: Effort normal.  GI: Soft. There is no tenderness. There is no rebound.  Neurological: She is alert and oriented to person, place, and time.  Skin: Skin is warm and dry.  Psychiatric: She has a normal mood and affect.   US Ob Comp Less 14 Wks  06/08/2014   CLINICAL DATA:  Heavy bleeding since 8 p.m.  EXAM: OBSTETRIC <14 WK [place] AND TRANSVAGINAL OB US  TECHNIQUE: Both transabdominal and transvaginal ultrasound examinations were performed for complete evaluation of the gestation as well as the maternal uterus, adnexal regions, and pelvic cul-de-sac. Transvaginal technique was performed to assess early pregnancy.  COMPARISON:  None.  FINDINGS: Intrauterine gestational sac: Visualized/normal in shape. Note is made of a small subchorionic hemorrhage.  Yolk sac:  Visualized  Embryo:  Visualized  Cardiac Activity: Visualized  Heart Rate: 107 bpm  CRL:  44  mm   6 w   1 d                  US EDC: 01/31/2015  Maternal uterus/adnexae:  Normal appearance of the retroverted uterus. No discrete uterine mass.  Normal size and appearance of the left ovary measuring approximately 2.4 x 1.6 x 1.9 cm. Several tiny follicles are noted within the peripheral aspect  of the left ovary.  Normal size and appearance of the right ovary measuring approximately 4.8 x 2.5 x 4.0 cm. Note is made of a approximately 2.9 cm right-sided corpus luteal cyst.  IMPRESSION: 1. Single viable intrauterine gestation with crown rump length compatible with 6 weeks, 1 day gestation and estimated delivery date of 01/31/2015. 2. Small subchorionic hemorrhage.   Electronically Signed   By: Simonne ComeJohn  Watts M.D.   On: 06/08/2014 23:27   [place] Ob Transvaginal  06/08/2014   CLINICAL DATA:  Heavy bleeding since 8 p.m.  EXAM: OBSTETRIC <14 WK US AND TRANSVAGINAL OB US  TECHNIQUE: Both transabdominal and transvaginal ultrasound examinations were performed for complete evaluation of the gestation as well as the maternal uterus, adnexal regions, and pelvic cul-de-sac. Transvaginal technique was performed to assess early pregnancy.  COMPARISON:  None.  FINDINGS: Intrauterine gestational sac: Visualized/normal in shape. Note is made of a small subchorionic hemorrhage.  Yolk sac:  Visualized  Embryo:  Visualized  Cardiac Activity: Visualized  Heart Rate: 107 bpm  CRL:  44  mm   6 w   1 d                  US EDC: 01/31/2015  Maternal uterus/adnexae:  Normal appearance of the retroverted uterus. No discrete uterine mass.  Normal size and appearance of the left ovary measuring approximately 2.4 x 1.6 x 1.9 cm. Several tiny follicles are noted within the peripheral aspect of the left ovary.  Normal size and appearance of the right ovary measuring approximately 4.8 x 2.5 x 4.0 cm. Note is made of a approximately 2.9 cm right-sided corpus luteal cyst.  IMPRESSION: 1. Single viable intrauterine gestation with crown rump length compatible with 6 weeks, 1 day gestation and estimated delivery date of 01/31/2015. 2. Small subchorionic hemorrhage.   Electronically Signed   By: Simonne ComeJohn  Watts M.D.   On: 06/08/2014 23:27   Results for orders placed or performed during the hospital encounter of 06/08/14 (from the past 24 hour(s))   Urinalysis, Routine w reflex microscopic     Status: Abnormal   Collection Time: 06/08/14  9:20 PM  Result Value Ref Range   Color, Urine YELLOW YELLOW   APPearance CLEAR CLEAR   Specific Gravity, Urine >1.030 (H) 1.005 - 1.030   pH 6.0 5.0 - 8.0   Glucose, UA NEGATIVE NEGATIVE mg/dL   Hgb urine dipstick SMALL (A) NEGATIVE   Bilirubin Urine NEGATIVE NEGATIVE   Ketones, ur NEGATIVE NEGATIVE mg/dL   Protein, ur NEGATIVE NEGATIVE mg/dL   Urobilinogen, UA 0.2 0.0 - 1.0 mg/dL   Nitrite NEGATIVE NEGATIVE   Leukocytes, UA NEGATIVE NEGATIVE  Urine microscopic-add on     Status: Abnormal   Collection Time: 06/08/14  9:20 PM  Result Value Ref Range   Squamous Epithelial / LPF RARE RARE   WBC, UA 0-2 <3 WBC/hpf   RBC / HPF 3-6 <3 RBC/hpf   Bacteria, UA FEW (A) RARE  CBC     Status: Abnormal   Collection Time: 06/08/14 10:32 PM  Result  Value Ref Range   WBC 6.4 4.0 - 10.5 K/uL   RBC 3.92 3.87 - 5.11 MIL/uL   Hemoglobin 12.0 12.0 - 15.0 g/dL   HCT 16.1 (L) 09.6 - 04.5 %   MCV 88.8 78.0 - 100.0 fL   MCH 30.6 26.0 - 34.0 pg   MCHC 34.5 30.0 - 36.0 g/dL   RDW 40.9 81.1 - 91.4 %   Platelets 171 150 - 400 K/uL  ABO/Rh     Status: None   Collection Time: 06/08/14 10:32 PM  Result Value Ref Range   ABO/RH(D) O POS   hCG, quantitative, pregnancy     Status: Abnormal   Collection Time: 06/08/14 10:32 PM  Result Value Ref Range   hCG, Beta Chain, Quant, S 21332 (H) <5 mIU/mL   MAU Course  Procedures  MDM   Assessment and Plan   1. Vaginal bleeding in pregnancy, first trimester    DC home First trimester precautions  Return to MAU as needed [facility] as planned  Follow-up Information    Follow up with Brock Bad, MD.   Specialty:  Obstetrics and Gynecology   Why:  As scheduled   Contact information:   [address] [place] [address] [phone]       Tawnya Crook 06/08/2014, 10:22 PM

## 2014-06-08 NOTE — MAU Note (Signed)
Pt states having vaginal bleeding at 2020, states it was on the tissue when she wiped x 2, states her lower abd has been feeling tight off/on all day.

## 2014-06-14 ENCOUNTER — Ambulatory Visit (INDEPENDENT_AMBULATORY_CARE_PROVIDER_SITE_OTHER): Payer: Medicaid Other | Admitting: Certified Nurse Midwife

## 2014-06-14 ENCOUNTER — Encounter: Payer: Self-pay | Admitting: Certified Nurse Midwife

## 2014-06-14 ENCOUNTER — Encounter: Payer: Self-pay | Admitting: Obstetrics

## 2014-06-14 VITALS — BP 116/73 | HR 98 | Temp 98.8°F | Wt 166.0 lb

## 2014-06-14 DIAGNOSIS — Z3481 Encounter for supervision of other normal pregnancy, first trimester: Secondary | ICD-10-CM

## 2014-06-14 LAB — POCT URINALYSIS DIPSTICK
Bilirubin, UA: NEGATIVE
Blood, UA: NEGATIVE
Glucose, UA: NEGATIVE
Ketones, UA: NEGATIVE
NITRITE UA: NEGATIVE
SPEC GRAV UA: 1.025
UROBILINOGEN UA: NEGATIVE
pH, UA: 5

## 2014-06-14 MED ORDER — CITRANATAL ASSURE 35-1 & 300 MG PO MISC: 1.0000 | Freq: Every day | ORAL | Status: DC

## 2014-06-14 NOTE — Progress Notes (Signed)
Subjective:    Chelsea Wallace is being seen today for her first obstetrical visit.  This is not a planned pregnancy. She is at 2035w0d gestation. Her obstetrical history is significant for none. Relationship with FOB: significant other, living together. Patient does intend to breast feed, breast fed last child about 4-5 months. Pregnancy history fully reviewed.  Employed full time, [employer] call center.    The information documented in the HPI was reviewed and verified.  Menstrual History: OB History    Gravida Para Term Preterm AB TAB SAB Ectopic Multiple Living   2 1 1       1       Menarche age: [age]  Patient's last menstrual period was 03/15/2014.    Past Medical History  Diagnosis Date  . GERD (gastroesophageal reflux disease)     Past Surgical History  Procedure Laterality Date  . Mandible reconstruction      lower jaw surgery 2011     (Not in a hospital admission) No Known Allergies  History  Substance Use Topics  . Smoking status: Never Smoker   . Smokeless tobacco: Never Used  . Alcohol Use: No    Family History  Problem Relation Age of Onset  . Diabetes Mother   . Diabetes Maternal Grandfather   . Hypertension Paternal Grandmother   . Hypertension Paternal Grandfather      Review of Systems Constitutional: negative for weight loss Gastrointestinal: negative for vomiting, occasional nausea Genitourinary:negative for genital lesions and vaginal discharge and dysuria.  Had vaginal bleeding around 6 weeks of pregnancy.   Musculoskeletal:negative for back pain Behavioral/Psych: negative for abusive relationship, depression, illegal drug usage and tobacco use    Objective:    BP 116/73 mmHg  Pulse 98  Temp(Src) 98.8 F (37.1 C)  Wt 166 lb (75.297 kg)  LMP 03/15/2014 General Appearance:    Alert, cooperative, no distress, appears stated age  Head:    Normocephalic, without obvious abnormality, atraumatic  Eyes:    PERRL, conjunctiva/corneas clear, EOM's  intact, fundi    benign, both eyes  Ears:    Normal TM's and external ear canals, both ears  Nose:   Nares normal, septum midline, mucosa normal, no drainage    or sinus tenderness  Throat:   Lips, mucosa, and tongue normal; teeth and gums normal  Neck:   Supple, symmetrical, trachea midline, no adenopathy;    thyroid:  no enlargement/tenderness/nodules; no carotid   bruit or JVD  Back:     Symmetric, no curvature, ROM normal, no CVA tenderness  Lungs:     Clear to auscultation bilaterally, respirations unlabored  Chest Wall:    No tenderness or deformity   Heart:    Regular rate and rhythm, S1 and S2 normal, no murmur, rub   or gallop  Breast Exam:    No tenderness, masses, or nipple abnormality  Abdomen:     Soft, non-tender, bowel sounds active all four quadrants,    no masses, no organomegaly  Genitalia:    Normal female without lesion, discharge or tenderness  Extremities:   Extremities normal, atraumatic, no cyanosis or edema  Pulses:   2+ and symmetric all extremities  Skin:   Skin color, texture, turgor normal, no rashes or lesions  Lymph nodes:   Cervical, supraclavicular, and axillary nodes normal  Neurologic:   CNII-XII intact, normal strength, sensation and reflexes    throughout    Cervix  Long, thick, closed.  + cervicitis.  Lab Review Urine pregnancy test Labs reviewed yes Radiologic studies reviewed yes Assessment:    Pregnancy at [redacted]w[redacted]d weeks    Plan:      Prenatal vitamins.  Counseling provided regarding continued use of seat belts, cessation of alcohol consumption, smoking or use of illicit drugs; infection precautions i.e., influenza/TDAP immunizations, toxoplasmosis,CMV, parvovirus, listeria and varicella; workplace safety, exercise during pregnancy; routine dental care, safe medications, sexual activity, hot tubs, saunas, pools, travel, caffeine use, fish and methlymercury, potential toxins, hair treatments, varicose veins Weight gain recommendations per  IOM guidelines reviewed: underweight/BMI< 18.5--> gain 28 - 40 lbs; normal weight/BMI 18.5 - 24.9--> gain 25 - 35 lbs; overweight/BMI 25 - 29.9--> gain 15 - 25 lbs; obese/BMI >30->gain  11 - 20 lbs Problem list reviewed and updated. FIRST/CF mutation testing/NIPT/QUAD SCREEN/fragile X/Ashkenazi Jewish population testing/Spinal muscular atrophy discussed: requested. Role of ultrasound in pregnancy discussed; fetal survey: requested. Amniocentesis discussed: not indicated.  No orders of the defined types were placed in this encounter.   Orders Placed This Encounter  Procedures  . Culture, OB Urine  . Obstetric panel  . HIV antibody  . Hemoglobinopathy evaluation  . Varicella zoster antibody, IgG  . Vit D  25 hydroxy (rtn osteoporosis monitoring)  . POCT urinalysis dipstick    Follow up in 4 weeks. 50% of 30 min visit spent on counseling and coordination of care.

## 2014-06-15 LAB — OBSTETRIC PANEL
Antibody Screen: NEGATIVE
Basophils Absolute: 0 10*3/uL (ref 0.0–0.1)
Basophils Relative: 0 % (ref 0–1)
EOS PCT: 1 % (ref 0–5)
Eosinophils Absolute: 0.1 10*3/uL (ref 0.0–0.7)
HCT: 36.5 % (ref 36.0–46.0)
HEP B S AG: NEGATIVE
Hemoglobin: 12.2 g/dL (ref 12.0–15.0)
LYMPHS ABS: 1.9 10*3/uL (ref 0.7–4.0)
LYMPHS PCT: 35 % (ref 12–46)
MCH: 30.3 pg (ref 26.0–34.0)
MCHC: 33.4 g/dL (ref 30.0–36.0)
MCV: 90.6 fL (ref 78.0–100.0)
MPV: 12.8 fL — AB (ref 8.6–12.4)
Monocytes Absolute: 0.4 10*3/uL (ref 0.1–1.0)
Monocytes Relative: 7 % (ref 3–12)
NEUTROS PCT: 57 % (ref 43–77)
Neutro Abs: 3.1 10*3/uL (ref 1.7–7.7)
PLATELETS: 211 10*3/uL (ref 150–400)
RBC: 4.03 MIL/uL (ref 3.87–5.11)
RDW: 13.7 % (ref 11.5–15.5)
RH TYPE: POSITIVE
Rubella: 7.16 Index — ABNORMAL HIGH (ref <0.90)
WBC: 5.5 10*3/uL (ref 4.0–10.5)

## 2014-06-15 LAB — PAP IG W/ RFLX HPV ASCU

## 2014-06-15 LAB — VITAMIN D 25 HYDROXY (VIT D DEFICIENCY, FRACTURES): Vit D, 25-Hydroxy: 11 ng/mL — ABNORMAL LOW (ref 30–100)

## 2014-06-15 LAB — HIV ANTIBODY (ROUTINE TESTING W REFLEX): HIV: NONREACTIVE

## 2014-06-15 LAB — VARICELLA ZOSTER ANTIBODY, IGG: Varicella IgG: 3020 Index — ABNORMAL HIGH (ref <135.00)

## 2014-06-16 LAB — HEMOGLOBINOPATHY EVALUATION
HEMOGLOBIN OTHER: 0 %
HGB A2 QUANT: 2.4 % (ref 2.2–3.2)
HGB S QUANTITAION: 0 %
Hgb A: 97.6 % (ref 96.8–97.8)
Hgb F Quant: 0 % (ref 0.0–2.0)

## 2014-06-16 LAB — CULTURE, OB URINE

## 2014-06-16 LAB — SURESWAB, VAGINOSIS/VAGINITIS PLUS
ATOPOBIUM VAGINAE: 7 Log (cells/mL)
C. TRACHOMATIS RNA, TMA: DETECTED — AB
C. TROPICALIS, DNA: NOT DETECTED
C. albicans, DNA: NOT DETECTED
C. glabrata, DNA: NOT DETECTED
C. parapsilosis, DNA: NOT DETECTED
Gardnerella vaginalis: >8 Log (cells/mL)
LACTOBACILLUS SPECIES: NOT DETECTED Log (cells/mL)
MEGASPHAERA SPECIES: 7.6 Log (cells/mL)
N. gonorrhoeae RNA, TMA: NOT DETECTED
T. vaginalis RNA, QL TMA: NOT DETECTED

## 2014-06-17 ENCOUNTER — Other Ambulatory Visit: Payer: Self-pay | Admitting: Certified Nurse Midwife

## 2014-06-17 DIAGNOSIS — A749 Chlamydial infection, unspecified: Secondary | ICD-10-CM | POA: Insufficient documentation

## 2014-06-17 DIAGNOSIS — N760 Acute vaginitis: Secondary | ICD-10-CM

## 2014-06-17 DIAGNOSIS — O98819 Other maternal infectious and parasitic diseases complicating pregnancy, unspecified trimester: Principal | ICD-10-CM

## 2014-06-17 DIAGNOSIS — B9689 Other specified bacterial agents as the cause of diseases classified elsewhere: Secondary | ICD-10-CM

## 2014-06-17 DIAGNOSIS — O98812 Other maternal infectious and parasitic diseases complicating pregnancy, second trimester: Secondary | ICD-10-CM

## 2014-06-17 DIAGNOSIS — O98811 Other maternal infectious and parasitic diseases complicating pregnancy, first trimester: Secondary | ICD-10-CM

## 2014-06-17 MED ORDER — AZITHROMYCIN 500 MG PO TABS: ORAL_TABLET | ORAL | Status: DC

## 2014-06-17 MED ORDER — METRONIDAZOLE 0.75 % VA GEL: 1.0000 | Freq: Two times a day (BID) | VAGINAL | Status: DC

## 2014-06-27 ENCOUNTER — Inpatient Hospital Stay (HOSPITAL_COMMUNITY)
Admission: AD | Admit: 2014-06-27 | Discharge: 2014-06-27 | Disposition: A | Discharge status: Home / Self Care | Payer: Medicaid Other | Source: Ambulatory Visit | Attending: Obstetrics | Admitting: Obstetrics

## 2014-06-27 ENCOUNTER — Encounter (HOSPITAL_COMMUNITY): Payer: Self-pay | Admitting: *Deleted

## 2014-06-27 DIAGNOSIS — O9989 Other specified diseases and conditions complicating pregnancy, childbirth and the puerperium: Secondary | ICD-10-CM

## 2014-06-27 DIAGNOSIS — R0789 Other chest pain: Secondary | ICD-10-CM

## 2014-06-27 DIAGNOSIS — K219 Gastro-esophageal reflux disease without esophagitis: Secondary | ICD-10-CM | POA: Insufficient documentation

## 2014-06-27 DIAGNOSIS — O99611 Diseases of the digestive system complicating pregnancy, first trimester: Secondary | ICD-10-CM | POA: Insufficient documentation

## 2014-06-27 DIAGNOSIS — Z3A08 8 weeks gestation of pregnancy: Secondary | ICD-10-CM | POA: Insufficient documentation

## 2014-06-27 DIAGNOSIS — R079 Chest pain, unspecified: Secondary | ICD-10-CM | POA: Diagnosis present

## 2014-06-27 MED ORDER — GI COCKTAIL ~~LOC~~
30.0000 mL | Freq: Once | ORAL | Status: AC
  Administered 2014-06-27: 30 mL via ORAL
  Filled 2014-06-27: qty 30

## 2014-06-27 MED ORDER — RANITIDINE HCL 150 MG PO TABS: 150.0000 mg | ORAL_TABLET | Freq: Two times a day (BID) | ORAL | Status: DC

## 2014-06-27 NOTE — MAU Provider Note (Signed)
History     CSN: [id number]  Arrival date and time: 06/27/14 1020   First Provider Initiated Contact with Patient 06/27/14 1121      Chief Complaint  Patient presents with  . Shortness of Breath  . Chest Pain   HPI   Chelsea Wallace is a 24 y.o. female G2P1001 G2P1001 at [redacted]w[redacted]d presenting to MAU with chest pain; the chest pain is located in the middle of her chest; between her breasts. When she pushes on her chest it causes pain, and when she in [place] deeply it makes the pain worse.  She has not tried anything for the pain. She has a history of GERD which started in her last pregnancy; she took medication throughout her last pregnancy.   She denies history of cardiac problems.   OB History    Gravida Para Term Preterm AB TAB SAB Ectopic Multiple Living   2 1 1       1       Past Medical History  Diagnosis Date  . GERD (gastroesophageal reflux disease)     Past Surgical History  Procedure Laterality Date  . Mandible reconstruction      lower jaw surgery 2011    Family History  Problem Relation Age of Onset  . Diabetes Mother   . Diabetes Maternal Grandfather   . Hypertension Paternal Grandmother   . Hypertension Paternal Grandfather     History  Substance Use Topics  . Smoking status: Never Smoker   . Smokeless tobacco: Never Used  . Alcohol Use: No    Allergies: No Known Allergies  Prescriptions prior to admission  Medication Sig Dispense Refill Last Dose  . Prenat w/o A-FeCbGl-DSS-FA-DHA (CITRANATAL ASSURE) 35-1 & 300 MG tablet Take 1 tablet by mouth daily. 30 tablet 12 06/26/2014 at Unknown time  . azithromycin (ZITHROMAX) 500 MG tablet Take 2 pills together in one dose now. (Patient not taking: Reported on 06/27/2014) 2 tablet 0   . hydrOXYzine (ATARAX/VISTARIL) 25 MG tablet Take 1-2 tablets (25-50 mg total) by mouth 3 (three) times daily as needed. (Patient not taking: Reported on 02/21/2014) 30 tablet 0 Not Taking  . metroNIDAZOLE (METROGEL VAGINAL) 0.75  % vaginal gel Place 1 Applicatorful vaginally 2 (two) times daily. (Patient not taking: Reported on 06/27/2014) 70 g 0   . promethazine (PHENERGAN) 12.5 MG tablet Take 1 tablet (12.5 mg total) by mouth every 4 (four) hours as needed for nausea or vomiting. 30 tablet 4 06/25/2014   No results found for this or any previous visit (from the past 48 hour(s)).  Review of Systems  Constitutional: Negative for fever.  Eyes: Negative for blurred vision.  Respiratory: Negative for cough, shortness of breath (With walking and talking only ) and wheezing.   Cardiovascular: Positive for chest pain. Negative for leg swelling.   Physical Exam   Blood pressure 104/58, pulse 88, temperature 98.6 F (37 C), resp. rate 18, height 5\' 5"  (1.651 m), weight 76.261 kg (168 lb 2 oz), last menstrual period 03/15/2014, SpO2 100 %, not currently breastfeeding.  Physical Exam  Constitutional: She is oriented to person, place, and time. Vital signs are normal. She appears well-developed and well-nourished.  Non-toxic appearance. She does not have a sickly appearance. She does not appear ill. No distress.  HENT:  Head: Normocephalic.  Eyes: Pupils are equal, round, and reactive to light.  Neck: Neck supple.  Cardiovascular: Normal rate and normal heart sounds.   No murmur heard. Respiratory: Effort normal and  breath sounds normal. No respiratory distress. She has no wheezes. She has no rales. She exhibits tenderness.  Musculoskeletal: Normal range of motion.  Neurological: She is alert and oriented to person, place, and time.  Skin: Skin is warm. She is not diaphoretic.  Psychiatric: Her behavior is normal.    MAU Course  Procedures   None  MDM  Gi cocktail EKG  Consulted with Dr. Delton See with cardiology who reviewed the ECG.   GI cocktail brought patients pain level down to a 3/10 from 7/10.   Assessment and Plan   A:  1. Gastroesophageal reflux disease, esophagitis presence not specified   2.  Other chest pain     P:  Discharge home in stable condition RX: Zantac  Go to [facility] or [facility] ED if symptoms worsen Follow up with OB as scheduled GERD diet reviewed  Duane Lope, NP 06/27/2014 4:35 PM

## 2014-06-27 NOTE — MAU Note (Signed)
Urine in lab 

## 2014-06-27 NOTE — MAU Note (Signed)
Pt states here for shortness of breath and pressure in chest that began one week ago. Has had nausea, no vomiting. Denies issues with indigestion. Denies vaginal discharge/bleeding. Does have mild low back pain.

## 2014-06-27 NOTE — MAU Note (Addendum)
Pt presents to MAU with complaints of chest pain and shortness of breath since last Thursday. Reports her chest hurts when she takes a deep breathe. Denies any vaginal bleeding or vaginal discharge

## 2014-06-28 ENCOUNTER — Telehealth: Payer: Self-pay | Admitting: *Deleted

## 2014-06-28 ENCOUNTER — Emergency Department (HOSPITAL_COMMUNITY): Payer: Medicaid Other

## 2014-06-28 ENCOUNTER — Encounter (HOSPITAL_COMMUNITY): Payer: Self-pay

## 2014-06-28 ENCOUNTER — Emergency Department (HOSPITAL_COMMUNITY)
Admission: EM | Admit: 2014-06-28 | Discharge: 2014-06-28 | Disposition: A | Discharge status: Home / Self Care | Payer: Medicaid Other | Attending: Emergency Medicine | Admitting: Emergency Medicine

## 2014-06-28 DIAGNOSIS — K219 Gastro-esophageal reflux disease without esophagitis: Secondary | ICD-10-CM

## 2014-06-28 DIAGNOSIS — Z79899 Other long term (current) drug therapy: Secondary | ICD-10-CM | POA: Insufficient documentation

## 2014-06-28 DIAGNOSIS — M940 Chondrocostal junction syndrome [Tietze]: Secondary | ICD-10-CM | POA: Diagnosis not present

## 2014-06-28 DIAGNOSIS — R079 Chest pain, unspecified: Secondary | ICD-10-CM | POA: Diagnosis present

## 2014-06-28 LAB — CBC
HEMATOCRIT: 35.8 % — AB (ref 36.0–46.0)
Hemoglobin: 12.2 g/dL (ref 12.0–15.0)
MCH: 30.3 pg (ref 26.0–34.0)
MCHC: 34.1 g/dL (ref 30.0–36.0)
MCV: 89.1 fL (ref 78.0–100.0)
Platelets: 175 10*3/uL (ref 150–400)
RBC: 4.02 MIL/uL (ref 3.87–5.11)
RDW: 13.1 % (ref 11.5–15.5)
WBC: 6.5 10*3/uL (ref 4.0–10.5)

## 2014-06-28 LAB — I-STAT TROPONIN, ED: Troponin i, poc: 0.01 ng/mL (ref 0.00–0.08)

## 2014-06-28 LAB — BASIC METABOLIC PANEL
Anion gap: 8 (ref 5–15)
BUN: 14 mg/dL (ref 6–20)
CALCIUM: 9.7 mg/dL (ref 8.9–10.3)
CO2: 24 mmol/L (ref 22–32)
CREATININE: 0.63 mg/dL (ref 0.44–1.00)
Chloride: 104 mmol/L (ref 101–111)
GFR calc Af Amer: >60 mL/min (ref >60)
GFR calc non Af Amer: >60 mL/min (ref >60)
GLUCOSE: 100 mg/dL — AB (ref 65–99)
Potassium: 4.1 mmol/L (ref 3.5–5.1)
Sodium: 136 mmol/L (ref 135–145)

## 2014-06-28 LAB — BRAIN NATRIURETIC PEPTIDE: B Natriuretic Peptide: 16.4 pg/mL (ref 0.0–100.0)

## 2014-06-28 MED ORDER — RANITIDINE HCL 150 MG PO TABS: 150.0000 mg | ORAL_TABLET | Freq: Two times a day (BID) | ORAL | Status: DC

## 2014-06-28 NOTE — Telephone Encounter (Signed)
Patient state she was seen at MAU yesterday for chest pain- and she states the medication that she was given is not helping. 5:00 Attempt to call patient- tell her to go to [facility]- per [place]- but mailbox is full.

## 2014-06-28 NOTE — ED Notes (Signed)
G2P1, [redacted] weeks pregnant, no complications with this pregnancy. No vaginal bleeding, abd pain, leaking fluid.  Last pregnancy, vag delivery, no complications, fullterm.  Onset 06-24-14 mid chest pain and shortness of breath.  Went to [facility], EKG done, was advised if still having pain/shortness of breath f/u office or [facility].  Pt talking in complete sentences.

## 2014-06-28 NOTE — Discharge Instructions (Signed)
Take Tylenol for pain  Costochondritis Costochondritis, sometimes called Tietze syndrome, is a swelling and irritation (inflammation) of the tissue (cartilage) that connects your ribs with your breastbone (sternum). It causes pain in the chest and rib area. Costochondritis usually goes away on its own over time. It can take up to 6 weeks or longer to get better, especially if you are unable to limit your activities. CAUSES  Some cases of costochondritis have no known cause. Possible causes include:  Injury (trauma).  Exercise or activity such as lifting.  Severe coughing. SIGNS AND SYMPTOMS  Pain and tenderness in the chest and rib area.  Pain that gets worse when coughing or taking deep breaths.  Pain that gets worse with specific movements. DIAGNOSIS  Your health care provider will do a physical exam and ask about your symptoms. Chest X-rays or other tests may be done to rule out other problems. TREATMENT  Costochondritis usually goes away on its own over time. Your health care provider may prescribe medicine to help relieve pain. HOME CARE INSTRUCTIONS   Avoid exhausting physical activity. Try not to strain your ribs during normal activity. This would include any activities using chest, abdominal, and side muscles, especially if heavy weights are used.  Apply ice to the affected area for the first 2 days after the pain begins.  Put ice in a plastic bag.  Place a towel between your skin and the bag.  Leave the ice on for 20 minutes, 2-3 times a day.  Only take over-the-counter or prescription medicines as directed by your health care provider. SEEK MEDICAL CARE IF:  You have redness or swelling at the rib joints. These are signs of infection.  Your pain does not go away despite rest or medicine. SEEK IMMEDIATE MEDICAL CARE IF:   Your pain increases or you are very uncomfortable.  You have shortness of breath or difficulty breathing.  You cough up blood.  You have  worse chest pains, sweating, or vomiting.  You have a fever or persistent symptoms for more than 2-3 days.  You have a fever and your symptoms suddenly get worse. MAKE SURE YOU:   Understand these instructions.  Will watch your condition.  Will get help right away if you are not doing well or get worse. Document Released: 10/10/2004 Document Revised: 10/21/2012 Document Reviewed: 08/04/2012 [facility] Patient Information 2015 [place], [place]. This information is not intended to replace advice given to you by your health care provider. Make sure you discuss any questions you have with your health care provider.

## 2014-06-28 NOTE — ED Provider Notes (Signed)
CSN: [id number]     Arrival date & time 06/28/14  1801 History   First MD Initiated Contact with Patient 06/28/14 2126     Chief Complaint  Patient presents with  . Chest Pain     HPI  Expand All Collapse All   G2P1, [redacted] weeks pregnant, no complications with this pregnancy. No vaginal bleeding, abd pain, leaking fluid. Last pregnancy, vag delivery, no complications, fullterm. Onset 06-24-14 mid chest pain and shortness of breath. Went to [facility], EKG done, was advised if still having pain/shortness of breath f/u office or [facility]. Pt talking in complete sentences.        Past Medical History  Diagnosis Date  . GERD (gastroesophageal reflux disease)    Past Surgical History  Procedure Laterality Date  . Mandible reconstruction      lower jaw surgery 2011   Family History  Problem Relation Age of Onset  . Diabetes Mother   . Diabetes Maternal Grandfather   . Hypertension Paternal Grandmother   . Hypertension Paternal Grandfather    History  Substance Use Topics  . Smoking status: Never Smoker   . Smokeless tobacco: Never Used  . Alcohol Use: No   OB History    Gravida Para Term Preterm AB TAB SAB Ectopic Multiple Living   2 1 1       1      Review of Systems  All other systems reviewed and are negative.     Allergies  Review of patient's allergies indicates no known allergies.  Home Medications   Prior to Admission medications   Medication Sig Start Date End Date Taking? Authorizing Provider  Prenat w/o A-FeCbGl-DSS-FA-DHA (CITRANATAL ASSURE) 35-1 & 300 MG tablet Take 1 tablet by mouth daily. 06/14/14  Yes Rachelle A Denney, CNM  promethazine (PHENERGAN) 12.5 MG tablet Take 1 tablet (12.5 mg total) by mouth every 4 (four) hours as needed for nausea or vomiting. Patient not taking: Reported on 06/28/2014 05/30/14   Montez Morita, CNM  ranitidine (ZANTAC) 150 MG tablet Take 1 tablet (150 mg total) by mouth 2 (two) times daily. 06/28/14   Nelva Nay, MD    BP 108/70 mmHg  Pulse 81  Temp(Src) 98.1 F (36.7 C) (Oral)  Resp 14  SpO2 99%  LMP 03/15/2014  Breastfeeding? No Physical Exam  Constitutional: She is oriented to person, place, and time. She appears well-developed and well-nourished. No distress.  HENT:  Head: Normocephalic and atraumatic.  Eyes: Pupils are equal, round, and reactive to light.  Neck: Normal range of motion.  Cardiovascular: Normal rate and intact distal pulses.   Pulmonary/Chest: No respiratory distress.    Abdominal: Normal appearance. She exhibits no distension.  Musculoskeletal: Normal range of motion.  Neurological: She is alert and oriented to person, place, and time. No cranial nerve deficit.  Skin: Skin is warm and dry. No rash noted.  Psychiatric: She has a normal mood and affect. Her behavior is normal.  Nursing note and vitals reviewed.   ED Course  Procedures (including critical care time) Labs Review Labs Reviewed  CBC - Abnormal; Notable for the following:    HCT 35.8 (*)    All other components within normal limits  BASIC METABOLIC PANEL - Abnormal; Notable for the following:    Glucose, Bld 100 (*)    All other components within normal limits  BRAIN NATRIURETIC PEPTIDE  I-STAT TROPOININ, ED    Imaging Review Dg Chest 1 View  06/28/2014   CLINICAL DATA:  BILATERAL upper chest pain off and on for 2 days, pregnant  EXAM: CHEST  1 VIEW  COMPARISON:  06/20/2007  FINDINGS: Normal heart size, mediastinal contours and pulmonary vascularity.  Lungs clear.  No pleural effusion or pneumothorax.  Dextro convex thoracic scoliosis.  IMPRESSION: No acute abnormalities.   Electronically Signed   By: Ulyses Southward M.D.   On: 06/28/2014 20:01     EKG Interpretation   Date/Time:  Tuesday June 28 2014 18:04:48 EDT Ventricular Rate:  92 PR Interval:  134 QRS Duration: 66 QT Interval:  338 QTC Calculation: 417 R Axis:   81 Text Interpretation:  Normal sinus rhythm Normal ECG Confirmed by Arthelia Callicott    MD, Kason Benak ([id number]) on 06/28/2014 9:29:51 PM      MDM   Final diagnoses:  GERD without esophagitis  Costochondritis        Nelva Nay, MD 06/28/14 2143

## 2014-07-04 ENCOUNTER — Ambulatory Visit (INDEPENDENT_AMBULATORY_CARE_PROVIDER_SITE_OTHER): Payer: Medicaid Other | Admitting: Obstetrics

## 2014-07-04 ENCOUNTER — Encounter: Payer: Self-pay | Admitting: Obstetrics

## 2014-07-04 ENCOUNTER — Telehealth: Payer: Self-pay | Admitting: *Deleted

## 2014-07-04 VITALS — BP 121/71 | HR 96 | Temp 98.5°F | Wt 166.0 lb

## 2014-07-04 DIAGNOSIS — K219 Gastro-esophageal reflux disease without esophagitis: Secondary | ICD-10-CM

## 2014-07-04 MED ORDER — OMEPRAZOLE 20 MG PO CPDR: 20.0000 mg | DELAYED_RELEASE_CAPSULE | Freq: Two times a day (BID) | ORAL | Status: DC

## 2014-07-04 NOTE — Telephone Encounter (Signed)
Pt placed call to office stating that she was advised to follow up in office after ED visit to [facility] for chest pain.   Return call to pt.  Pt states that she has been taking Zantac as prescribed but the pain returns once medication wears off.  Pt states that she is also needing a note to return to work is advised by physician.   Pt was made aware that her medication could possibly be changed for better results and ease symptoms.  Pt was given an appt today at 3:30.

## 2014-07-04 NOTE — Progress Notes (Signed)
[name], female   DOB: Jan 10, 1991, 24 y.o.   MRN: [id number]  Chief Complaint  Patient presents with  . Routine Prenatal Visit    HPI Chelsea Wallace is a 24 y.o. female.  Heartburn.  Taking Zantac but only lasts for a few hours.  HPI  Past Medical History  Diagnosis Date  . GERD (gastroesophageal reflux disease)     Past Surgical History  Procedure Laterality Date  . Mandible reconstruction      lower jaw surgery 2011    Family History  Problem Relation Age of Onset  . Diabetes Mother   . Diabetes Maternal Grandfather   . Hypertension Paternal Grandmother   . Hypertension Paternal Grandfather     Social History History  Substance Use Topics  . Smoking status: Never Smoker   . Smokeless tobacco: Never Used  . Alcohol Use: No    No Known Allergies  Current Outpatient Prescriptions  Medication Sig Dispense Refill  . Prenat w/o A-FeCbGl-DSS-FA-DHA (CITRANATAL ASSURE) 35-1 & 300 MG tablet Take 1 tablet by mouth daily. 30 tablet 12  . promethazine (PHENERGAN) 12.5 MG tablet Take 1 tablet (12.5 mg total) by mouth every 4 (four) hours as needed for nausea or vomiting. 30 tablet 4  . ranitidine (ZANTAC) 150 MG tablet Take 1 tablet (150 mg total) by mouth 2 (two) times daily. 30 tablet 0  . omeprazole (PRILOSEC) 20 MG capsule Take 1 capsule (20 mg total) by mouth 2 (two) times daily before a meal. 60 capsule 5   No current facility-administered medications for this visit.    Review of Systems Review of Systems Constitutional: negative for fatigue and weight loss Respiratory: negative for cough and wheezing Cardiovascular: negative for chest pain, fatigue and palpitations Gastrointestinal: positive for heartburn Genitourinary:negative Integument/breast: negative for nipple discharge Musculoskeletal:negative for myalgias Neurological: negative for gait problems and tremors Behavioral/Psych: negative for abusive relationship, depression Endocrine:  negative for temperature intolerance     Blood pressure 121/71, pulse 96, temperature 98.5 F (36.9 C), weight 166 lb (75.297 kg), last menstrual period 03/15/2014, not currently breastfeeding.  Physical Exam Physical Exam:  Deferred  100% of 10 minute visit spent on counseling and coordination of care.    Plan    Stop Zantac Omeprazole Rx F/U in 1 week  No orders of the defined types were placed in this encounter.   Meds ordered this encounter  Medications  . omeprazole (PRILOSEC) 20 MG capsule    Sig: Take 1 capsule (20 mg total) by mouth 2 (two) times daily before a meal.    Dispense:  60 capsule    Refill:  5

## 2014-07-04 NOTE — Progress Notes (Signed)
Patient has continued to have chest pain with no relief.

## 2014-07-12 ENCOUNTER — Ambulatory Visit (INDEPENDENT_AMBULATORY_CARE_PROVIDER_SITE_OTHER): Payer: Medicaid Other | Admitting: Certified Nurse Midwife

## 2014-07-12 VITALS — BP 122/67 | HR 87 | Temp 98.2°F | Wt 168.1 lb

## 2014-07-12 DIAGNOSIS — Z3491 Encounter for supervision of normal pregnancy, unspecified, first trimester: Secondary | ICD-10-CM | POA: Diagnosis not present

## 2014-07-12 DIAGNOSIS — O219 Vomiting of pregnancy, unspecified: Secondary | ICD-10-CM

## 2014-07-12 LAB — POCT URINALYSIS DIPSTICK
BILIRUBIN UA: NEGATIVE
Blood, UA: NEGATIVE
Glucose, UA: NORMAL
Ketones, UA: NEGATIVE
LEUKOCYTES UA: NEGATIVE
Nitrite, UA: NEGATIVE
Protein, UA: NEGATIVE
Spec Grav, UA: 1.025
Urobilinogen, UA: NEGATIVE
pH, UA: 5

## 2014-07-12 MED ORDER — DOXYLAMINE-PYRIDOXINE 10-10 MG PO TBEC: 2.0000 | DELAYED_RELEASE_TABLET | Freq: Every day | ORAL | Status: DC

## 2014-07-12 NOTE — Progress Notes (Signed)
  Subjective:    Chelsea Wallace is a 24 y.o. female being seen today for her obstetrical visit. She is at 419w0d gestation. Patient reports: heartburn, nausea, no bleeding, no cramping, no leaking and vomiting.  Problem List Items Addressed This Visit    None    Visit Diagnoses    Supervision of normal pregnancy, first trimester    -  Primary    Relevant Orders    POCT urinalysis dipstick (Completed)    Nausea and vomiting in pregnancy prior to [redacted] weeks gestation        Relevant Medications    Doxylamine-Pyridoxine (DICLEGIS) 10-10 MG TBEC      Patient Active Problem List   Diagnosis Date Noted  . Chlamydia infection during pregnancy, antepartum 06/17/2014  . GERD without esophagitis 02/04/2013  . Cervicitis and endocervicitis 10/06/2012    Objective:     BP 122/67 mmHg  Pulse 87  Temp(Src) 98.2 F (36.8 C)  Wt 168 lb 1.6 oz (76.25 kg)  LMP 03/15/2014 Uterine Size: Below umbilicus   FH activity present with ultrasound  Assessment:    Pregnancy @ 609w0d  weeks Doing well N&V in early pregnancy GERD    Plan:    Problem list reviewed and updated. Labs reviewed.  Follow up in 4 weeks. FIRST/CF mutation testing/NIPT/QUAD SCREEN/fragile X/Ashkenazi Jewish population testing/Spinal muscular atrophy discussed: requested. Role of ultrasound in pregnancy discussed; fetal survey: requested. Amniocentesis discussed: not indicated. 50% of 15 minute visit spent on counseling and coordination of care.

## 2014-08-09 ENCOUNTER — Ambulatory Visit (INDEPENDENT_AMBULATORY_CARE_PROVIDER_SITE_OTHER): Payer: Medicaid Other | Admitting: Certified Nurse Midwife

## 2014-08-09 VITALS — BP 115/70 | HR 87 | Temp 98.8°F | Wt 170.0 lb

## 2014-08-09 DIAGNOSIS — B9689 Other specified bacterial agents as the cause of diseases classified elsewhere: Secondary | ICD-10-CM

## 2014-08-09 DIAGNOSIS — B373 Candidiasis of vulva and vagina: Secondary | ICD-10-CM

## 2014-08-09 DIAGNOSIS — A499 Bacterial infection, unspecified: Secondary | ICD-10-CM

## 2014-08-09 DIAGNOSIS — N760 Acute vaginitis: Secondary | ICD-10-CM

## 2014-08-09 DIAGNOSIS — B3731 Acute candidiasis of vulva and vagina: Secondary | ICD-10-CM

## 2014-08-09 DIAGNOSIS — Z3482 Encounter for supervision of other normal pregnancy, second trimester: Secondary | ICD-10-CM

## 2014-08-09 LAB — POCT URINALYSIS DIPSTICK
Bilirubin, UA: NEGATIVE
Glucose, UA: NEGATIVE
Ketones, UA: NEGATIVE
Nitrite, UA: NEGATIVE
PROTEIN UA: NEGATIVE
RBC UA: NEGATIVE
SPEC GRAV UA: 1.015
Urobilinogen, UA: NEGATIVE
pH, UA: 7

## 2014-08-09 MED ORDER — TINIDAZOLE 500 MG PO TABS: 2.0000 g | ORAL_TABLET | Freq: Every day | ORAL | Status: AC

## 2014-08-09 MED ORDER — TERCONAZOLE 0.4 % VA CREA: 1.0000 | TOPICAL_CREAM | Freq: Every day | VAGINAL | Status: DC

## 2014-08-09 NOTE — Progress Notes (Signed)
  Subjective:    Chelsea Wallace is a 24 y.o. female being seen today for her obstetrical visit. She is at [redacted]w[redacted]d gestation. Patient reports: nausea, no bleeding, no contractions, no cramping, no leaking and Nausea is improved with the Diclegis.  She states that she was not sure if her partner was treated. TOC today.  .  Problem List Items Addressed This Visit    None    Visit Diagnoses    Encounter for supervision of other normal pregnancy in second trimester    -  Primary    Relevant Orders    POCT urinalysis dipstick    US OB Comp + 14 Wk    AFP, Quad Screen    BV (bacterial vaginosis)        Relevant Medications    tinidazole (TINDAMAX) 500 MG tablet    Vulvovaginal candidiasis        Relevant Medications    tinidazole (TINDAMAX) 500 MG tablet    terconazole (TERAZOL 7) 0.4 % vaginal cream      Patient Active Problem List   Diagnosis Date Noted  . Chlamydia infection during pregnancy, antepartum 06/17/2014  . GERD without esophagitis 02/04/2013  . Cervicitis and endocervicitis 10/06/2012    Objective:     BP 115/70 mmHg  Pulse 87  Temp(Src) 98.8 F (37.1 C)  Wt 170 lb (77.111 kg)  LMP 03/15/2014 Uterine Size: Below umbilicus   FHR: 155 by doppler.   Vaginal discharge present:  Yellow-clear with odor present on vulva.  Denies itching.    Assessment:    Pregnancy @ [redacted]w[redacted]d  weeks Doing well   BV Vulvovaginal Candidiasis  Plan:    Problem list reviewed and updated. Labs reviewed. TOC: sure swab in office today Follow up in 4 weeks. FIRST/CF mutation testing/NIPT/QUAD SCREEN/fragile X/Ashkenazi Jewish population testing/Spinal muscular atrophy discussed: ordered. Role of ultrasound in pregnancy discussed; fetal survey: ordered. Amniocentesis discussed: not indicated. 50% of 15 minute visit spent on counseling and coordination of care.

## 2014-08-09 NOTE — Addendum Note (Signed)
Addended by: Marya Landry D on: 08/09/2014 04:15 PM   Modules accepted: Orders

## 2014-08-11 LAB — AFP, QUAD SCREEN
AFP: 28.6 ng/mL
Age Alone: 1:1150 {titer}
Curr Gest Age: 14.6 wks.days
Down Syndrome Scr Risk Est: <1:38500 {titer}
HCG TOTAL: 25.55 [IU]/mL
INH: 117.7 pg/mL
Interpretation-AFP: NEGATIVE
MoM for AFP: 1.07
MoM for INH: 0.7
MoM for hCG: 0.52
Open Spina bifida: NEGATIVE
Osb Risk: 1:10600 {titer}
TRI 18 SCR RISK EST: NEGATIVE
Trisomy 18 (Edward) Syndrome Interp.: 1:10800 {titer}
uE3 Mom: 0.79
uE3 Value: 0.44 ng/mL

## 2014-08-11 LAB — SURESWAB, VAGINOSIS/VAGINITIS PLUS
ATOPOBIUM VAGINAE: 6.9 Log (cells/mL)
C. albicans, DNA: NOT DETECTED
C. glabrata, DNA: NOT DETECTED
C. parapsilosis, DNA: NOT DETECTED
C. trachomatis RNA, TMA: DETECTED — AB
C. tropicalis, DNA: NOT DETECTED
Gardnerella vaginalis: >8 Log (cells/mL)
LACTOBACILLUS SPECIES: NOT DETECTED Log (cells/mL)
MEGASPHAERA SPECIES: 7.6 Log (cells/mL)
N. GONORRHOEAE RNA, TMA: NOT DETECTED
T. VAGINALIS RNA, QL TMA: NOT DETECTED

## 2014-08-12 ENCOUNTER — Other Ambulatory Visit: Payer: Self-pay | Admitting: Certified Nurse Midwife

## 2014-08-12 DIAGNOSIS — A749 Chlamydial infection, unspecified: Secondary | ICD-10-CM

## 2014-08-12 DIAGNOSIS — N760 Acute vaginitis: Principal | ICD-10-CM

## 2014-08-12 DIAGNOSIS — B9689 Other specified bacterial agents as the cause of diseases classified elsewhere: Secondary | ICD-10-CM

## 2014-08-12 MED ORDER — TINIDAZOLE 500 MG PO TABS: 2.0000 g | ORAL_TABLET | Freq: Every day | ORAL | Status: AC

## 2014-08-12 MED ORDER — AZITHROMYCIN 250 MG PO TABS: ORAL_TABLET | ORAL | Status: DC

## 2014-08-12 MED ORDER — METRONIDAZOLE 0.75 % VA GEL: 1.0000 | Freq: Two times a day (BID) | VAGINAL | Status: DC

## 2014-08-23 ENCOUNTER — Telehealth: Payer: Self-pay | Admitting: *Deleted

## 2014-08-23 NOTE — Telephone Encounter (Signed)
Pt called to office inquiring about medication approval. Return call to pt making her aware that approval is still pending.  Pt made aware it may take 24-48 hours for approval.  Pt made aware that I will call her once I have approval.

## 2014-09-03 ENCOUNTER — Encounter (HOSPITAL_COMMUNITY): Payer: Self-pay

## 2014-09-03 ENCOUNTER — Inpatient Hospital Stay (HOSPITAL_COMMUNITY)
Admission: AD | Admit: 2014-09-03 | Discharge: 2014-09-03 | Disposition: A | Discharge status: Home / Self Care | Payer: Medicaid Other | Source: Ambulatory Visit | Attending: Obstetrics | Admitting: Obstetrics

## 2014-09-03 DIAGNOSIS — R109 Unspecified abdominal pain: Secondary | ICD-10-CM

## 2014-09-03 DIAGNOSIS — Z3A18 18 weeks gestation of pregnancy: Secondary | ICD-10-CM | POA: Insufficient documentation

## 2014-09-03 DIAGNOSIS — N949 Unspecified condition associated with female genital organs and menstrual cycle: Secondary | ICD-10-CM

## 2014-09-03 DIAGNOSIS — O9989 Other specified diseases and conditions complicating pregnancy, childbirth and the puerperium: Secondary | ICD-10-CM | POA: Diagnosis not present

## 2014-09-03 DIAGNOSIS — R102 Pelvic and perineal pain: Secondary | ICD-10-CM | POA: Diagnosis not present

## 2014-09-03 DIAGNOSIS — Z79899 Other long term (current) drug therapy: Secondary | ICD-10-CM | POA: Diagnosis not present

## 2014-09-03 DIAGNOSIS — O26899 Other specified pregnancy related conditions, unspecified trimester: Secondary | ICD-10-CM

## 2014-09-03 LAB — URINALYSIS, ROUTINE W REFLEX MICROSCOPIC
BILIRUBIN URINE: NEGATIVE
Glucose, UA: NEGATIVE mg/dL
Hgb urine dipstick: NEGATIVE
KETONES UR: 15 mg/dL — AB
NITRITE: NEGATIVE
PROTEIN: NEGATIVE mg/dL
Specific Gravity, Urine: 1.02 (ref 1.005–1.030)
pH: 6.5 (ref 5.0–8.0)

## 2014-09-03 LAB — URINE MICROSCOPIC-ADD ON

## 2014-09-03 NOTE — MAU Note (Signed)
My entire back and stomach hurting since woke up this am. Esp hurts lower abd when i walk. When i empty my bladder feels like  My stomach gets really tight. No bleeding or LOF

## 2014-09-03 NOTE — Discharge Instructions (Signed)
Round Ligament Pain During Pregnancy °Round ligament pain is a sharp pain or jabbing feeling often felt in the lower belly or groin area on one or both sides. It is one of the most common complaints during pregnancy and is considered a normal part of pregnancy. It is most often felt during the second trimester. ° °Here is what you need to know about round ligament pain, including some tips to help you feel better. ° °Causes of Round Ligament Pain ° °Several thick ligaments surround and support your womb (uterus) as it grows during pregnancy. One of them is called the round ligament. ° °The round ligament connects the front part of the womb to your groin, the area where your legs attach to your pelvis. The round ligament normally tightens and relaxes slowly. ° °As your baby and womb grow, the round ligament stretches. That makes it more likely to become strained. ° °Sudden movements can cause the ligament to tighten quickly, like a rubber band snapping. This causes a sudden and quick jabbing feeling. ° °Symptoms of Round Ligament Pain ° °Round ligament pain can be concerning and uncomfortable. But it is considered normal as your body changes during pregnancy. ° °The symptoms of round ligament pain include a sharp, sudden spasm in the belly. It usually affects the right side, but it may happen on both sides. The pain only lasts a few seconds. ° °Exercise may cause the pain, as will rapid movements such as: ° °sneezing °coughing °laughing °rolling over in bed °standing up too quickly ° °Treatment of Round Ligament Pain ° °Here are some tips that may help reduce your discomfort: ° °Pain relief. Take over-the-counter acetaminophen for pain, if necessary. Ask your doctor if this is OK. ° °Exercise. Get plenty of exercise to keep your stomach (core) muscles strong. Doing stretching exercises or prenatal yoga can be helpful. Ask your doctor which exercises are safe for you and your baby. ° °A helpful exercise involves  putting your hands and knees on the floor, lowering your head, and pushing your backside into the air. ° °Avoid sudden movements. Change positions slowly (such as standing up or sitting down) to avoid sudden movements that may cause stretching and pain. ° °Flex your hips. Bend and flex your hips before you cough, sneeze, or laugh to avoid pulling on the ligaments. ° °Apply warmth. A heating pad or warm bath may be helpful. Ask your doctor if this is OK. Extreme heat can be dangerous to the baby. ° °You should try to modify your daily activity level and avoid positions that may worsen the condition. ° °When to Call the Doctor/Midwife ° °Always tell your doctor or midwife about any type of pain you have during pregnancy. Round ligament pain is quick and doesn't last long. ° °Call your health care provider immediately if you have: ° °severe pain °fever °chills °pain on urination °difficulty walking ° °Belly pain during pregnancy can be due to many different causes. It is important for your doctor to rule out more serious conditions, including pregnancy complications such as placenta abruption or non-pregnancy illnesses such as: ° °inguinal hernia °appendicitis °stomach, liver, and kidney problems °Preterm labor pains may sometimes be mistaken for round ligament pain. ° ° °Abdominal Pain During Pregnancy °Abdominal pain is common in pregnancy. Most of the time, it does not cause harm. There are many causes of abdominal pain. Some causes are more serious than others. Some of the causes of abdominal pain in pregnancy are easily diagnosed. Occasionally,   the diagnosis takes time to understand. Other times, the cause is not determined. Abdominal pain can be a sign that something is very wrong with the pregnancy, or the pain may have nothing to do with the pregnancy at all. For this reason, always tell your health care provider if you have any abdominal discomfort. °HOME CARE INSTRUCTIONS  °Monitor your abdominal pain for any  changes. The following actions may help to alleviate any discomfort you are experiencing: °· Do not have sexual intercourse or put anything in your vagina until your symptoms go away completely. °· Get plenty of rest until your pain improves. °· Drink clear fluids if you feel nauseous. Avoid solid food as long as you are uncomfortable or nauseous. °· Only take over-the-counter or prescription medicine as directed by your health care provider. °· Keep all follow-up appointments with your health care provider. °SEEK IMMEDIATE MEDICAL CARE IF: °· You are bleeding, leaking fluid, or passing tissue from the vagina. °· You have increasing pain or cramping. °· You have persistent vomiting. °· You have painful or bloody urination. °· You have a fever. °· You notice a decrease in your baby's movements. °· You have extreme weakness or feel faint. °· You have shortness of breath, with or without abdominal pain. °· You develop a severe headache with abdominal pain. °· You have abnormal vaginal discharge with abdominal pain. °· You have persistent diarrhea. °· You have abdominal pain that continues even after rest, or gets worse. °MAKE SURE YOU:  °· Understand these instructions. °· Will watch your condition. °· Will get help right away if you are not doing well or get worse. °Document Released: 12/31/2004 Document Revised: 10/21/2012 Document Reviewed: 07/30/2012 °[employer]® Patient Information ©2015 [employer]. This information is not intended to replace advice given to you by your health care provider. Make sure you discuss any questions you have with your health care provider. ° °

## 2014-09-03 NOTE — MAU Provider Note (Signed)
Chief Complaint: Abdominal Pain and Back Pain   First Provider Initiated Contact with Patient 09/03/14 2251      SUBJECTIVE HPI: Chelsea Wallace is a 24 y.o. G2P1001 at [redacted]w[redacted]d by LMP who presents to maternity admissions reporting sharp abdominal pain in her lower stomach bilaterally, starting when walking and increasing with movement with onset earlier today.  She denies vaginal bleeding, vaginal itching/burning, urinary symptoms, h/a, dizziness, n/v, or fever/chills.     Abdominal Pain This is a new problem. The current episode started today. The onset quality is sudden. The problem occurs intermittently. The problem has been waxing and waning. The pain is located in the LLQ and RLQ. The pain is moderate. The quality of the pain is cramping and sharp. The abdominal pain radiates to the back. Pertinent negatives include no constipation, diarrhea, dysuria, fever, frequency, headaches, nausea or vomiting. The pain is aggravated by movement. The pain is relieved by being still. She has tried nothing for the symptoms.  Back Pain Associated symptoms include abdominal pain. Pertinent negatives include no chest pain, dysuria, fever, headaches, pelvic pain or weakness.    Past Medical History  Diagnosis Date  . GERD (gastroesophageal reflux disease)    Past Surgical History  Procedure Laterality Date  . Mandible reconstruction      lower jaw surgery 2011   Social History   Social History  . Marital Status: Single    Spouse Name: N/A  . Number of Children: N/A  . Years of Education: N/A   Occupational History  . Not on file.   Social History Main Topics  . Smoking status: Never Smoker   . Smokeless tobacco: Never Used  . Alcohol Use: No  . Drug Use: No  . Sexual Activity: Not Currently    Birth Control/ Protection: None   Other Topics Concern  . Not on file   Social History Narrative   No current facility-administered medications on file prior to encounter.   Current  Outpatient Prescriptions on File Prior to Encounter  Medication Sig Dispense Refill  . omeprazole (PRILOSEC) 20 MG capsule Take 1 capsule (20 mg total) by mouth 2 (two) times daily before a meal. 60 capsule 5  . Prenat w/o A-[id number]-[id number]-FA-DHA (CITRANATAL ASSURE) 35-1 & 300 MG tablet Take 1 tablet by mouth daily. 30 tablet 12  . ranitidine (ZANTAC) 150 MG tablet Take 1 tablet (150 mg total) by mouth 2 (two) times daily. 30 tablet 0  . azithromycin (ZITHROMAX) 250 MG tablet Take 4 tablets at one time with food. (Patient not taking: Reported on 09/03/2014) 4 tablet 0  . Doxylamine-Pyridoxine (DICLEGIS) 10-10 MG TBEC Take 2 tablets by mouth at bedtime. (Patient not taking: Reported on 08/09/2014) 100 tablet 4  . metroNIDAZOLE (METROGEL VAGINAL) 0.75 % vaginal gel Place 1 Applicatorful vaginally 2 (two) times daily. (Patient not taking: Reported on 09/03/2014) 70 g 0  . promethazine (PHENERGAN) 12.5 MG tablet Take 1 tablet (12.5 mg total) by mouth every 4 (four) hours as needed for nausea or vomiting. (Patient not taking: Reported on 07/12/2014) 30 tablet 4  . terconazole (TERAZOL 7) 0.4 % vaginal cream Place 1 applicator vaginally at bedtime. (Patient not taking: Reported on 09/03/2014) 45 g 0   No Known Allergies  ROS:  Review of Systems  Constitutional: Negative for fever, chills and fatigue.  HENT: Negative for sinus pressure.   Eyes: Negative for photophobia.  Respiratory: Negative for shortness of breath.   Cardiovascular: Negative for chest pain.  Gastrointestinal: Positive for  abdominal pain. Negative for nausea, vomiting, diarrhea and constipation.  Genitourinary: Negative for dysuria, frequency, flank pain, vaginal bleeding, vaginal discharge, difficulty urinating, vaginal pain and pelvic pain.  Musculoskeletal: Positive for back pain. Negative for neck pain.  Neurological: Negative for dizziness, weakness and headaches.  Psychiatric/Behavioral: Negative.      I have reviewed patient's  Past Medical Hx, Surgical Hx, Family Hx, Social Hx, medications and allergies.   Physical Exam  Patient Vitals for the past 24 hrs:  BP Temp Pulse Resp Height Weight  09/03/14 2300 120/82 mmHg - 106 - - -  09/03/14 2133 116/64 mmHg 98.2 F (36.8 C) 102 18 5\' 5"  (1.651 m) 77.837 kg (171 lb 9.6 oz)   Constitutional: Well-developed, well-nourished female in no acute distress.  Cardiovascular: normal rate Respiratory: normal effort GI: Abd soft, non-tender, gravid appropriate for gestational age. Pos BS x 4 MS: Extremities nontender, no edema, normal ROM Neurologic: Alert and oriented x 4.  GU: Neg CVAT.  Dilation: Closed Effacement (%): Thick Exam by:: L. Courtney Paris CNM  FHT 165 by doppler  LAB RESULTS Results for orders placed or performed during the hospital encounter of 09/03/14 (from the past 24 hour(s))  Urinalysis, Routine w reflex microscopic (not at [facility])     Status: Abnormal   Collection Time: 09/03/14  9:45 PM  Result Value Ref Range   Color, Urine YELLOW YELLOW   APPearance CLEAR CLEAR   Specific Gravity, Urine 1.020 1.005 - 1.030   pH 6.5 5.0 - 8.0   Glucose, UA NEGATIVE NEGATIVE mg/dL   Hgb urine dipstick NEGATIVE NEGATIVE   Bilirubin Urine NEGATIVE NEGATIVE   Ketones, ur 15 (A) NEGATIVE mg/dL   Protein, ur NEGATIVE NEGATIVE mg/dL   Urobilinogen, UA >1.6 (H) 0.0 - 1.0 mg/dL   Nitrite NEGATIVE NEGATIVE   Leukocytes, UA MODERATE (A) NEGATIVE  Urine microscopic-add on     Status: None   Collection Time: 09/03/14  9:45 PM  Result Value Ref Range   Squamous Epithelial / LPF RARE RARE   WBC, UA 3-6 <3 WBC/hpf   RBC / HPF 0-2 <3 RBC/hpf   Bacteria, UA RARE RARE   Urine-Other MUCOUS PRESENT     O/POS/-- (05/31 1629)  IMAGING No results found.  MAU Management/MDM: Ordered and reviewed labs.  FHT  Normal, cervical exam with no signs of preterm labor.  Pain palpable in bilateral inguinal areas.  Likely round ligament pain worsened with walking/movement  today.  Reassurance provided, teaching done and pt stable at time of discharge.  ASSESSMENT 1. Round ligament pain   2. Abdominal pain affecting pregnancy     PLAN Discharge home Rest/ice/heat/tylenol for pain Urine sent for culture      Follow-up Information    Follow up with HARPER,CHARLES A, MD.   Specialty:  Obstetrics and Gynecology   Why:  As scheduled   Contact information:   [address] [place] [address] [phone]       Follow up with [facility] [place] [facility].   Why:  As needed for emergencies   Contact information:   [address] [id number] mc [place] [address] [phone]      Sharen Counter Certified Nurse-Midwife 09/03/2014  11:10 PM

## 2014-09-05 ENCOUNTER — Telehealth: Payer: Self-pay | Admitting: *Deleted

## 2014-09-05 LAB — URINE CULTURE: Culture: 5000

## 2014-09-05 NOTE — Telephone Encounter (Signed)
Patient contacted the office stating she is having body pain. Patient wants to know what she can do. Patient states she was seen at MAU over the weekend. Reviewed comfort measures with patient. Encouraged rest and increasing fluids. Patient verbalized understanding.

## 2014-09-08 ENCOUNTER — Ambulatory Visit (INDEPENDENT_AMBULATORY_CARE_PROVIDER_SITE_OTHER): Payer: Medicaid Other

## 2014-09-08 ENCOUNTER — Ambulatory Visit (INDEPENDENT_AMBULATORY_CARE_PROVIDER_SITE_OTHER): Payer: Medicaid Other | Admitting: Certified Nurse Midwife

## 2014-09-08 VITALS — BP 112/73 | HR 75 | Temp 98.1°F | Wt 171.2 lb

## 2014-09-08 DIAGNOSIS — B373 Candidiasis of vulva and vagina: Secondary | ICD-10-CM

## 2014-09-08 DIAGNOSIS — B3731 Acute candidiasis of vulva and vagina: Secondary | ICD-10-CM

## 2014-09-08 DIAGNOSIS — N898 Other specified noninflammatory disorders of vagina: Secondary | ICD-10-CM

## 2014-09-08 DIAGNOSIS — Z36 Encounter for antenatal screening of mother: Secondary | ICD-10-CM

## 2014-09-08 DIAGNOSIS — Z3482 Encounter for supervision of other normal pregnancy, second trimester: Secondary | ICD-10-CM

## 2014-09-08 DIAGNOSIS — O26892 Other specified pregnancy related conditions, second trimester: Secondary | ICD-10-CM

## 2014-09-08 LAB — POCT URINALYSIS DIPSTICK
Bilirubin, UA: NEGATIVE
Blood, UA: NEGATIVE
Glucose, UA: NORMAL
KETONES UA: NEGATIVE
Leukocytes, UA: NEGATIVE
NITRITE UA: NEGATIVE
PH UA: 6
PROTEIN UA: NEGATIVE
Spec Grav, UA: 1.02
Urobilinogen, UA: NEGATIVE

## 2014-09-08 MED ORDER — FLUCONAZOLE 100 MG PO TABS: 100.0000 mg | ORAL_TABLET | Freq: Once | ORAL | Status: DC

## 2014-09-08 NOTE — Progress Notes (Signed)
Subjective:    Chelsea Wallace is a 24 y.o. female being seen today for her obstetrical visit. She is at [redacted]w[redacted]d gestation. Patient reports: no complaints . Fetal movement: normal.  Problem List Items Addressed This Visit    None    Visit Diagnoses    Vaginal discharge during pregnancy in second trimester    -  Primary    Relevant Orders    SureSwab, Vaginosis/Vaginitis Plus    Vulvovaginal candidiasis        Relevant Medications    fluconazole (DIFLUCAN) 100 MG tablet    Encounter for supervision of other normal pregnancy in second trimester        Relevant Orders    POCT urinalysis dipstick (Completed)      Patient Active Problem List   Diagnosis Date Noted  . Chlamydia infection during pregnancy, antepartum 06/17/2014  . GERD without esophagitis 02/04/2013  . Cervicitis and endocervicitis 10/06/2012   Objective:    BP 112/73 mmHg  Pulse 75  Temp(Src) 98.1 F (36.7 C)  Wt 171 lb 3.2 oz (77.656 kg)  LMP 03/15/2014 FHT: 150 BPM  Uterine Size: size equals dates and at U   Cervix:  Long, thick, closed. + cottage cheese discharge on exam.    Assessment:    Pregnancy @ [redacted]w[redacted]d   Doing well.  VVC.   Plan:    OBGCT: discussed. Signs and symptoms of preterm labor: discussed. Sure swab obtained today for TOC.   Labs, problem list reviewed and updated 2 hr GTT planned Follow up in 4 weeks.

## 2014-09-11 LAB — SURESWAB, VAGINOSIS/VAGINITIS PLUS
Atopobium vaginae: 7.2 Log (cells/mL)
C. ALBICANS, DNA: NOT DETECTED
C. GLABRATA, DNA: NOT DETECTED
C. PARAPSILOSIS, DNA: NOT DETECTED
C. TRACHOMATIS RNA, TMA: DETECTED — AB
C. tropicalis, DNA: NOT DETECTED
Gardnerella vaginalis: >8 Log (cells/mL)
LACTOBACILLUS SPECIES: NOT DETECTED Log (cells/mL)
MEGASPHAERA SPECIES: 7.8 Log (cells/mL)
N. gonorrhoeae RNA, TMA: NOT DETECTED
T. VAGINALIS RNA, QL TMA: NOT DETECTED

## 2014-09-13 ENCOUNTER — Other Ambulatory Visit: Payer: Self-pay | Admitting: Certified Nurse Midwife

## 2014-09-13 DIAGNOSIS — A749 Chlamydial infection, unspecified: Secondary | ICD-10-CM

## 2014-09-13 DIAGNOSIS — B9689 Other specified bacterial agents as the cause of diseases classified elsewhere: Secondary | ICD-10-CM

## 2014-09-13 DIAGNOSIS — N760 Acute vaginitis: Secondary | ICD-10-CM

## 2014-09-13 MED ORDER — AZITHROMYCIN 250 MG PO TABS: ORAL_TABLET | ORAL | Status: DC

## 2014-09-13 MED ORDER — TINIDAZOLE 500 MG PO TABS: 2.0000 g | ORAL_TABLET | Freq: Every day | ORAL | Status: AC

## 2014-09-23 ENCOUNTER — Telehealth: Payer: Self-pay | Admitting: *Deleted

## 2014-09-23 NOTE — Telephone Encounter (Signed)
Patient is calling with questions about Hep B and TB skin test. 1:34 Call to patient- LM on VM- No Hep B immunization during pregnancy- can wait until she delivers. She is negative at [facility] testing. She can have testing for TB at any time- skin test or blood draw.

## 2014-09-26 ENCOUNTER — Other Ambulatory Visit: Payer: Medicaid Other

## 2014-09-26 DIAGNOSIS — Z111 Encounter for screening for respiratory tuberculosis: Secondary | ICD-10-CM

## 2014-09-27 ENCOUNTER — Other Ambulatory Visit: Payer: Medicaid Other

## 2014-09-28 LAB — QUANTIFERON TB GOLD ASSAY (BLOOD)
Interferon Gamma Release Assay: NEGATIVE
Mitogen value: 1.36 IU/mL
Quantiferon Nil Value: 0.03 IU/mL
Quantiferon Tb Ag Minus Nil Value: 0 IU/mL
TB AG VALUE: 0.03 [IU]/mL

## 2014-09-29 ENCOUNTER — Other Ambulatory Visit: Payer: Self-pay | Admitting: *Deleted

## 2014-09-29 DIAGNOSIS — O98819 Other maternal infectious and parasitic diseases complicating pregnancy, unspecified trimester: Principal | ICD-10-CM

## 2014-09-29 DIAGNOSIS — A749 Chlamydial infection, unspecified: Secondary | ICD-10-CM

## 2014-10-05 ENCOUNTER — Inpatient Hospital Stay (HOSPITAL_COMMUNITY)
Admission: AD | Admit: 2014-10-05 | Discharge: 2014-10-05 | Disposition: A | Discharge status: Home / Self Care | Payer: Medicaid Other | Source: Ambulatory Visit | Attending: Obstetrics | Admitting: Obstetrics

## 2014-10-05 ENCOUNTER — Encounter (HOSPITAL_COMMUNITY): Payer: Self-pay | Admitting: *Deleted

## 2014-10-05 DIAGNOSIS — M545 Low back pain: Secondary | ICD-10-CM | POA: Insufficient documentation

## 2014-10-05 DIAGNOSIS — O26899 Other specified pregnancy related conditions, unspecified trimester: Secondary | ICD-10-CM

## 2014-10-05 DIAGNOSIS — O1202 Gestational edema, second trimester: Secondary | ICD-10-CM | POA: Diagnosis not present

## 2014-10-05 DIAGNOSIS — R1030 Lower abdominal pain, unspecified: Secondary | ICD-10-CM | POA: Diagnosis not present

## 2014-10-05 DIAGNOSIS — R109 Unspecified abdominal pain: Secondary | ICD-10-CM

## 2014-10-05 DIAGNOSIS — O98812 Other maternal infectious and parasitic diseases complicating pregnancy, second trimester: Secondary | ICD-10-CM

## 2014-10-05 DIAGNOSIS — O9989 Other specified diseases and conditions complicating pregnancy, childbirth and the puerperium: Secondary | ICD-10-CM | POA: Diagnosis not present

## 2014-10-05 DIAGNOSIS — O219 Vomiting of pregnancy, unspecified: Secondary | ICD-10-CM

## 2014-10-05 DIAGNOSIS — Z3A23 23 weeks gestation of pregnancy: Secondary | ICD-10-CM | POA: Diagnosis not present

## 2014-10-05 DIAGNOSIS — A749 Chlamydial infection, unspecified: Secondary | ICD-10-CM

## 2014-10-05 DIAGNOSIS — O26892 Other specified pregnancy related conditions, second trimester: Secondary | ICD-10-CM | POA: Diagnosis not present

## 2014-10-05 DIAGNOSIS — M62830 Muscle spasm of back: Secondary | ICD-10-CM

## 2014-10-05 LAB — URINALYSIS, ROUTINE W REFLEX MICROSCOPIC
BILIRUBIN URINE: NEGATIVE
Glucose, UA: NEGATIVE mg/dL
Hgb urine dipstick: NEGATIVE
KETONES UR: 15 mg/dL — AB
NITRITE: NEGATIVE
PROTEIN: NEGATIVE mg/dL
SPECIFIC GRAVITY, URINE: 1.025 (ref 1.005–1.030)
UROBILINOGEN UA: 1 mg/dL (ref 0.0–1.0)
pH: 7.5 (ref 5.0–8.0)

## 2014-10-05 LAB — URINE MICROSCOPIC-ADD ON

## 2014-10-05 MED ORDER — PROMETHAZINE HCL 25 MG PO TABS: 25.0000 mg | ORAL_TABLET | Freq: Four times a day (QID) | ORAL | Status: DC | PRN

## 2014-10-05 MED ORDER — CYCLOBENZAPRINE HCL 10 MG PO TABS: 10.0000 mg | ORAL_TABLET | Freq: Three times a day (TID) | ORAL | Status: DC | PRN

## 2014-10-05 NOTE — Discharge Instructions (Signed)
Safe Sex Safe sex is about reducing the risk of giving or getting a sexually transmitted disease (STD). STDs are spread through sexual contact involving the genitals, mouth, or rectum. Some STDs can be cured and others cannot. Safe sex can also prevent unintended pregnancies.  WHAT ARE SOME SAFE SEX PRACTICES?  Limit your sexual activity to only one partner who is having sex with only you.  Talk to your partner about his or her past partners, past STDs, and drug use.  Use a condom every time you have sexual intercourse. This includes vaginal, oral, and anal sexual activity. Both females and males should wear condoms during oral sex. Only use latex or polyurethane condoms and water-based lubricants. Using petroleum-based lubricants or oils to lubricate a condom will weaken the condom and increase the chance that it will break. The condom should be in place from the beginning to the end of sexual activity. Wearing a condom reduces, but does not completely eliminate, your risk of getting or giving an STD. STDs can be spread by contact with infected body fluids and skin.  Get vaccinated for hepatitis B and HPV.  Avoid alcohol and recreational drugs, which can affect your judgment. You may forget to use a condom or participate in high-risk sex.  For females, avoid douching after sexual intercourse. Douching can spread an infection farther into the reproductive tract.  Check your body for signs of sores, blisters, rashes, or unusual discharge. See your health care provider if you notice any of these signs.  Avoid sexual contact if you have symptoms of an infection or are being treated for an STD. If you or your partner has herpes, avoid sexual contact when blisters are present. Use condoms at all other times.  If you are at risk of being infected with HIV, it is recommended that you take a prescription medicine daily to prevent HIV infection. This is called pre-exposure prophylaxis (PrEP). You are  considered at risk if:  You are a man who has sex with other men (MSM).  You are a heterosexual man or woman who is sexually active with more than one partner.  You take drugs by injection.  You are sexually active with a partner who has HIV.  Talk with your health care provider about whether you are at high risk of being infected with HIV. If you choose to begin PrEP, you should first be tested for HIV. You should then be tested every 3 months for as long as you are taking PrEP.  See your health care provider for regular screenings, exams, and tests for other STDs. Before having sex with a new partner, each of you should be screened for STDs and should talk about the results with each other. WHAT ARE THE BENEFITS OF SAFE SEX?   There is less chance of getting or giving an STD.  You can prevent unwanted or unintended pregnancies.  By discussing safe sex concerns with your partner, you may increase feelings of intimacy, comfort, trust, and honesty between the two of you. Document Released: 02/08/2004 Document Revised: 05/17/2013 Document Reviewed: 06/24/2011 [facility] Patient Information 2015 [place], [place]. This information is not intended to replace advice given to you by your health care provider. Make sure you discuss any questions you have with your health care provider. Back Pain in Pregnancy Back pain during pregnancy is common. It happens in about half of all pregnancies. It is important for you and your baby that you remain active during your pregnancy.If you feel that  back pain is not allowing you to remain active or sleep well, it is time to see your caregiver. Back pain may be caused by several factors related to changes during your pregnancy.Fortunately, unless you had trouble with your back before your pregnancy, the pain is likely to get better after you deliver. Low back pain usually occurs between the fifth and seventh months of pregnancy. It can, however, happen in the  first couple months. Factors that increase the risk of back problems include:   Previous back problems.  Injury to your back.  Having twins or multiple births.  A chronic cough.  Stress.  Job-related repetitive motions.  Muscle or spinal disease in the back.  Family history of back problems, ruptured (herniated) discs, or osteoporosis.  Depression, anxiety, and panic attacks. CAUSES   When you are pregnant, your body produces a hormone called relaxin. This hormonemakes the ligaments connecting the low back and pubic bones more flexible. This flexibility allows the baby to be delivered more easily. When your ligaments are loose, your muscles need to work harder to support your back. Soreness in your back can come from tired muscles. Soreness can also come from back tissues that are irritated since they are receiving less support.  As the baby grows, it puts pressure on the nerves and blood vessels in your pelvis. This can cause back pain.  As the baby grows and gets heavier during pregnancy, the uterus pushes the stomach muscles forward and changes your center of gravity. This makes your back muscles work harder to maintain good posture. SYMPTOMS  Lumbar pain during pregnancy Lumbar pain during pregnancy usually occurs at or above the waist in the center of the back. There may be pain and numbness that radiates into your leg or foot. This is similar to low back pain experienced by non-pregnant women. It usually increases with sitting for long periods of time, standing, or repetitive lifting. Tenderness may also be present in the muscles along your upper back. Posterior pelvic pain during pregnancy Pain in the back of the pelvis is more common than lumbar pain in pregnancy. It is a deep pain felt in your side at the waistline, or across the tailbone (sacrum), or in both places. You may have pain on one or both sides. This pain can also go into the buttocks and backs of the upper thighs.  Pubic and groin pain may also be present. The pain does not quickly resolve with rest, and morning stiffness may also be present. Pelvic pain during pregnancy can be brought on by most activities. A high level of fitness before and during pregnancy may or may not prevent this problem. Labor pain is usually 1 to 2 minutes apart, lasts for about 1 minute, and involves a bearing down feeling or pressure in your pelvis. However, if you are at term with the pregnancy, constant low back pain can be the beginning of early labor, and you should be aware of this. DIAGNOSIS  X-rays of the back should not be done during the first 12 to 14 weeks of the pregnancy and only when absolutely necessary during the rest of the pregnancy. MRIs do not give off radiation and are safe during pregnancy. MRIs also should only be done when absolutely necessary. HOME CARE INSTRUCTIONS  Exercise as directed by your caregiver. Exercise is the most effective way to prevent or manage back pain. If you have a back problem, it is especially important to avoid sports that require sudden body movements.  Swimming and walking are great activities.  Do not stand in one place for long periods of time.  Do not wear high heels.  Sit in chairs with good posture. Use a pillow on your lower back if necessary. Make sure your head rests over your shoulders and is not hanging forward.  Try sleeping on your side, preferably the left side, with a pillow or two between your legs. If you are sore after a night's rest, your bedmay betoo soft.Try placing a board between your mattress and box spring.  Listen to your body when lifting.If you are experiencing pain, ask for help or try bending yourknees more so you can use your leg muscles rather than your back muscles. Squat down when picking up something from the floor. Do not bend over.  Eat a healthy diet. Try to gain weight within your caregiver's recommendations.  Use heat or cold packs 3 to  4 times a day for 15 minutes to help with the pain.  Only take over-the-counter or prescription medicines for pain, discomfort, or fever as directed by your caregiver. Sudden (acute) back pain  Use bed rest for only the most extreme, acute episodes of back pain. Prolonged bed rest over 48 hours will aggravate your condition.  Ice is very effective for acute conditions.  Put ice in a plastic bag.  Place a towel between your skin and the bag.  Leave the ice on for 10 to 20 minutes every 2 hours, or as needed.  Using heat packs for 30 minutes prior to activities is also helpful. Continued back pain See your caregiver if you have continued problems. Your caregiver can help or refer you for appropriate physical therapy. With conditioning, most back problems can be avoided. Sometimes, a more serious issue may be the cause of back pain. You should be seen right away if new problems seem to be developing. Your caregiver may recommend:  A maternity girdle.  An elastic sling.  A back brace.  A massage therapist or acupuncture. SEEK MEDICAL CARE IF:   You are not able to do most of your daily activities, even when taking the pain medicine you were given.  You need a referral to a physical therapist or chiropractor.  You want to try acupuncture. SEEK IMMEDIATE MEDICAL CARE IF:  You develop numbness, tingling, weakness, or problems with the use of your arms or legs.  You develop severe back pain that is no longer relieved with medicines.  You have a sudden change in bowel or bladder control.  You have increasing pain in other areas of the body.  You develop shortness of breath, dizziness, or fainting.  You develop nausea, vomiting, or sweating.  You have back pain which is similar to labor pains.  You have back pain along with your water breaking or vaginal bleeding.  You have back pain or numbness that travels down your leg.  Your back pain developed after you fell.  You  develop pain on one side of your back. You may have a kidney stone.  You see blood in your urine. You may have a bladder infection or kidney stone.  You have back pain with blisters. You may have shingles. Back pain is fairly common during pregnancy but should not be accepted as just part of the process. Back pain should always be treated as soon as possible. This will make your pregnancy as pleasant as possible. Document Released: 04/10/2005 Document Revised: 03/25/2011 Document Reviewed: 05/22/2010 [employer] Patient Information 2015 [employer]. This information is not intended to replace advice given to you by your health care provider. Make sure you discuss any questions you have with your health care provider. Chlamydia Chlamydia is an infection. It is spread through sexual contact. Chlamydia can be in different areas of the body. These areas include the cervix, urethra, throat, or rectum. You may not know you have chlamydia because many people never develop the symptoms. Chlamydia is not difficult to treat once you know you have it. However, if it is left untreated, chlamydia can lead to more serious health problems.  CAUSES  Chlamydia is caused by bacteria. It is a sexually transmitted disease. It is passed from an infected partner during intimate contact. This contact could be with the genitals, mouth, or rectal area. Chlamydia can also be passed from mothers to babies during birth. SIGNS AND SYMPTOMS  There may not be any symptoms. This is often the case early in the infection. If symptoms develop, they may include:  Mild pain and discomfort when urinating.  Redness, soreness, and swelling (inflammation) of the rectum.  Vaginal discharge.  Painful intercourse.  Abdominal pain.  Bleeding between menstrual periods. DIAGNOSIS  To diagnose this infection, your health care provider will do a pelvic exam. Cultures will be taken of the vagina, cervix, urine, and possibly the rectum to  verify the diagnosis.  TREATMENT You will be given antibiotic medicines. If you are pregnant, certain types of antibiotics will need to be avoided. Any sexual partners should also be treated, even if they do not show symptoms.  HOME CARE INSTRUCTIONS   Take your antibiotic medicine as directed by your health care provider. Finish the antibiotic even if you start to feel better.  Take medicines only as directed by your health care provider.  Inform any sexual partners about the infection. They should also be treated.  Do not have sexual contact until your health care provider tells you it is okay.  Get plenty of rest.  Eat a well-balanced diet.  Drink enough fluids to keep your urine clear or pale yellow.  Keep all follow-up visits as directed by your health care provider. SEEK MEDICAL CARE IF:  You have painful urination.  You have abdominal pain.  You have vaginal discharge.  You have painful sexual intercourse.  You have bleeding between periods and after sex.  You have a fever. SEEK IMMEDIATE MEDICAL CARE IF:   You experience nausea or vomiting.  You experience excessive sweating (diaphoresis).  You have difficulty swallowing. MAKE SURE YOU:   Understand these instructions.  Will watch your condition.  Will get help right away if you are not doing well or get worse. Document Released: 10/10/2004 Document Revised: 05/17/2013 Document Reviewed: 09/07/2012 [facility] Patient Information 2015 [place], [place]. This information is not intended to replace advice given to you by your health care provider. Make sure you discuss any questions you have with your health care provider.

## 2014-10-05 NOTE — MAU Note (Signed)
Pt C/O swelling in her hands & feet at random times 3-4 days ago, when she has swelling she also has HA.  Vomiting since yesterday, lower abd & back pain.  Denies bleeding or discharge.  No swelling noted @ present.

## 2014-10-05 NOTE — MAU Provider Note (Signed)
History     CSN: [id number]  Arrival date and time: 10/05/14 1247   First Provider Initiated Contact with Patient 10/05/14 1508      Chief Complaint  Patient presents with  . Headache  . Emesis During Pregnancy  . swelling in feet    This is a 24 y.o. female at [redacted]w[redacted]d who presents with multiple complaints. She states she has nausea and vomiting which started yesterday. Also c/o lower abdominal pain and low back pain. Has a history of scoliosis with muscle spasm, this feels like that.  Has had chlamydia + on 06/14/14, 08/09/14, and 09/08/14.  States took Zithromax this past Monday (2 days ago).  Does not know if her partner took any. States "he says there is something with his insurance".  Never saw him take med. States has not had sex since Monday.  Denies bleeding. Also has had some intermittent swelling of feet, none now.     Abdominal Pain This is a recurrent problem. The current episode started in the past 7 days. The onset quality is gradual. The problem occurs intermittently. The problem has been unchanged. The pain is located in the suprapubic region, LLQ and RLQ. The quality of the pain is cramping. The abdominal pain does not radiate. Associated symptoms include nausea and vomiting. Pertinent negatives include no constipation, diarrhea, fever, frequency, headaches or myalgias. Nothing aggravates the pain. The pain is relieved by nothing. She has tried nothing for the symptoms. There is no history of abdominal surgery.   RN Note: Pt C/O swelling in her hands & feet at random times 3-4 days ago, when she has swelling she also has HA. Vomiting since yesterday, lower abd & back pain. Denies bleeding or discharge. No swelling noted @ present.          OB History    Gravida Para Term Preterm AB TAB SAB Ectopic Multiple Living   Past Medical History  Diagnosis Date  . GERD (gastroesophageal reflux disease)     Past Surgical History  Procedure  Laterality Date  . Mandible reconstruction      lower jaw surgery 2011    Family History  Problem Relation Age of Onset  . Diabetes Mother   . Diabetes Maternal Grandfather   . Hypertension Paternal Grandmother   . Hypertension Paternal Grandfather     Social History  Substance Use Topics  . Smoking status: Never Smoker   . Smokeless tobacco: Never Used  . Alcohol Use: No    Allergies: No Known Allergies  Prescriptions prior to admission  Medication Sig Dispense Refill Last Dose  . azithromycin (ZITHROMAX) 250 MG tablet Take 4 tablets now by mouth all at one time. 4 tablet 0   . fluconazole (DIFLUCAN) 100 MG tablet Take 1 tablet (100 mg total) by mouth once. Repeat dose in 48-72 hour. 3 tablet 0   . omeprazole (PRILOSEC) 20 MG capsule Take 1 capsule (20 mg total) by mouth 2 (two) times daily before a meal. (Patient not taking: Reported on 09/08/2014) 60 capsule 5 Not Taking  . Prenat w/o A-[id number] (CITRANATAL ASSURE) 35-1 & 300 MG tablet Take 1 tablet by mouth daily. 30 tablet 12 Taking  . ranitidine (ZANTAC) 150 MG tablet Take 1 tablet (150 mg total) by mouth 2 (two) times daily. (Patient not taking: Reported on 09/08/2014) 30 tablet 0 Not Taking   Medical, Surgical, Family and Social histories reviewed and  are listed above.  Medications and allergies reviewed.   Review of Systems  Constitutional: Negative for fever, chills and malaise/fatigue.  Eyes: Negative for blurred vision.  Gastrointestinal: Positive for nausea, vomiting and abdominal pain. Negative for diarrhea and constipation.  Genitourinary: Negative for frequency.  Musculoskeletal: Positive for back pain. Negative for myalgias.  Neurological: Negative for headaches.  Other systems negative  Physical Exam   Blood pressure 119/61, pulse 93, temperature 97.6 F (36.4 C), temperature source Oral, resp. rate 18, last menstrual period 03/15/2014, not currently breastfeeding.  Physical Exam   Constitutional: She is oriented to person, place, and time. She appears well-developed and well-nourished. No distress.  HENT:  Head: Normocephalic.  Cardiovascular: Normal rate, regular rhythm and normal heart sounds.  Exam reveals no gallop and no friction rub.   No murmur heard. Respiratory: Effort normal and breath sounds normal. No respiratory distress. She has no wheezes. She has no rales.  GI: Soft. She exhibits no distension. There is no tenderness. There is no rebound and no guarding.  Genitourinary: Vagina normal. No vaginal discharge found.  Cervix long and closed Fundal height c/w dates nontender   Musculoskeletal: Normal range of motion. She exhibits tenderness (over lumbar spine). She exhibits no edema.  Neurological: She is alert and oriented to person, place, and time.  Skin: Skin is warm and dry.  Psychiatric: She has a normal mood and affect.    MAU Course  Procedures  MDM Results for orders placed or performed during the hospital encounter of 10/05/14 (from the past 72 hour(s))  Urinalysis, Routine w reflex microscopic (not at [facility])     Status: Abnormal   Collection Time: 10/05/14  1:20 PM  Result Value Ref Range   Color, Urine YELLOW YELLOW   APPearance CLEAR CLEAR   Specific Gravity, Urine 1.025 1.005 - 1.030   pH 7.5 5.0 - 8.0   Glucose, UA NEGATIVE NEGATIVE mg/dL   Hgb urine dipstick NEGATIVE NEGATIVE   Bilirubin Urine NEGATIVE NEGATIVE   Ketones, ur 15 (A) NEGATIVE mg/dL   Protein, ur NEGATIVE NEGATIVE mg/dL   Urobilinogen, UA 1.0 0.0 - 1.0 mg/dL   Nitrite NEGATIVE NEGATIVE   Leukocytes, UA SMALL (A) NEGATIVE  Urine microscopic-add on     Status: Abnormal   Collection Time: 10/05/14  1:20 PM  Result Value Ref Range   Squamous Epithelial / LPF MANY (A) RARE   WBC, UA 3-6 <3 WBC/hpf   RBC / HPF 0-2 <3 RBC/hpf   Bacteria, UA MANY (A) RARE   Urine-Other MUCOUS PRESENT    Could not give Flexeril tonight due to her driving  Assessment and Plan  A:   SIUP at [redacted]w[redacted]d        Lower abdominal pain, probably related to chlamydia       Low back pain, probably related to spasm from scoliosis       Intermittent LE edema, none now, and she is normotensive  P:  Discharge home       Discussed frequent chlamydia infections, including risk of preterm labor and other problems for baby       Discussed need to get partner treated and make sure he takes med then abstain for 2 wks after treatment. Expedited partner treatment Rx provided along with documentation for her to give to him       Rx Flexeril for PRN use with warnings not to drive after taking it        Reassured edema is probably dependent edema,  not preeclampsia       Followup in office for prenatal care   [facility] 10/05/2014, 3:09 PM

## 2014-10-06 ENCOUNTER — Ambulatory Visit (INDEPENDENT_AMBULATORY_CARE_PROVIDER_SITE_OTHER): Payer: Self-pay | Admitting: Certified Nurse Midwife

## 2014-10-06 VITALS — BP 106/68 | HR 86 | Temp 98.8°F | Wt 176.0 lb

## 2014-10-06 DIAGNOSIS — A749 Chlamydial infection, unspecified: Secondary | ICD-10-CM

## 2014-10-06 MED ORDER — AZITHROMYCIN 250 MG PO TABS
ORAL_TABLET | ORAL | Status: DC
Start: 2014-10-06 — End: 2014-11-22

## 2014-10-06 NOTE — Progress Notes (Signed)
Subjective:    Chelsea Wallace is a 24 y.o. female being seen today for her obstetrical visit. She is at [redacted]w[redacted]d gestation. Patient reports: backache, no bleeding, no contractions, no cramping and no leaking. States that she has used a heating pad/ ice compression to her back along with other comfort measures.  States that they are not helping.  Fetal movement: normal.  Problem List Items Addressed This Visit    None    Visit Diagnoses    Chlamydia    -  Primary    Relevant Medications    azithromycin (ZITHROMAX) 250 MG tablet      Patient Active Problem List   Diagnosis Date Noted  . Chlamydia infection during pregnancy, antepartum 06/17/2014  . GERD without esophagitis 02/04/2013  . Cervicitis and endocervicitis 10/06/2012   Objective:    BP 106/68 mmHg  Pulse 86  Temp(Src) 98.8 F (37.1 C)  Wt 176 lb (79.833 kg)  LMP 03/15/2014 FHT: 150 BPM  Uterine Size: size equals dates     Assessment:    Pregnancy @ [redacted]w[redacted]d     Lumbar back pain d/t fetal position and pregnancy  Plan:   Rx for abdominal maternity support belt  OBGCT: discussed and ordered for next visit. Signs and symptoms of preterm labor: discussed.  Labs, problem list reviewed and updated 2 hr GTT planned for next ROB visit Follow up in 4 weeks.

## 2014-11-01 ENCOUNTER — Telehealth: Payer: Self-pay | Admitting: Obstetrics

## 2014-11-02 ENCOUNTER — Ambulatory Visit (INDEPENDENT_AMBULATORY_CARE_PROVIDER_SITE_OTHER): Payer: Self-pay | Admitting: Certified Nurse Midwife

## 2014-11-02 ENCOUNTER — Encounter: Payer: Self-pay | Admitting: *Deleted

## 2014-11-02 ENCOUNTER — Encounter: Payer: Self-pay | Admitting: Obstetrics

## 2014-11-02 ENCOUNTER — Other Ambulatory Visit: Payer: Medicaid Other

## 2014-11-02 VITALS — BP 118/74 | HR 90 | Temp 97.6°F | Wt 181.0 lb

## 2014-11-02 DIAGNOSIS — Z3490 Encounter for supervision of normal pregnancy, unspecified, unspecified trimester: Secondary | ICD-10-CM

## 2014-11-02 LAB — CBC
HEMATOCRIT: 36.5 % (ref 36.0–46.0)
HEMOGLOBIN: 11.9 g/dL — AB (ref 12.0–15.0)
MCH: 30.7 pg (ref 26.0–34.0)
MCHC: 32.6 g/dL (ref 30.0–36.0)
MCV: 94.3 fL (ref 78.0–100.0)
MPV: 12.6 fL — ABNORMAL HIGH (ref 8.6–12.4)
Platelets: 161 10*3/uL (ref 150–400)
RBC: 3.87 MIL/uL (ref 3.87–5.11)
RDW: 14.2 % (ref 11.5–15.5)
WBC: 7 10*3/uL (ref 4.0–10.5)

## 2014-11-02 LAB — POCT URINALYSIS DIPSTICK
BILIRUBIN UA: NEGATIVE
Glucose, UA: 50
KETONES UA: NEGATIVE
Nitrite, UA: NEGATIVE
PH UA: 7
RBC UA: NEGATIVE
SPEC GRAV UA: 1.01
Urobilinogen, UA: NEGATIVE

## 2014-11-02 NOTE — Progress Notes (Signed)
Patient is experiencing pain and pressure at work- she is on her feet all shift and after 5 hour she gets tired with pain. She is using her support belt and will continue to do so. etter with restrictions composed for patient.

## 2014-11-02 NOTE — Progress Notes (Signed)
Subjective:    Chelsea Wallace is a 24 y.o. female being seen today for her obstetrical visit. She is at 6649w1d gestation. Patient reports: no complaints . Fetal movement: normal.  Problem List Items Addressed This Visit    None    Visit Diagnoses    Prenatal care, unspecified trimester    -  Primary    Relevant Orders    POCT urinalysis dipstick (Completed)    Glucose Tolerance, 2 Hours w/1 Hour    CBC    HIV antibody    RPR      Patient Active Problem List   Diagnosis Date Noted  . Chlamydia infection during pregnancy, antepartum 06/17/2014  . GERD without esophagitis 02/04/2013  . Cervicitis and endocervicitis 10/06/2012   Objective:    BP 118/74 mmHg  Pulse 90  Temp(Src) 97.6 F (36.4 C)  Wt 181 lb (82.101 kg)  LMP 03/15/2014 FHT: 145 BPM  Uterine Size: size equals dates     Assessment:    Pregnancy @ 7049w1d    Plan:    OBGCT: ordered. Signs and symptoms of preterm labor: discussed.  Labs, problem list reviewed and updated 2 hr GTT today Follow up in 2 weeks.

## 2014-11-03 LAB — GLUCOSE TOLERANCE, 2 HOURS W/ 1HR
GLUCOSE, FASTING: 85 mg/dL (ref 65–99)
Glucose, 1 hour: 115 mg/dL (ref 70–170)
Glucose, 2 hour: 66 mg/dL — ABNORMAL LOW (ref 70–139)

## 2014-11-03 LAB — HIV ANTIBODY (ROUTINE TESTING W REFLEX): HIV 1&2 Ab, 4th Generation: NONREACTIVE

## 2014-11-03 LAB — RPR

## 2014-11-04 NOTE — Telephone Encounter (Signed)
No return calls from patient.  

## 2014-11-08 ENCOUNTER — Ambulatory Visit (INDEPENDENT_AMBULATORY_CARE_PROVIDER_SITE_OTHER): Payer: Medicaid Other | Admitting: Certified Nurse Midwife

## 2014-11-08 ENCOUNTER — Encounter: Payer: Self-pay | Admitting: *Deleted

## 2014-11-08 VITALS — BP 103/69 | HR 97 | Temp 98.4°F | Wt 181.0 lb

## 2014-11-08 DIAGNOSIS — Z3483 Encounter for supervision of other normal pregnancy, third trimester: Secondary | ICD-10-CM

## 2014-11-09 NOTE — Progress Notes (Signed)
Subjective:    Chelsea Wallace is a 24 y.o. female being seen today for her obstetrical visit. She is at 7779w1d gestation. Patient reports backache, no bleeding, no contractions, no cramping and no leaking. Fetal movement: normal.  Patient has had backache that is aggravated by working.  Work letter revised to demonstrate to her employer that she needs light duty.  According to the patient she needs to work and her employer has not let her return to work d/t the last letter that was written for modified assignment of light duty.    Problem List Items Addressed This Visit    None    Visit Diagnoses    Encounter for supervision of other normal pregnancy in third trimester    -  Primary    Relevant Orders    POCT urinalysis dipstick      Patient Active Problem List   Diagnosis Date Noted  . Chlamydia infection during pregnancy, antepartum 06/17/2014  . GERD without esophagitis 02/04/2013  . Cervicitis and endocervicitis 10/06/2012   Objective:    BP 103/69 mmHg  Pulse 97  Temp(Src) 98.4 F (36.9 C)  Wt 181 lb (82.101 kg)  LMP 03/15/2014 FHT:  145 BPM  Uterine Size: size equals dates  Presentation: cephalic     Assessment:    Pregnancy @ 5979w1d weeks   Lumbar back pain  Plan:     labs reviewed, problem list updated Consent signed. GBS planning TDAP offered  Rhogam given for RH negative Pediatrician: discussed. Infant feeding: plans to breastfeed. Maternity leave: discussed. Cigarette smoking: never smoked. Orders Placed This Encounter  Procedures  . POCT urinalysis dipstick   No orders of the defined types were placed in this encounter.   Follow up in 2 Weeks.

## 2014-11-18 ENCOUNTER — Encounter: Payer: Medicaid Other | Admitting: Certified Nurse Midwife

## 2014-11-22 ENCOUNTER — Ambulatory Visit (INDEPENDENT_AMBULATORY_CARE_PROVIDER_SITE_OTHER): Payer: Medicaid Other | Admitting: Certified Nurse Midwife

## 2014-11-22 VITALS — BP 110/73 | HR 98 | Temp 97.9°F | Wt 182.0 lb

## 2014-11-22 DIAGNOSIS — O26893 Other specified pregnancy related conditions, third trimester: Secondary | ICD-10-CM

## 2014-11-22 DIAGNOSIS — Z3493 Encounter for supervision of normal pregnancy, unspecified, third trimester: Secondary | ICD-10-CM

## 2014-11-22 DIAGNOSIS — R12 Heartburn: Secondary | ICD-10-CM

## 2014-11-22 MED ORDER — LANSOPRAZOLE 15 MG PO CPDR: 15.0000 mg | DELAYED_RELEASE_CAPSULE | Freq: Every day | ORAL | Status: DC

## 2014-11-22 NOTE — Progress Notes (Signed)
Patient reports she is doing well today- no concerns. 

## 2014-11-22 NOTE — Progress Notes (Signed)
Subjective:    Chelsea Wallace is a 24 y.o. female being seen today for her obstetrical visit. She is at 4688w0d gestation. Patient reports heartburn, no bleeding, no contractions, no cramping and no leaking. Fetal movement: normal.  Problem List Items Addressed This Visit    None    Visit Diagnoses    Prenatal care, third trimester    -  Primary    Relevant Orders    POCT urinalysis dipstick    Heartburn in pregnancy, third trimester        Relevant Medications    lansoprazole (PREVACID 24HR) 15 MG capsule      Patient Active Problem List   Diagnosis Date Noted  . Chlamydia infection during pregnancy, antepartum 06/17/2014  . GERD without esophagitis 02/04/2013  . Cervicitis and endocervicitis 10/06/2012   Objective:    BP 110/73 mmHg  Pulse 98  Temp(Src) 97.9 F (36.6 C)  Wt 182 lb (82.555 kg)  LMP 03/15/2014 FHT:  150 BPM  Uterine Size: size equals dates  Presentation: cephalic     Assessment:    Pregnancy @ 6388w0d weeks   GERD  Plan:     labs reviewed, problem list updated Consent signed. GBS sent TDAP offered  Rhogam given for RH negative Pediatrician: discussed. Infant feeding: plans to breastfeed. Maternity leave: discussed, not currently working, nursing home is under new management. Cigarette smoking: never smoked. Orders Placed This Encounter  Procedures  . POCT urinalysis dipstick   Meds ordered this encounter  Medications  . lansoprazole (PREVACID 24HR) 15 MG capsule    Sig: Take 1 capsule (15 mg total) by mouth daily at 12 noon.    Dispense:  30 capsule    Refill:  4   Follow up in 2 Weeks.

## 2014-11-23 ENCOUNTER — Other Ambulatory Visit: Payer: Self-pay | Admitting: *Deleted

## 2014-11-23 DIAGNOSIS — K219 Gastro-esophageal reflux disease without esophagitis: Secondary | ICD-10-CM

## 2014-11-23 MED ORDER — NEXIUM 40 MG PO PACK: 40.0000 mg | PACK | Freq: Every day | ORAL | Status: DC

## 2014-11-23 NOTE — Progress Notes (Signed)
Previous Rx for Lansoprazole is not covered by insurance.  In review with R Denney it is recommended to try Nexium. Rx for Brand was sent to pharmacy.

## 2014-11-24 ENCOUNTER — Telehealth: Payer: Self-pay | Admitting: *Deleted

## 2014-11-24 ENCOUNTER — Other Ambulatory Visit: Payer: Self-pay | Admitting: Certified Nurse Midwife

## 2014-11-24 NOTE — Telephone Encounter (Signed)
Patient states she is having some leakage from her breat and wants to know if that is normal. 3:02 LM on VM- it can happen during pregnancy due to the changes in hormones. Try not to stimulate because that can make it worse.

## 2014-11-25 ENCOUNTER — Other Ambulatory Visit: Payer: Self-pay | Admitting: *Deleted

## 2014-12-06 ENCOUNTER — Ambulatory Visit (INDEPENDENT_AMBULATORY_CARE_PROVIDER_SITE_OTHER): Payer: Medicaid Other | Admitting: Certified Nurse Midwife

## 2014-12-06 VITALS — BP 120/77 | HR 94 | Wt 184.0 lb

## 2014-12-06 DIAGNOSIS — Z3483 Encounter for supervision of other normal pregnancy, third trimester: Secondary | ICD-10-CM

## 2014-12-06 LAB — POCT URINALYSIS DIPSTICK
Bilirubin, UA: NEGATIVE
Blood, UA: NEGATIVE
Glucose, UA: NEGATIVE
Ketones, UA: NEGATIVE
LEUKOCYTES UA: NEGATIVE
Nitrite, UA: NEGATIVE
PROTEIN UA: NEGATIVE
Spec Grav, UA: 1.015
UROBILINOGEN UA: NEGATIVE
pH, UA: 6.5

## 2014-12-06 NOTE — Progress Notes (Signed)
Subjective:    Chelsea Wallace is a 24 y.o. female being seen today for her obstetrical visit. She is at 1535w0d gestation. Patient reports no bleeding, no contractions, no cramping and states fluid leaking vaginally and occasional spotting when wiping.  States that she does not have to wear a panty liner, states increased fluid when she stands up. Fetal movement: normal.  Problem List Items Addressed This Visit    None    Visit Diagnoses    Encounter for supervision of other normal pregnancy in third trimester    -  Primary    Relevant Orders    POCT urinalysis dipstick (Completed)    SureSwab, Vaginosis/Vaginitis Plus      Patient Active Problem List   Diagnosis Date Noted  . Chlamydia infection during pregnancy, antepartum 06/17/2014  . GERD without esophagitis 02/04/2013  . Cervicitis and endocervicitis 10/06/2012   Objective:    BP 120/77 mmHg  Pulse 94  Wt 184 lb (83.462 kg)  LMP 03/15/2014 FHT:  140 BPM  Uterine Size: size equals dates  Presentation: cephalic   Cervix: 1 cm dilated, posterior, soft, long, floating presentation  Negative fern, negative Nitrazine paper Assessment:    Pregnancy @ 4535w0d weeks   Doing well  Leukorrhea of pregnancy Plan:     labs reviewed, problem list updated Consent signed. GBS planning TDAP offered  Rhogam given for RH negative Pediatrician: discussed. Infant feeding: plans to breastfeed. Maternity leave: discussed. Cigarette smoking: never smoked. Orders Placed This Encounter  Procedures  . SureSwab, Vaginosis/Vaginitis Plus  . POCT urinalysis dipstick   No orders of the defined types were placed in this encounter.   Follow up in 2 Weeks.

## 2014-12-10 LAB — SURESWAB, VAGINOSIS/VAGINITIS PLUS
Atopobium vaginae: NOT DETECTED Log (cells/mL)
C. ALBICANS, DNA: DETECTED — AB
C. GLABRATA, DNA: NOT DETECTED
C. TRACHOMATIS RNA, TMA: DETECTED — AB
C. TROPICALIS, DNA: NOT DETECTED
C. parapsilosis, DNA: NOT DETECTED
LACTOBACILLUS SPECIES: DETECTED Log (cells/mL)
MEGASPHAERA SPECIES: NOT DETECTED Log (cells/mL)
N. gonorrhoeae RNA, TMA: NOT DETECTED
T. VAGINALIS RNA, QL TMA: NOT DETECTED

## 2014-12-13 ENCOUNTER — Other Ambulatory Visit: Payer: Self-pay | Admitting: Certified Nurse Midwife

## 2014-12-13 DIAGNOSIS — A749 Chlamydial infection, unspecified: Secondary | ICD-10-CM

## 2014-12-13 DIAGNOSIS — B373 Candidiasis of vulva and vagina: Secondary | ICD-10-CM

## 2014-12-13 DIAGNOSIS — B3731 Acute candidiasis of vulva and vagina: Secondary | ICD-10-CM

## 2014-12-13 DIAGNOSIS — O98819 Other maternal infectious and parasitic diseases complicating pregnancy, unspecified trimester: Principal | ICD-10-CM

## 2014-12-13 MED ORDER — AMOXICILLIN-POT CLAVULANATE 875-125 MG PO TABS: 1.0000 | ORAL_TABLET | Freq: Two times a day (BID) | ORAL | Status: DC

## 2014-12-13 MED ORDER — FLUCONAZOLE 100 MG PO TABS
100.0000 mg | ORAL_TABLET | Freq: Once | ORAL | Status: DC
Start: 2014-12-13 — End: 2014-12-20

## 2014-12-20 ENCOUNTER — Ambulatory Visit (INDEPENDENT_AMBULATORY_CARE_PROVIDER_SITE_OTHER): Payer: Medicaid Other | Admitting: Certified Nurse Midwife

## 2014-12-20 VITALS — BP 109/71 | HR 101 | Temp 98.0°F | Wt 183.0 lb

## 2014-12-20 DIAGNOSIS — A749 Chlamydial infection, unspecified: Secondary | ICD-10-CM

## 2014-12-20 DIAGNOSIS — B3731 Acute candidiasis of vulva and vagina: Secondary | ICD-10-CM

## 2014-12-20 DIAGNOSIS — O98813 Other maternal infectious and parasitic diseases complicating pregnancy, third trimester: Secondary | ICD-10-CM

## 2014-12-20 DIAGNOSIS — O98313 Other infections with a predominantly sexual mode of transmission complicating pregnancy, third trimester: Secondary | ICD-10-CM

## 2014-12-20 DIAGNOSIS — B373 Candidiasis of vulva and vagina: Secondary | ICD-10-CM

## 2014-12-20 DIAGNOSIS — N760 Acute vaginitis: Secondary | ICD-10-CM

## 2014-12-20 DIAGNOSIS — Z3483 Encounter for supervision of other normal pregnancy, third trimester: Secondary | ICD-10-CM

## 2014-12-20 MED ORDER — TERCONAZOLE 0.4 % VA CREA: 1.0000 | TOPICAL_CREAM | Freq: Every day | VAGINAL | Status: DC

## 2014-12-20 MED ORDER — FLUCONAZOLE 100 MG PO TABS: 100.0000 mg | ORAL_TABLET | Freq: Once | ORAL | Status: DC

## 2014-12-20 MED ORDER — AZITHROMYCIN 250 MG PO TABS: ORAL_TABLET | ORAL | Status: DC

## 2014-12-20 NOTE — Progress Notes (Signed)
Subjective:    Chelsea Wallace is a 24 y.o. female being seen today for her obstetrical visit. She is at 5545w0d gestation. Patient reports no bleeding, no contractions, no cramping, no leaking and vaginal irritation. Fetal movement: normal.  Discussed at length the implications of chlamydia with vaginal delivery.  Patient verbalized understanding.  States that partner was recently treated after she had already completed her treatment and needs to be retreated.  Encouraged patient to abstain from sexual intercourse until she has delivered.    Problem List Items Addressed This Visit    None    Visit Diagnoses    Supervision of other normal pregnancy, antepartum, third trimester    -  Primary    Relevant Medications    fluconazole (DIFLUCAN) 100 MG tablet    terconazole (TERAZOL 7) 0.4 % vaginal cream    Other Relevant Orders    POCT urinalysis dipstick    Chlamydia infection affecting pregnancy in third trimester, antepartum        Relevant Medications    azithromycin (ZITHROMAX) 250 MG tablet    fluconazole (DIFLUCAN) 100 MG tablet    terconazole (TERAZOL 7) 0.4 % vaginal cream    Vaginal yeast infection        Relevant Medications    azithromycin (ZITHROMAX) 250 MG tablet    fluconazole (DIFLUCAN) 100 MG tablet    terconazole (TERAZOL 7) 0.4 % vaginal cream    Vaginitis        Relevant Medications    fluconazole (DIFLUCAN) 100 MG tablet    terconazole (TERAZOL 7) 0.4 % vaginal cream      Patient Active Problem List   Diagnosis Date Noted  . Chlamydia infection during pregnancy, antepartum 06/17/2014  . GERD without esophagitis 02/04/2013  . 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 GBS planning TDAP offered  Rhogam given for RH negative Pediatrician: discussed. Infant feeding: plans to breastfeed. Maternity leave: discussed. Cigarette smoking: never smoked. Orders Placed This Encounter  Procedures  . POCT urinalysis dipstick   Meds ordered this encounter  Medications  . azithromycin (ZITHROMAX) 250 MG tablet    Sig: Take 4 tablets all together now.    Dispense:  4 tablet    Refill:  0  . fluconazole (DIFLUCAN) 100 MG tablet    Sig: Take 1 tablet (100 mg total) by mouth once. Repeat dose in 48-72 hour.    Dispense:  3 tablet    Refill:  0  . terconazole (TERAZOL 7) 0.4 % vaginal cream    Sig: Place 1 applicator vaginally at bedtime.    Dispense:  45 g    Refill:  0   Follow up in 1 Week with repeat cultures and GBS.

## 2014-12-27 ENCOUNTER — Ambulatory Visit (INDEPENDENT_AMBULATORY_CARE_PROVIDER_SITE_OTHER): Payer: Medicaid Other | Admitting: Certified Nurse Midwife

## 2014-12-27 VITALS — BP 117/75 | HR 101 | Wt 184.0 lb

## 2014-12-27 DIAGNOSIS — Z3483 Encounter for supervision of other normal pregnancy, third trimester: Secondary | ICD-10-CM

## 2014-12-27 LAB — POCT URINALYSIS DIPSTICK
BILIRUBIN UA: NEGATIVE
Blood, UA: NEGATIVE
Glucose, UA: NEGATIVE
Nitrite, UA: NEGATIVE
SPEC GRAV UA: 1.015
Urobilinogen, UA: NEGATIVE
pH, UA: 7

## 2014-12-27 NOTE — Progress Notes (Signed)
Subjective:    Chelsea Wallace is a 24 y.o. female being seen today for her obstetrical visit. She is at 4262w0d gestation. Patient reports no bleeding, no contractions and reports loss of mucous plug, reports increased vaginal discharge, denies any bleeding does have increased pressure with occasional contractions.. Fetal movement: normal.  Declines any sexual intercourse since last ROB visit, states that her partner was treated again.    Problem List Items Addressed This Visit    None    Visit Diagnoses    Encounter for supervision of other normal pregnancy in third trimester    -  Primary    Relevant Orders    Strep B DNA probe    POCT urinalysis dipstick (Completed)      Patient Active Problem List   Diagnosis Date Noted  . Chlamydia infection during pregnancy, antepartum 06/17/2014  . GERD without esophagitis 02/04/2013  . Cervicitis and endocervicitis 10/06/2012   Objective:    BP 117/75 mmHg  Pulse 101  Wt 184 lb (83.462 kg)  LMP 03/15/2014 FHT:  130 BPM  Uterine Size: size equals dates  Presentation: cephalic   Cervix:  Long, thick, closed and posterior.  + vaginal discharge.  Neg. nitrazine  Assessment:    Pregnancy @ 4562w0d weeks   Doing well.  Recent Chlamydia infection  Plan:     labs reviewed, problem list updated Consent signed. GBS sent TDAP offered  Rhogam given for RH negative Pediatrician: discussed. Infant feeding: plans to breastfeed. Maternity leave: discussed. Cigarette smoking: never smoked. Orders Placed This Encounter  Procedures  . Strep B DNA probe  . POCT urinalysis dipstick   No orders of the defined types were placed in this encounter.   Follow up in 1 Week.

## 2014-12-27 NOTE — Progress Notes (Signed)
Pt states increase in pressure and possible ctx.

## 2014-12-28 LAB — STREP B DNA PROBE: GBSP: DETECTED

## 2014-12-30 ENCOUNTER — Other Ambulatory Visit: Payer: Self-pay | Admitting: Certified Nurse Midwife

## 2015-01-03 ENCOUNTER — Ambulatory Visit (INDEPENDENT_AMBULATORY_CARE_PROVIDER_SITE_OTHER): Payer: Medicaid Other | Admitting: Certified Nurse Midwife

## 2015-01-03 VITALS — BP 116/77 | HR 91 | Temp 97.8°F | Wt 185.0 lb

## 2015-01-03 DIAGNOSIS — Z3493 Encounter for supervision of normal pregnancy, unspecified, third trimester: Secondary | ICD-10-CM

## 2015-01-03 LAB — POCT URINALYSIS DIPSTICK
Bilirubin, UA: NEGATIVE
Blood, UA: NEGATIVE
Glucose, UA: NEGATIVE
Ketones, UA: 2
Nitrite, UA: NEGATIVE
Spec Grav, UA: 1.02
UROBILINOGEN UA: NEGATIVE
pH, UA: 6

## 2015-01-03 NOTE — Progress Notes (Signed)
Subjective:    Chelsea Wallace is a 24 y.o. female being seen today for her obstetrical visit. She is at 386w0d gestation. Patient reports backache, no bleeding, no contractions, no cramping and no leaking. Fetal movement: normal.  Problem List Items Addressed This Visit    None    Visit Diagnoses    Prenatal care, third trimester    -  Primary    Relevant Orders    POCT urinalysis dipstick (Completed)      Patient Active Problem List   Diagnosis Date Noted  . Chlamydia infection during pregnancy, antepartum 06/17/2014  . GERD without esophagitis 02/04/2013  . Cervicitis and endocervicitis 10/06/2012   Objective:    BP 116/77 mmHg  Pulse 91  Temp(Src) 97.8 F (36.6 C)  Wt 185 lb (83.915 kg)  LMP 03/15/2014 FHT:  130 BPM  Uterine Size: size equals dates  Presentation: cephalic     Assessment:    Pregnancy @ 5486w0d weeks   Plan:     labs reviewed, problem list updated Consent signed. GBS sent TDAP offered  Rhogam given for RH negative Pediatrician: discussed. Infant feeding: plans to breastfeed. Maternity leave: discussed. Cigarette smoking: never smoked. Orders Placed This Encounter  Procedures  . POCT urinalysis dipstick   No orders of the defined types were placed in this encounter.   Follow up in 1 Week.

## 2015-01-03 NOTE — Progress Notes (Signed)
Patient reports more pressure than before- but no contractions

## 2015-01-10 ENCOUNTER — Encounter: Payer: Self-pay | Admitting: *Deleted

## 2015-01-10 ENCOUNTER — Ambulatory Visit (INDEPENDENT_AMBULATORY_CARE_PROVIDER_SITE_OTHER): Payer: Medicaid Other | Admitting: Certified Nurse Midwife

## 2015-01-10 VITALS — BP 112/76 | HR 103 | Temp 98.7°F | Wt 183.0 lb

## 2015-01-10 DIAGNOSIS — O479 False labor, unspecified: Secondary | ICD-10-CM

## 2015-01-10 DIAGNOSIS — Z3493 Encounter for supervision of normal pregnancy, unspecified, third trimester: Secondary | ICD-10-CM

## 2015-01-10 DIAGNOSIS — G4700 Insomnia, unspecified: Secondary | ICD-10-CM

## 2015-01-10 LAB — POCT URINALYSIS DIPSTICK
BILIRUBIN UA: NEGATIVE
GLUCOSE UA: NEGATIVE
Ketones, UA: NEGATIVE
Leukocytes, UA: NEGATIVE
NITRITE UA: NEGATIVE
PH UA: 6
Protein, UA: NEGATIVE
RBC UA: NEGATIVE
SPEC GRAV UA: 1.015
UROBILINOGEN UA: NEGATIVE

## 2015-01-10 MED ORDER — OXYCODONE-ACETAMINOPHEN 5-325 MG PO TABS: 1.0000 | ORAL_TABLET | ORAL | Status: DC | PRN

## 2015-01-10 MED ORDER — HYDROXYZINE PAMOATE 25 MG PO CAPS: 25.0000 mg | ORAL_CAPSULE | Freq: Three times a day (TID) | ORAL | Status: DC | PRN

## 2015-01-10 NOTE — Progress Notes (Signed)
Patient states she has a white creamy discharge. Patient is having some discomfort.

## 2015-01-11 NOTE — Progress Notes (Signed)
Subjective:    Toniann Ketleyah L Kwiecinski is a 24 y.o. female being seen today for her obstetrical visit. She is at 2949w1d gestation. Patient reports backache, no bleeding, no contractions, no cramping, no leaking and insomnia. Fetal movement: normal.  Problem List Items Addressed This Visit    None    Visit Diagnoses    Prenatal care, third trimester    -  Primary    Relevant Orders    SureSwab, Vaginosis/Vaginitis Plus    POCT urinalysis dipstick (Completed)    Braxton Hicks contractions        Relevant Medications    hydrOXYzine (VISTARIL) 25 MG capsule    oxyCODONE-acetaminophen (PERCOCET/ROXICET) 5-325 MG tablet    Insomnia        Relevant Medications    hydrOXYzine (VISTARIL) 25 MG capsule    oxyCODONE-acetaminophen (PERCOCET/ROXICET) 5-325 MG tablet      Patient Active Problem List   Diagnosis Date Noted  . Chlamydia infection during pregnancy, antepartum 06/17/2014  . GERD without esophagitis 02/04/2013  . Cervicitis and endocervicitis 10/06/2012    Objective:    BP 112/76 mmHg  Pulse 103  Temp(Src) 98.7 F (37.1 C)  Wt 183 lb (83.008 kg)  LMP 03/15/2014 FHT: 145 BPM  Uterine Size: size equals dates  Presentations: cephalic  Pelvic Exam:              Dilation: 1cm       Effacement: Long             Station:  -2    Consistency: soft            Position: posterior     Assessment:    Pregnancy @ 7149w1d weeks   Insomnia  Plan:   Plans for delivery: Vaginal anticipated; labs reviewed; problem list updated Counseling: Consent signed. Infant feeding: plans to breastfeed. Cigarette smoking: never smoked. L&D discussion: symptoms of labor, discussed when to call, discussed what number to call, anesthetic/analgesic options reviewed and delivering clinician:  plans no preference. Postpartum supports and preparation: circumcision discussed and contraception plans discussed.  Follow up in 1 Week.

## 2015-01-13 LAB — SURESWAB, VAGINOSIS/VAGINITIS PLUS
Atopobium vaginae: NOT DETECTED Log (cells/mL)
C. ALBICANS, DNA: DETECTED — AB
C. GLABRATA, DNA: NOT DETECTED
C. TROPICALIS, DNA: NOT DETECTED
C. parapsilosis, DNA: NOT DETECTED
C. trachomatis RNA, TMA: DETECTED — AB
GARDNERELLA VAGINALIS: 5.1 Log (cells/mL)
LACTOBACILLUS SPECIES: NOT DETECTED Log (cells/mL)
MEGASPHAERA SPECIES: NOT DETECTED Log (cells/mL)
N. gonorrhoeae RNA, TMA: NOT DETECTED
T. VAGINALIS RNA, QL TMA: NOT DETECTED

## 2015-01-15 NOTE — L&D Delivery Note (Signed)
Delivery Note  This is a 25 year old G 2 P1 who was admitted for Early latent labor.. She progressed normally with Cytotec and epidural to the second stage of labor.  She pushed for 15 min.  Small episiotomy cut for decel into the 60's without return to baseline, she pushed and delivered.  At 5:50 PM she delivered a viable infant female, cephalic, over an intact perineum.  A nuchal cord   was not identified. Infant placed on maternal abdomen.  Delayed cord clamping was performed for 10 minutes.  Cord double clamped and cut.  Apgar scores were 8 and 9. The placenta delivered spontaneously, shultz, with a 3 vessel cord.  Inspection revealed 2nd degree. The uterus was firm bleeding stable.  The repair was done under epidural.   EBL was 50.    Placenta was sent.  There were no complications during the procedure.  Mom and baby skin to skin following delivery. Left in stable condition.  Delivered via Vaginal, Spontaneous Delivery (Presentation: Right Occiput Anterior).  APGAR: 8, 9; weight  .   Placenta status: spontaneous, shultz.  Cord: 3 vessels with the following complications: None.  Cord pH: N/A  Anesthesia: Epidural  Episiotomy: None Lacerations:  2nd deg vaginal Suture Repair: 3.0 vicryl Est. Blood Loss (mL):  50  Mom to postpartum.  Baby to Couplet care / Skin to Skin.  Roe Coombs, CNM 01/31/2015, 6:37 PM

## 2015-01-17 ENCOUNTER — Ambulatory Visit (INDEPENDENT_AMBULATORY_CARE_PROVIDER_SITE_OTHER): Payer: Medicaid Other | Admitting: Certified Nurse Midwife

## 2015-01-17 ENCOUNTER — Other Ambulatory Visit: Payer: Self-pay | Admitting: Certified Nurse Midwife

## 2015-01-17 VITALS — BP 113/77 | HR 92 | Wt 184.0 lb

## 2015-01-17 DIAGNOSIS — Z3483 Encounter for supervision of other normal pregnancy, third trimester: Secondary | ICD-10-CM

## 2015-01-17 DIAGNOSIS — O98813 Other maternal infectious and parasitic diseases complicating pregnancy, third trimester: Principal | ICD-10-CM

## 2015-01-17 DIAGNOSIS — A749 Chlamydial infection, unspecified: Secondary | ICD-10-CM

## 2015-01-17 DIAGNOSIS — N760 Acute vaginitis: Secondary | ICD-10-CM

## 2015-01-17 LAB — POCT URINALYSIS DIPSTICK
Bilirubin, UA: NEGATIVE
Blood, UA: NEGATIVE
Glucose, UA: NEGATIVE
KETONES UA: NEGATIVE
LEUKOCYTES UA: NEGATIVE
NITRITE UA: NEGATIVE
PH UA: 6
Spec Grav, UA: 1.015
UROBILINOGEN UA: NEGATIVE

## 2015-01-17 MED ORDER — AMOXICILLIN-POT CLAVULANATE 875-125 MG PO TABS: 1.0000 | ORAL_TABLET | Freq: Two times a day (BID) | ORAL | Status: DC

## 2015-01-17 MED ORDER — FLUCONAZOLE 100 MG PO TABS
100.0000 mg | ORAL_TABLET | Freq: Once | ORAL | Status: DC
Start: 2015-01-17 — End: 2015-01-31

## 2015-01-17 NOTE — Progress Notes (Signed)
Subjective:    Chelsea Wallace is a 25 y.o. female being seen today for her obstetrical visit. She is at 4563w0d gestation. Patient reports backache, no bleeding, no contractions, no cramping and no leaking. Fetal movement: normal.  States hasn't had sexual intercourse since last treatment, states completed Treatment.  Discussed possible IOL at next ROB.    Problem List Items Addressed This Visit    None    Visit Diagnoses    Encounter for supervision of other normal pregnancy in third trimester    -  Primary    Relevant Orders    POCT urinalysis dipstick (Completed)      Patient Active Problem List   Diagnosis Date Noted  . Chlamydia infection during pregnancy, antepartum 06/17/2014  . GERD without esophagitis 02/04/2013  . Cervicitis and endocervicitis 10/06/2012    Objective:    BP 113/77 mmHg  Pulse 92  Wt 184 lb (83.462 kg)  LMP 03/15/2014 FHT: 145 BPM  Uterine Size: size equals dates  Presentations: cephalic  Pelvic Exam: deferred     Assessment:    Pregnancy @ 9663w0d weeks   doing well.  +CH  Plan:   Plans for delivery: Vaginal anticipated; labs reviewed; problem list updated Counseling: Consent signed. Infant feeding: plans to breastfeed. Cigarette smoking: never smoked. L&D discussion: symptoms of labor, discussed when to call, discussed what number to call, anesthetic/analgesic options reviewed and delivering clinician:  plans no preference. Postpartum supports and preparation: circumcision discussed and contraception plans discussed.  Follow up in 1 Week.

## 2015-01-18 ENCOUNTER — Encounter: Payer: Medicaid Other | Admitting: Certified Nurse Midwife

## 2015-01-24 ENCOUNTER — Encounter: Payer: Self-pay | Admitting: Obstetrics

## 2015-01-24 ENCOUNTER — Ambulatory Visit (INDEPENDENT_AMBULATORY_CARE_PROVIDER_SITE_OTHER): Payer: Medicaid Other | Admitting: Certified Nurse Midwife

## 2015-01-24 VITALS — BP 115/71 | HR 111 | Temp 98.2°F | Wt 187.0 lb

## 2015-01-24 DIAGNOSIS — Z3483 Encounter for supervision of other normal pregnancy, third trimester: Secondary | ICD-10-CM

## 2015-01-24 NOTE — Progress Notes (Signed)
Subjective:    Chelsea Wallace is a 25 y.o. female being seen today for her obstetrical visit. She is at 5929w0d gestation. Patient reports backache, no bleeding, no contractions, no cramping and no leaking. Fetal movement: normal.  States increased back pain last day.    Problem List Items Addressed This Visit    None    Visit Diagnoses    Supervision of other normal pregnancy, antepartum, third trimester    -  Primary    Relevant Orders    POCT urinalysis dipstick      Patient Active Problem List   Diagnosis Date Noted  . Chlamydia infection during pregnancy, antepartum 06/17/2014  . GERD without esophagitis 02/04/2013  . Cervicitis and endocervicitis 10/06/2012    Objective:    BP 115/71 mmHg  Pulse 111  Temp(Src) 98.2 F (36.8 C)  Wt 187 lb (84.823 kg)  LMP 03/15/2014 FHT: 150 BPM  Uterine Size: size equals dates  Presentations: cephalic  Pelvic Exam:              Dilation: 1cm       Effacement: 50%             Station:  -3    Consistency: soft            Position: posterior     Assessment:    Pregnancy @ 2029w0d weeks   Lumbar back pain in pregnancy  Chronic chlamydia during pregnancy  Plan:   Plans for delivery: Vaginal anticipated; labs reviewed; problem list updated Counseling: Consent signed. Infant feeding: plans to breastfeed. Cigarette smoking: never smoked. L&D discussion: symptoms of labor, discussed when to call, discussed what number to call, anesthetic/analgesic options reviewed and delivering clinician:  plans no preference. Postpartum supports and preparation: circumcision discussed and contraception plans discussed.  Follow up in 1 Week.

## 2015-01-26 ENCOUNTER — Encounter (HOSPITAL_COMMUNITY): Payer: Self-pay | Admitting: *Deleted

## 2015-01-26 ENCOUNTER — Inpatient Hospital Stay (HOSPITAL_COMMUNITY)
Admission: AD | Admit: 2015-01-26 | Discharge: 2015-01-26 | Disposition: A | Discharge status: Home / Self Care | Payer: Medicaid Other | Source: Ambulatory Visit | Attending: Obstetrics | Admitting: Obstetrics

## 2015-01-26 DIAGNOSIS — Z3493 Encounter for supervision of normal pregnancy, unspecified, third trimester: Secondary | ICD-10-CM | POA: Insufficient documentation

## 2015-01-26 LAB — AMNISURE RUPTURE OF MEMBRANE (ROM) NOT AT ARMC: Amnisure ROM: NEGATIVE

## 2015-01-26 LAB — POCT FERN TEST: POCT FERN TEST: NEGATIVE

## 2015-01-26 NOTE — MAU Note (Signed)
Pt states she began leaking clear fluid 1 hour ago. Denies ctx or VB. Endorses decreased fetal movement today as well.

## 2015-01-27 ENCOUNTER — Ambulatory Visit (INDEPENDENT_AMBULATORY_CARE_PROVIDER_SITE_OTHER): Payer: Medicaid Other | Admitting: Certified Nurse Midwife

## 2015-01-27 VITALS — BP 108/72 | HR 104 | Temp 98.2°F | Wt 186.0 lb

## 2015-01-27 DIAGNOSIS — Z3483 Encounter for supervision of other normal pregnancy, third trimester: Secondary | ICD-10-CM

## 2015-01-27 DIAGNOSIS — N390 Urinary tract infection, site not specified: Secondary | ICD-10-CM

## 2015-01-27 LAB — POCT URINALYSIS DIPSTICK
Bilirubin, UA: NEGATIVE
GLUCOSE UA: NEGATIVE
Ketones, UA: NEGATIVE
NITRITE UA: NEGATIVE
PH UA: 6
Spec Grav, UA: 1.015
UROBILINOGEN UA: NEGATIVE

## 2015-01-28 LAB — URINE CULTURE

## 2015-01-28 NOTE — Progress Notes (Signed)
Subjective:    Chelsea Wallace is a 25 y.o. female being seen today for her obstetrical visit. She is at 297w4d gestation. Patient reports backache, no bleeding, no leaking and occasional contractions. Fetal movement: normal.  Problem List Items Addressed This Visit    None    Visit Diagnoses    Encounter for supervision of other normal pregnancy in third trimester    -  Primary    Relevant Orders    POCT urinalysis dipstick (Completed)    UTI (lower urinary tract infection)        Relevant Orders    Urine culture      Patient Active Problem List   Diagnosis Date Noted  . Chlamydia infection during pregnancy, antepartum 06/17/2014  . GERD without esophagitis 02/04/2013  . Cervicitis and endocervicitis 10/06/2012    Objective:    BP 108/72 mmHg  Pulse 104  Temp(Src) 98.2 F (36.8 C)  Wt 186 lb (84.369 kg)  LMP 03/15/2014 FHT: 145 BPM  Uterine Size: size equals dates  Presentations: cephalic  Pelvic Exam: deferred     Assessment:    Pregnancy @ 837w4d weeks   Plan:   Plans for delivery: Vaginal anticipated; labs reviewed; problem list updated Counseling: Consent signed. Infant feeding: plans to breastfeed. Cigarette smoking: never smoked. L&D discussion: symptoms of labor, discussed when to call, discussed what number to call, anesthetic/analgesic options reviewed and delivering clinician:  plans no preference. Postpartum supports and preparation: circumcision discussed and contraception plans discussed.  Follow up in 1 Week, IOL scheduled 02/01/15@0730 .

## 2015-01-31 ENCOUNTER — Inpatient Hospital Stay (HOSPITAL_COMMUNITY): Payer: Medicaid Other | Admitting: Anesthesiology

## 2015-01-31 ENCOUNTER — Encounter (HOSPITAL_COMMUNITY): Payer: Self-pay | Admitting: *Deleted

## 2015-01-31 ENCOUNTER — Inpatient Hospital Stay (HOSPITAL_COMMUNITY)
Admission: AD | Admit: 2015-01-31 | Discharge: 2015-02-02 | DRG: 775 | Disposition: A | Discharge status: Home / Self Care | Payer: Medicaid Other | Source: Ambulatory Visit | Attending: Obstetrics | Admitting: Obstetrics

## 2015-01-31 DIAGNOSIS — Z833 Family history of diabetes mellitus: Secondary | ICD-10-CM | POA: Diagnosis not present

## 2015-01-31 DIAGNOSIS — N390 Urinary tract infection, site not specified: Secondary | ICD-10-CM

## 2015-01-31 DIAGNOSIS — K219 Gastro-esophageal reflux disease without esophagitis: Secondary | ICD-10-CM | POA: Diagnosis present

## 2015-01-31 DIAGNOSIS — Z8249 Family history of ischemic heart disease and other diseases of the circulatory system: Secondary | ICD-10-CM

## 2015-01-31 DIAGNOSIS — O9962 Diseases of the digestive system complicating childbirth: Secondary | ICD-10-CM | POA: Diagnosis present

## 2015-01-31 DIAGNOSIS — Z3A40 40 weeks gestation of pregnancy: Secondary | ICD-10-CM

## 2015-01-31 DIAGNOSIS — O99824 Streptococcus B carrier state complicating childbirth: Secondary | ICD-10-CM | POA: Diagnosis present

## 2015-01-31 LAB — CBC
HCT: 36.7 % (ref 36.0–46.0)
HEMATOCRIT: 38 % (ref 36.0–46.0)
HEMOGLOBIN: 13 g/dL (ref 12.0–15.0)
Hemoglobin: 12.4 g/dL (ref 12.0–15.0)
MCH: 30.6 pg (ref 26.0–34.0)
MCH: 30.5 pg (ref 26.0–34.0)
MCHC: 34.2 g/dL (ref 30.0–36.0)
MCHC: 33.8 g/dL (ref 30.0–36.0)
MCV: 89.4 fL (ref 78.0–100.0)
MCV: 90.4 fL (ref 78.0–100.0)
PLATELETS: 136 10*3/uL — AB (ref 150–400)
Platelets: 135 10*3/uL — ABNORMAL LOW (ref 150–400)
RBC: 4.25 MIL/uL (ref 3.87–5.11)
RBC: 4.06 MIL/uL (ref 3.87–5.11)
RDW: 13.7 % (ref 11.5–15.5)
RDW: 13.6 % (ref 11.5–15.5)
WBC: 7.3 10*3/uL (ref 4.0–10.5)
WBC: 6.7 10*3/uL (ref 4.0–10.5)

## 2015-01-31 LAB — TYPE AND SCREEN
ABO/RH(D): O POS
ANTIBODY SCREEN: NEGATIVE

## 2015-01-31 MED ORDER — PRENATAL MULTIVITAMIN CH
1.0000 | ORAL_TABLET | Freq: Every day | ORAL | Status: DC
  Administered 2015-02-01: 1 via ORAL
  Filled 2015-01-31: qty 1

## 2015-01-31 MED ORDER — ONDANSETRON HCL 4 MG/2ML IJ SOLN: 4.0000 mg | INTRAMUSCULAR | Status: DC | PRN

## 2015-01-31 MED ORDER — DEXTROSE 5 % IV SOLN
5.0000 10*6.[IU] | Freq: Once | INTRAVENOUS | Status: AC
  Administered 2015-01-31: 5 10*6.[IU] via INTRAVENOUS
  Filled 2015-01-31: qty 5

## 2015-01-31 MED ORDER — TETANUS-DIPHTH-ACELL PERTUSSIS 5-2.5-18.5 LF-MCG/0.5 IM SUSP
0.5000 mL | Freq: Once | INTRAMUSCULAR | Status: AC
  Administered 2015-02-02: 0.5 mL via INTRAMUSCULAR
  Filled 2015-01-31: qty 0.5

## 2015-01-31 MED ORDER — ACETAMINOPHEN 325 MG PO TABS: 650.0000 mg | ORAL_TABLET | ORAL | Status: DC | PRN

## 2015-01-31 MED ORDER — METHYLERGONOVINE MALEATE 0.2 MG PO TABS: 0.2000 mg | ORAL_TABLET | ORAL | Status: DC | PRN

## 2015-01-31 MED ORDER — CLINDAMYCIN PHOSPHATE 900 MG/50ML IV SOLN
900.0000 mg | Freq: Three times a day (TID) | INTRAVENOUS | Status: DC
  Administered 2015-01-31: 900 mg via INTRAVENOUS
  Filled 2015-01-31 (×2): qty 50

## 2015-01-31 MED ORDER — DIPHENHYDRAMINE HCL 50 MG/ML IJ SOLN: 12.5000 mg | INTRAMUSCULAR | Status: DC | PRN

## 2015-01-31 MED ORDER — DIPHENHYDRAMINE HCL 25 MG PO CAPS: 25.0000 mg | ORAL_CAPSULE | Freq: Four times a day (QID) | ORAL | Status: DC | PRN

## 2015-01-31 MED ORDER — OXYCODONE-ACETAMINOPHEN 5-325 MG PO TABS: 1.0000 | ORAL_TABLET | ORAL | Status: DC | PRN

## 2015-01-31 MED ORDER — OXYCODONE-ACETAMINOPHEN 5-325 MG PO TABS: 2.0000 | ORAL_TABLET | ORAL | Status: DC | PRN

## 2015-01-31 MED ORDER — MEDROXYPROGESTERONE ACETATE 150 MG/ML IM SUSP: 150.0000 mg | INTRAMUSCULAR | Status: DC | PRN

## 2015-01-31 MED ORDER — ZOLPIDEM TARTRATE 5 MG PO TABS
5.0000 mg | ORAL_TABLET | Freq: Every evening | ORAL | Status: DC | PRN
Start: 2015-01-31 — End: 2015-02-02

## 2015-01-31 MED ORDER — OXYTOCIN 10 UNIT/ML IJ SOLN: 2.5000 [IU]/h | INTRAVENOUS | Status: DC | PRN

## 2015-01-31 MED ORDER — MISOPROSTOL 50MCG HALF TABLET
50.0000 ug | ORAL_TABLET | ORAL | Status: DC
  Administered 2015-01-31: 50 ug via ORAL
  Filled 2015-01-31: qty 0.5

## 2015-01-31 MED ORDER — OXYCODONE-ACETAMINOPHEN 5-325 MG PO TABS
1.0000 | ORAL_TABLET | ORAL | Status: DC | PRN
Start: 2015-01-31 — End: 2015-02-02

## 2015-01-31 MED ORDER — EPHEDRINE 5 MG/ML INJ: 10.0000 mg | INTRAVENOUS | Status: DC | PRN

## 2015-01-31 MED ORDER — FLEET ENEMA 7-19 GM/118ML RE ENEM: 1.0000 | ENEMA | Freq: Every day | RECTAL | Status: DC | PRN

## 2015-01-31 MED ORDER — FERROUS SULFATE 325 (65 FE) MG PO TABS
325.0000 mg | ORAL_TABLET | Freq: Two times a day (BID) | ORAL | Status: DC
  Administered 2015-02-01 – 2015-02-02 (×3): 325 mg via ORAL
  Filled 2015-01-31 (×3): qty 1

## 2015-01-31 MED ORDER — DIBUCAINE 1 % RE OINT: 1.0000 "application " | TOPICAL_OINTMENT | RECTAL | Status: DC | PRN

## 2015-01-31 MED ORDER — LACTATED RINGERS IV SOLN
INTRAVENOUS | Status: DC
  Administered 2015-01-31: 10:00:00 via INTRAVENOUS

## 2015-01-31 MED ORDER — LACTATED RINGERS IV SOLN
500.0000 mL | INTRAVENOUS | Status: DC | PRN
  Administered 2015-01-31: 1000 mL via INTRAVENOUS

## 2015-01-31 MED ORDER — SIMETHICONE 80 MG PO CHEW: 80.0000 mg | CHEWABLE_TABLET | ORAL | Status: DC | PRN

## 2015-01-31 MED ORDER — OXYTOCIN BOLUS FROM INFUSION
500.0000 mL | INTRAVENOUS | Status: DC
  Administered 2015-01-31: 500 mL via INTRAVENOUS

## 2015-01-31 MED ORDER — FENTANYL 2.5 MCG/ML BUPIVACAINE 1/10 % EPIDURAL INFUSION (WH - ANES)
14.0000 mL/h | INTRAMUSCULAR | Status: DC | PRN
  Administered 2015-01-31: 14 mL/h via EPIDURAL
  Filled 2015-01-31: qty 125

## 2015-01-31 MED ORDER — METHYLERGONOVINE MALEATE 0.2 MG/ML IJ SOLN: 0.2000 mg | INTRAMUSCULAR | Status: DC | PRN

## 2015-01-31 MED ORDER — FENTANYL CITRATE (PF) 100 MCG/2ML IJ SOLN: 100.0000 ug | INTRAMUSCULAR | Status: DC | PRN

## 2015-01-31 MED ORDER — SENNOSIDES-DOCUSATE SODIUM 8.6-50 MG PO TABS
2.0000 | ORAL_TABLET | ORAL | Status: DC
  Administered 2015-02-01 (×2): 2 via ORAL
  Filled 2015-01-31 (×2): qty 2

## 2015-01-31 MED ORDER — LANOLIN HYDROUS EX OINT: TOPICAL_OINTMENT | CUTANEOUS | Status: DC | PRN

## 2015-01-31 MED ORDER — PENICILLIN G POTASSIUM 5000000 UNITS IJ SOLR
2.5000 10*6.[IU] | INTRAVENOUS | Status: DC
  Filled 2015-01-31 (×4): qty 2.5

## 2015-01-31 MED ORDER — BISACODYL 10 MG RE SUPP
10.0000 mg | Freq: Every day | RECTAL | Status: DC | PRN
  Filled 2015-01-31: qty 1

## 2015-01-31 MED ORDER — ONDANSETRON HCL 4 MG/2ML IJ SOLN: 4.0000 mg | Freq: Four times a day (QID) | INTRAMUSCULAR | Status: DC | PRN

## 2015-01-31 MED ORDER — LACTATED RINGERS IV SOLN
2.5000 [IU]/h | INTRAVENOUS | Status: DC
  Administered 2015-01-31: 62.5 m[IU]/min via INTRAVENOUS
  Filled 2015-01-31: qty 4

## 2015-01-31 MED ORDER — LIDOCAINE HCL (PF) 1 % IJ SOLN
INTRAMUSCULAR | Status: DC | PRN
  Administered 2015-01-31 (×2): 8 mL via EPIDURAL

## 2015-01-31 MED ORDER — LIDOCAINE HCL (PF) 1 % IJ SOLN
30.0000 mL | INTRAMUSCULAR | Status: DC | PRN
  Filled 2015-01-31: qty 30

## 2015-01-31 MED ORDER — ONDANSETRON HCL 4 MG PO TABS: 4.0000 mg | ORAL_TABLET | ORAL | Status: DC | PRN

## 2015-01-31 MED ORDER — WITCH HAZEL-GLYCERIN EX PADS: 1.0000 "application " | MEDICATED_PAD | CUTANEOUS | Status: DC | PRN

## 2015-01-31 MED ORDER — CITRIC ACID-SODIUM CITRATE 334-500 MG/5ML PO SOLN: 30.0000 mL | ORAL | Status: DC | PRN

## 2015-01-31 MED ORDER — IBUPROFEN 600 MG PO TABS
600.0000 mg | ORAL_TABLET | Freq: Four times a day (QID) | ORAL | Status: DC
  Administered 2015-01-31 – 2015-02-02 (×7): 600 mg via ORAL
  Filled 2015-01-31 (×7): qty 1

## 2015-01-31 MED ORDER — PHENYLEPHRINE 40 MCG/ML (10ML) SYRINGE FOR IV PUSH (FOR BLOOD PRESSURE SUPPORT)
80.0000 ug | PREFILLED_SYRINGE | INTRAVENOUS | Status: DC | PRN
  Filled 2015-01-31: qty 20

## 2015-01-31 MED ORDER — MEASLES, MUMPS & RUBELLA VAC ~~LOC~~ INJ: 0.5000 mL | INJECTION | Freq: Once | SUBCUTANEOUS | Status: DC

## 2015-01-31 MED ORDER — BENZOCAINE-MENTHOL 20-0.5 % EX AERO: 1.0000 "application " | INHALATION_SPRAY | CUTANEOUS | Status: DC | PRN

## 2015-01-31 MED ORDER — CITRANATAL ASSURE 35-1 & 300 MG PO MISC: 1.0000 | Freq: Every day | ORAL | Status: DC

## 2015-01-31 NOTE — H&P (Signed)
Chelsea Wallace is a 25 y.o. female presenting for contractions with cervical dilation at term: latent labor. History OB History    Gravida Para Term Preterm AB TAB SAB Ectopic Multiple Living   0 0 0 0 0 0 1     Past Medical History  Diagnosis Date  . GERD (gastroesophageal reflux disease)     on prescription for it; pt cannot remember name of med.   Past Surgical History  Procedure Laterality Date  . Mandible reconstruction      lower jaw surgery 2011   Family History: family history includes Diabetes in her maternal grandfather and mother; Hypertension in her paternal grandfather and paternal grandmother. Social History:  reports that she has never smoked. She has never used smokeless tobacco. She reports that she does not drink alcohol or use illicit drugs.   Prenatal Transfer Tool  Maternal Diabetes: No Genetic Screening: Normal Maternal Ultrasounds/Referrals: Normal Fetal Ultrasounds or other Referrals:  None Maternal Substance Abuse:  No Significant Maternal Medications:  Meds include: Other: Azithromycin multiple times for chlamydia Significant Maternal Lab Results:  Lab values include: Group B Strep positive, Other: + Chlamydia Other Comments:  None  ROS  Dilation: 4.5 Effacement (%): 70 Station: -3 Exam by:: Ginger Morris RN Blood pressure 135/88, pulse 78, temperature 98 F (36.7 C), temperature source Oral, resp. rate 20, height  (1.651 m), weight 186 lb (84.369 kg), last menstrual period 03/15/2014, not currently breastfeeding. Exam Physical Exam  Prenatal labs: ABO, Rh: --/--/O POS (01/17 1015) Antibody: NEG (01/17 1015) Rubella: 7.16 (05/31 1629) RPR: NON REAC (10/19 1500)  HBsAg: NEGATIVE (05/31 1629)  HIV: NONREACTIVE (10/19 1500)  GBS: DETECTED (12/13 1610)   Assessment/Plan: Admit. Labor augmentation with Cytotec. Anticipate NSVD.  Epidural planning, IVP pain medications PRN.     Roe Coombs, CNM 01/31/2015, 12:37 PM

## 2015-01-31 NOTE — Anesthesia Procedure Notes (Signed)
Epidural Patient location during procedure: OB Start time: 01/31/2015 2:05 PM End time: 01/31/2015 2:13 PM  Staffing Anesthesiologist: Leilani Able Performed by: anesthesiologist   Preanesthetic Checklist Completed: patient identified, surgical consent, pre-op evaluation, timeout performed, IV checked, risks and benefits discussed and monitors and equipment checked  Epidural Patient position: sitting Prep: site prepped and draped and DuraPrep Patient monitoring: continuous pulse ox and blood pressure Approach: midline Location: L3-L4 Injection technique: LOR air  Needle:  Needle type: Tuohy  Needle gauge: 17 G Needle length: 9 cm and 9 Needle insertion depth: 5 cm cm Catheter type: closed end flexible Catheter size: 19 Gauge Catheter at skin depth: 10 cm Test dose: negative and Other  Assessment Sensory level: T9 Events: blood not aspirated, injection not painful, no injection resistance, negative IV test and no paresthesia  Additional Notes Reason for block:procedure for pain

## 2015-01-31 NOTE — Progress Notes (Signed)
Dr Clearance Coots notified of pts VE, contraction pattern, FHR pattern orders received

## 2015-01-31 NOTE — MAU Note (Signed)
PT presents to MAU with complaints of contractions that started at 5 this morning. Reports bloody show when she wipes. Denies LOF

## 2015-01-31 NOTE — Anesthesia Preprocedure Evaluation (Signed)
Anesthesia Evaluation  Patient identified by MRN, date of birth, ID band Patient awake    Reviewed: Allergy & Precautions, H&P , NPO status , Patient's Chart, lab work & pertinent test results  Airway Mallampati: I  TM Distance: >3 FB Neck ROM: full    Dental no notable dental hx.    Pulmonary neg pulmonary ROS,    Pulmonary exam normal        Cardiovascular negative cardio ROS Normal cardiovascular exam     Neuro/Psych negative neurological ROS  negative psych ROS   GI/Hepatic negative GI ROS, Neg liver ROS,   Endo/Other  negative endocrine ROS  Renal/GU negative Renal ROS     Musculoskeletal   Abdominal (+) + obese,   Peds  Hematology negative hematology ROS (+)   Anesthesia Other Findings   Reproductive/Obstetrics (+) Pregnancy                             Anesthesia Physical Anesthesia Plan  ASA: II  Anesthesia Plan: Epidural   Post-op Pain Management:    Induction:   Airway Management Planned:   Additional Equipment:   Intra-op Plan:   Post-operative Plan:   Informed Consent: I have reviewed the patients History and Physical, chart, labs and discussed the procedure including the risks, benefits and alternatives for the proposed anesthesia with the patient or authorized representative who has indicated his/her understanding and acceptance.     Plan Discussed with:   Anesthesia Plan Comments:         Anesthesia Quick Evaluation  

## 2015-02-01 ENCOUNTER — Other Ambulatory Visit: Payer: Self-pay | Admitting: Certified Nurse Midwife

## 2015-02-01 DIAGNOSIS — N390 Urinary tract infection, site not specified: Secondary | ICD-10-CM

## 2015-02-01 LAB — CBC
HCT: 34.7 % — ABNORMAL LOW (ref 36.0–46.0)
HEMOGLOBIN: 12 g/dL (ref 12.0–15.0)
MCH: 30.9 pg (ref 26.0–34.0)
MCHC: 34.6 g/dL (ref 30.0–36.0)
MCV: 89.4 fL (ref 78.0–100.0)
Platelets: 125 10*3/uL — ABNORMAL LOW (ref 150–400)
RBC: 3.88 MIL/uL (ref 3.87–5.11)
RDW: 13.6 % (ref 11.5–15.5)
WBC: 8.3 10*3/uL (ref 4.0–10.5)

## 2015-02-01 LAB — CCBB MATERNAL DONOR DRAW

## 2015-02-01 LAB — RPR: RPR Ser Ql: NONREACTIVE

## 2015-02-01 MED ORDER — CEPHALEXIN 500 MG PO CAPS: 500.0000 mg | ORAL_CAPSULE | Freq: Three times a day (TID) | ORAL | Status: DC

## 2015-02-01 MED ORDER — INFLUENZA VAC SPLIT QUAD 0.5 ML IM SUSY
0.5000 mL | PREFILLED_SYRINGE | INTRAMUSCULAR | Status: AC
  Administered 2015-02-02: 0.5 mL via INTRAMUSCULAR
  Filled 2015-02-01: qty 0.5

## 2015-02-01 NOTE — Lactation Note (Signed)
This note was copied from the chart of Chelsea Wallace. Lactation Consultation Note  Patient Name: Chelsea Lakita Sahlin ZOXWR'U Date: 02/01/2015 Reason for consult: Initial assessment  Baby is 20 hours old and has been to the breast several times, per mom the baby has been breast feeding well and does better at Night time. Reviewing doc flow sheets - breast fed x 6 7 -20 mins , Latch scores - 7-9-8- 5  and attempts. 3 voids , 5 stools. LC changed meconium stool ( medium size ), and then placed baby skin to skin, showed mom how to hand express, colostrum  container supplied with directions, few drops, mom applied to baby's lips. LC encouraged mom to hand express before latch and in between Feedings, save milk and staff ( RN or LC could show her how to spoon feed).  Attempt at latching x 2. Baby skin to skin on moms chest and baby back to sleep.  LC reviewed basics of latching. And encouraged mom to call with feeding cues.  Mother informed of post-discharge support and given phone number to the lactation department, including services for phone call  assistance; out-patient appointments; and breastfeeding support group. List of other breastfeeding resources in the community given in  the handout. Encouraged mother to call for problems or concerns related to breastfeeding.   Maternal Data Has patient been taught Hand Expression?: Yes Does the patient have breastfeeding experience prior to this delivery?: Yes  Feeding Feeding Type: Breast Fed Length of feed: 15 min  LATCH Score/Interventions Latch: Too sleepy or reluctant, no latch achieved, no sucking elicited. Intervention(s): Skin to skin;Teach feeding cues;Waking techniques Intervention(s): Adjust position;Assist with latch;Breast massage;Breast compression  Audible Swallowing: None  Type of Nipple: Everted at rest and after stimulation  Comfort (Breast/Nipple): Soft / non-tender     Hold (Positioning): Assistance needed to  correctly position infant at breast and maintain latch. Intervention(s): Breastfeeding basics reviewed;Support Pillows;Position options;Skin to skin  LATCH Score: 5  Lactation Tools Discussed/Used WIC Program: Yes   Consult Status Consult Status: Follow-up Date: 02/01/15 Follow-up type: In-patient    Kathrin Greathouse 02/01/2015, 2:12 PM

## 2015-02-01 NOTE — Progress Notes (Signed)
Patient is unsure if she wants tdap and influenza vaccines at this time.

## 2015-02-01 NOTE — Progress Notes (Signed)
Post Partum Day #1 Subjective: no complaints, up ad lib, voiding and tolerating PO. States breast feeding is going well.   Objective: Blood pressure 104/67, pulse 78, temperature 98 F (36.7 C), temperature source Oral, resp. rate 18, height  (1.651 m), weight 186 lb (84.369 kg), last menstrual period 03/15/2014, SpO2 99 %, unknown if currently breastfeeding.  Physical Exam:  General: alert, cooperative and no distress Lochia: appropriate Uterine Fundus: firm Incision: no significant drainage DVT Evaluation: No evidence of DVT seen on physical exam. No cords or calf tenderness. No significant calf/ankle edema.   Recent Labs  01/31/15 1906 02/01/15 0521  HGB 12.4 12.0  HCT 36.7 34.7*    Assessment/Plan: Plan for discharge tomorrow and Breastfeeding.    LOS: 1 day   Chelsea Wallace A Chelsea Wallace 02/01/2015, 8:32 AM

## 2015-02-01 NOTE — Anesthesia Postprocedure Evaluation (Signed)
Anesthesia Post Note  Patient: Chelsea Wallace  Procedure(s) Performed: * No procedures listed *  Patient location during evaluation: Mother Baby Anesthesia Type: Epidural Level of consciousness: awake and alert Pain management: pain level controlled Vital Signs Assessment: post-procedure vital signs reviewed and stable Respiratory status: spontaneous breathing Cardiovascular status: stable Postop Assessment: no backache, no headache, no signs of nausea or vomiting and adequate PO intake Anesthetic complications: no    Last Vitals:  Filed Vitals:   02/01/15 0529 02/01/15 0823  BP: 107/72 104/67  Pulse: 76 78  Temp: 36.8 C 36.7 C  Resp: 20 18    Last Pain:  Filed Vitals:   02/01/15 0829  PainSc: 4                  Annise Boran Hristova

## 2015-02-02 ENCOUNTER — Inpatient Hospital Stay (HOSPITAL_COMMUNITY): Admission: RE | Admit: 2015-02-02 | Payer: Medicaid Other | Source: Ambulatory Visit

## 2015-02-02 MED ORDER — IBUPROFEN 600 MG PO TABS: 600.0000 mg | ORAL_TABLET | Freq: Four times a day (QID) | ORAL | Status: DC

## 2015-02-02 MED ORDER — OXYCODONE-ACETAMINOPHEN 5-325 MG PO TABS: 2.0000 | ORAL_TABLET | ORAL | Status: DC | PRN

## 2015-02-02 NOTE — Progress Notes (Signed)
Post Partum Day #2 Subjective: no complaints, up ad lib, voiding and tolerating PO  Objective: Blood pressure 105/67, pulse 79, temperature 98 F (36.7 C), temperature source Oral, resp. rate 18, height  (1.651 m), weight 186 lb (84.369 kg), last menstrual period 03/15/2014, SpO2 99 %, unknown if currently breastfeeding.  Physical Exam:  General: alert, cooperative and no distress Lochia: appropriate Uterine Fundus: firm Incision: no significant drainage DVT Evaluation: No evidence of DVT seen on physical exam. No cords or calf tenderness. No significant calf/ankle edema.   Recent Labs  01/31/15 1906 02/01/15 0521  HGB 12.4 12.0  HCT 36.7 34.7*    Assessment/Plan: Discharge home and Breastfeeding   LOS: 2 days   Larue Lightner A Efton Thomley 02/02/2015, 8:44 AM

## 2015-02-02 NOTE — Lactation Note (Signed)
This note was copied from the chart of Chelsea Toneka Fullen. Lactation Consultation Note  Mother denies soreness or problems with breastfeeding. Reviewed engorgement care and monitoring voids/stools. Mother lost her #24 flanges so provided her w/ replacements.   Patient Name: Chelsea Wallace ZOXWR'U Date: 02/02/2015 Reason for consult: Follow-up assessment   Maternal Data    Feeding Feeding Type: Breast Fed Length of feed: 15 min  LATCH Score/Interventions Latch:  (instructed mother to call for next latch)                    Lactation Tools Discussed/Used     Consult Status Consult Status: Complete    Hardie Pulley 02/02/2015, 9:06 AM

## 2015-02-02 NOTE — Discharge Summary (Signed)
Obstetric Discharge Summary Reason for Admission: onset of labor Prenatal Procedures: ultrasound Intrapartum Procedures: spontaneous vaginal delivery Postpartum Procedures: none Complications-Operative and Postpartum: 2nd degree perineal laceration HEMOGLOBIN  Date Value Ref Range Status  02/01/2015 12.0 12.0 - 15.0 g/dL Final   HCT  Date Value Ref Range Status  02/01/2015 34.7* 36.0 - 46.0 % Final    Physical Exam:  General: alert, cooperative and no distress Lochia: appropriate Uterine Fundus: firm Incision: no significant drainage DVT Evaluation: No evidence of DVT seen on physical exam. No cords or calf tenderness. No significant calf/ankle edema.  Discharge Diagnoses: Term Pregnancy-delivered  Discharge Information: Date: 02/02/2015 Activity: pelvic rest Diet: routine Medications: PNV, Ibuprofen and Percocet Condition: stable Instructions: refer to practice specific booklet Discharge to: home   Newborn Data: Live born female  Birth Weight: 8 lb 8.2 oz (3860 g) APGAR: 8, 9  Home with mother.  Roe Coombs, CNM 02/02/2015, 8:46 AM

## 2015-02-14 ENCOUNTER — Ambulatory Visit (INDEPENDENT_AMBULATORY_CARE_PROVIDER_SITE_OTHER): Payer: Medicaid Other | Admitting: Certified Nurse Midwife

## 2015-02-14 VITALS — BP 114/81 | HR 72 | Wt 169.0 lb

## 2015-02-14 DIAGNOSIS — Z30011 Encounter for initial prescription of contraceptive pills: Secondary | ICD-10-CM

## 2015-02-14 MED ORDER — VITAFOL GUMMIES 3.33-0.333-34.8 MG PO CHEW: 3.0000 | CHEWABLE_TABLET | Freq: Every day | ORAL | Status: DC

## 2015-02-14 MED ORDER — NORETHINDRONE 0.35 MG PO TABS: 1.0000 | ORAL_TABLET | Freq: Every day | ORAL | Status: DC

## 2015-02-14 NOTE — Progress Notes (Signed)
[name], female   DOB: September 19, 1990, 25 y.o.   MRN: [id number]  Subjective:     Chelsea Wallace is a 25 y.o. female who presents for a postpartum visit. She is 2 weeks postpartum following a spontaneous vaginal delivery. I have fully reviewed the prenatal and intrapartum course. The delivery was at 39 gestational weeks. Outcome: spontaneous vaginal delivery. Anesthesia: epidural. Postpartum course has been normal. Baby's course has been normal. Baby is feeding by breast. Bleeding thin lochia, brown and red. Bowel function is normal. Bladder function is normal. Patient is not sexually active. Contraception method is abstinence. Postpartum depression screening: negative.  Tobacco, alcohol and substance abuse history reviewed.  Adult immunizations reviewed including TDAP, rubella and varicella.  The following portions of the patient's history were reviewed and updated as appropriate: allergies, current medications, past family history, past medical history, past social history, past surgical history and problem list.  Review of Systems Pertinent items noted in HPI and remainder of comprehensive ROS otherwise negative.   Objective:    BP 114/81 mmHg  Pulse 72  Wt 169 lb (76.658 kg)  General:  alert, cooperative and no distress   Breasts:  inspection negative, no nipple discharge or bleeding, no masses or nodularity palpable  Lungs: clear to auscultation bilaterally  Heart:  regular rate and rhythm, S1, S2 normal, no murmur, click, rub or gallop  Abdomen: soft, non-tender; bowel sounds normal; no masses,  no organomegaly   Vulva:  not evaluated  Vagina: not evaluated  Cervix:  not evaluated  Corpus: not examined  Adnexa:  not evaluated  Rectal Exam: Not performed.          50% of 15 min visit spent on counseling and coordination of care.  Assessment:     Normal 2 week postpartum exam. Pap smear not done at today's visit.  Plan:    1. Contraception: abstinence and oral  progesterone-only contraceptive  2. Follow up in: 4 weeks or as needed.  2hr GTT for h/o GDM/screening for DM q 3 yrs per ADA recommendations Preconception counseling provided Healthy lifestyle practices reviewed

## 2015-03-04 ENCOUNTER — Encounter (HOSPITAL_COMMUNITY): Payer: Self-pay | Admitting: *Deleted

## 2015-03-04 ENCOUNTER — Inpatient Hospital Stay (HOSPITAL_COMMUNITY)
Admission: AD | Admit: 2015-03-04 | Discharge: 2015-03-04 | Disposition: A | Discharge status: Home / Self Care | Payer: Medicaid Other | Source: Ambulatory Visit | Attending: Obstetrics | Admitting: Obstetrics

## 2015-03-04 DIAGNOSIS — N939 Abnormal uterine and vaginal bleeding, unspecified: Secondary | ICD-10-CM | POA: Diagnosis not present

## 2015-03-04 DIAGNOSIS — K219 Gastro-esophageal reflux disease without esophagitis: Secondary | ICD-10-CM | POA: Insufficient documentation

## 2015-03-04 LAB — CBC
HCT: 38.3 % (ref 36.0–46.0)
Hemoglobin: 12.9 g/dL (ref 12.0–15.0)
MCH: 30.6 pg (ref 26.0–34.0)
MCHC: 33.7 g/dL (ref 30.0–36.0)
MCV: 90.8 fL (ref 78.0–100.0)
Platelets: 151 10*3/uL (ref 150–400)
RBC: 4.22 MIL/uL (ref 3.87–5.11)
RDW: 13.2 % (ref 11.5–15.5)
WBC: 5.6 10*3/uL (ref 4.0–10.5)

## 2015-03-04 LAB — URINALYSIS, ROUTINE W REFLEX MICROSCOPIC
BILIRUBIN URINE: NEGATIVE
Glucose, UA: NEGATIVE mg/dL
KETONES UR: NEGATIVE mg/dL
Leukocytes, UA: NEGATIVE
NITRITE: NEGATIVE
PROTEIN: NEGATIVE mg/dL
Specific Gravity, Urine: 1.02 (ref 1.005–1.030)
pH: 6 (ref 5.0–8.0)

## 2015-03-04 LAB — URINE MICROSCOPIC-ADD ON
Bacteria, UA: NONE SEEN
WBC UA: NONE SEEN WBC/hpf (ref 0–5)

## 2015-03-04 LAB — POCT PREGNANCY, URINE: PREG TEST UR: NEGATIVE

## 2015-03-04 NOTE — MAU Provider Note (Signed)
History     CSN: [id number]  Arrival date and time: 03/04/15 1625   First Provider Initiated Contact with Patient 03/04/15 1703      Chief Complaint  Patient presents with  . Vaginal Bleeding   HPI  Chelsea Wallace is a 25 y.o. [id number]. She had SVD 01/31/15. She had OV1/13 and was started on POP for contraception. She presents with vaginal bleeding.  She had bleeding for 1 w after delivery, none x 1 d, then the bleeding started again. She c/o heavy, persistent bleeding. She changes a pad 2x/hr, had baseball sized clots yesterday. The bleeding is dark to pink, no cramping. She has noticed decreased milk supply since starting POP.  OB History    Gravida Para Term Preterm AB TAB SAB Ectopic Multiple Living   0 0 0 0 0 0 2      Past Medical History  Diagnosis Date  . GERD (gastroesophageal reflux disease)     on prescription for it; pt cannot remember name of med.    Past Surgical History  Procedure Laterality Date  . Mandible reconstruction      lower jaw surgery 2011    Family History  Problem Relation Age of Onset  . Diabetes Mother   . Diabetes Maternal Grandfather   . Hypertension Paternal Grandmother   . Hypertension Paternal Grandfather     Social History  Substance Use Topics  . Smoking status: Never Smoker   . Smokeless tobacco: Never Used  . Alcohol Use: No    Allergies: No Known Allergies  Prescriptions prior to admission  Medication Sig Dispense Refill Last Dose  . norethindrone (MICRONOR,CAMILA,ERRIN) 0.35 MG tablet Take 1 tablet (0.35 mg total) by mouth daily. 1 Package 11 03/03/2015 at Unknown time  . Pseudoephedrine-APAP-DM 30-325-15 MG/15ML LIQD Take 15 mLs by mouth daily as needed (cold symptoms).   03/04/2015 at Unknown time  . cephALEXin (KEFLEX) 500 MG capsule Take 1 capsule (500 mg total) by mouth 3 (three) times daily. (Patient not taking: Reported on 02/14/2015) 21 capsule 0 Completed Course at Unknown time  . ibuprofen (ADVIL,MOTRIN)  600 MG tablet Take 1 tablet (600 mg total) by mouth every 6 (six) hours. (Patient not taking: Reported on 02/14/2015) 90 tablet 2 Completed Course at Unknown time  . oxyCODONE-acetaminophen (PERCOCET/ROXICET) 5-325 MG tablet Take 2 tablets by mouth every 4 (four) hours as needed (pain scale > 7). (Patient not taking: Reported on 02/14/2015) 45 tablet 0 Completed Course at Unknown time  . Prenat w/o A-FeCbGl-DSS-FA-DHA (CITRANATAL ASSURE) 35-1 & 300 MG tablet Take 1 tablet by mouth daily. (Patient not taking: Reported on 02/14/2015) 30 tablet 12 Not Taking at Unknown time  . Prenatal Vit-Fe Phos-FA-Omega (VITAFOL GUMMIES) 3.33-0.333-34.8 MG CHEW Chew 3 tablets by mouth daily. (Patient not taking: Reported on 03/04/2015) 90 tablet 12 Not Taking at Unknown time    Review of Systems  Constitutional: Negative for fever and chills.  Gastrointestinal: Negative for abdominal pain.  Genitourinary: Negative for dysuria, urgency and frequency.       + vaginal bleeding   Physical Exam   Blood pressure 114/78, pulse 88, temperature 97.4 F (36.3 C), temperature source Oral, resp. rate 16, unknown if currently breastfeeding.  Physical Exam  Nursing note and vitals reviewed. Constitutional: She is oriented to person, place, and time. She appears well-developed and well-nourished.  GI: Soft. There is no tenderness.  Genitourinary:  Pelvic exam: Ext gen- nl anatomy, skin intact Vagina- small amt dark blood-  cleaned with 2 swabs Cx- closed Uterus-sl enlarged~8 wks size, non tender Adn- no masses palp  Musculoskeletal: Normal range of motion.  Neurological: She is alert and oriented to person, place, and time.  Skin: Skin is warm and dry.  Psychiatric: She has a normal mood and affect. Her behavior is normal.    MAU Course  Procedures  MDM Results for orders placed or performed during the hospital encounter of 03/04/15 (from the past 24 hour(s))  Urinalysis, Routine w reflex microscopic (not at [facility])      Status: Abnormal   Collection Time: 03/04/15  4:30 PM  Result Value Ref Range   Color, Urine YELLOW YELLOW   APPearance CLEAR CLEAR   Specific Gravity, Urine 1.020 1.005 - 1.030   pH 6.0 5.0 - 8.0   Glucose, UA NEGATIVE NEGATIVE mg/dL   Hgb urine dipstick LARGE (A) NEGATIVE   Bilirubin Urine NEGATIVE NEGATIVE   Ketones, ur NEGATIVE NEGATIVE mg/dL   Protein, ur NEGATIVE NEGATIVE mg/dL   Nitrite NEGATIVE NEGATIVE   Leukocytes, UA NEGATIVE NEGATIVE  Urine microscopic-add on     Status: Abnormal   Collection Time: 03/04/15  4:30 PM  Result Value Ref Range   Squamous Epithelial / LPF 0-5 (A) NONE SEEN   WBC, UA NONE SEEN 0 - 5 WBC/hpf   RBC / HPF TOO NUMEROUS TO COUNT 0 - 5 RBC/hpf   Bacteria, UA NONE SEEN NONE SEEN   Urine-Other MUCOUS PRESENT   Pregnancy, urine POC     Status: None   Collection Time: 03/04/15  4:42 PM  Result Value Ref Range   Preg Test, Ur NEGATIVE NEGATIVE  CBC     Status: None   Collection Time: 03/04/15  5:12 PM  Result Value Ref Range   WBC 5.6 4.0 - 10.5 K/uL   RBC 4.22 3.87 - 5.11 MIL/uL   Hemoglobin 12.9 12.0 - 15.0 g/dL   HCT 16.1 09.6 - 04.5 %   MCV 90.8 78.0 - 100.0 fL   MCH 30.6 26.0 - 34.0 pg   MCHC 33.7 30.0 - 36.0 g/dL   RDW 40.9 81.1 - 91.4 %   Platelets 151 150 - 400 K/uL     Assessment and Plan  Bleeding 4+ wks S/P SVD Hgb stable at 12.9 Small amt bleeding on exam Continue POP Call the office Monday about decreased milk supply   [place] Nine M. 03/04/2015, 5:23 PM

## 2015-03-04 NOTE — Discharge Instructions (Signed)
You can have vaginal bleeding for 6 wks after delivery. Call the office if you think it is getting too heavy.

## 2015-03-04 NOTE — MAU Note (Addendum)
Delivered vaginally on1-17-17 and has not stopped bleeding; bleeding lightened up and pt states that she had one day with no bleeding and then the bleeding started up again; very mild cramping with bleeding; had 3 large clots yesterday; is on BC pills;

## 2015-03-07 ENCOUNTER — Telehealth: Payer: Self-pay | Admitting: *Deleted

## 2015-03-07 NOTE — Telephone Encounter (Signed)
Patient states since she started the POP she has noticed a significant decrease in her milk supply. 2/21 11:35 Attempted to call patient- LM on VM

## 2015-03-14 ENCOUNTER — Ambulatory Visit: Payer: Medicaid Other | Admitting: Certified Nurse Midwife

## 2015-03-15 ENCOUNTER — Ambulatory Visit: Payer: Medicaid Other | Admitting: Certified Nurse Midwife

## 2015-03-21 ENCOUNTER — Ambulatory Visit (INDEPENDENT_AMBULATORY_CARE_PROVIDER_SITE_OTHER): Payer: Medicaid Other | Admitting: Certified Nurse Midwife

## 2015-03-21 ENCOUNTER — Encounter: Payer: Self-pay | Admitting: Certified Nurse Midwife

## 2015-03-21 VITALS — BP 126/85 | HR 90 | Temp 98.3°F | Wt 173.0 lb

## 2015-03-21 DIAGNOSIS — Z3041 Encounter for surveillance of contraceptive pills: Secondary | ICD-10-CM

## 2015-03-21 MED ORDER — NORETHIN-ETH ESTRAD-FE BIPHAS 1 MG-10 MCG / 10 MCG PO TABS: 1.0000 | ORAL_TABLET | Freq: Every day | ORAL | Status: DC

## 2015-03-21 NOTE — Patient Instructions (Signed)
Subjective:     Chelsea Wallace is a 25 y.o. female who presents for a postpartum visit. She is 6 weeks postpartum following a spontaneous vaginal delivery. I have fully reviewed the prenatal and intrapartum course. The delivery was at 39 gestational weeks. Outcome: spontaneous vaginal delivery. Anesthesia: epidural. Postpartum course has been normal. Baby's course has been normal. Baby is feeding by bottle - Similac Advance. Bleeding no bleeding. Bowel function is normal. Bladder function is normal. Patient is not sexually active. Contraception method is abstinence and oral progesterone-only contraceptive. Postpartum depression screening: negative.  Tobacco, alcohol and substance abuse history reviewed.  Adult immunizations reviewed including TDAP, rubella and varicella.  The following portions of the patient's history were reviewed and updated as appropriate: allergies, current medications, past family history, past medical history, past social history, past surgical history and problem list.  Review of Systems Pertinent items noted in HPI and remainder of comprehensive ROS otherwise negative.@SUBJECTIVEEND @  Objective:    BP 126/85 mmHg  Pulse 90  Temp(Src) 98.3 F (36.8 C)  Wt 173 lb (78.472 kg)  Breastfeeding? No  General:  alert, cooperative and no distress   Breasts:  inspection negative, no nipple discharge or bleeding, no masses or nodularity palpable  Lungs: clear to auscultation bilaterally  Heart:  regular rate and rhythm, S1, S2 normal, no murmur, click, rub or gallop  Abdomen: soft, non-tender; bowel sounds normal; no masses,  no organomegaly   Vulva:  normal  Vagina: normal vagina, no discharge, exudate, lesion, or erythema  Cervix:  no cervical motion tenderness  Corpus: normal size, contour, position, consistency, mobility, non-tender  Adnexa:  normal adnexa  Rectal Exam: Not performed.       50% of 30 min visit spent on counseling and coordination of care.   Assessment:     Normal 6 week postpartum exam. Pap smear not done at today's visit. Plan:    1. Contraception: OCP (estrogen/progesterone) 2.  F/U for pap smear in June 3. Follow up in: 3 months or as needed. 2hr GTT for h/o GDM/screening for DM q 3 yrs per ADA recommendations Preconception counseling provided Healthy lifestyle practices reviewed

## 2015-03-21 NOTE — Telephone Encounter (Signed)
Patient is in the office today 

## 2015-03-21 NOTE — Progress Notes (Signed)
[name], female   DOB: Apr 24, 1990, 25 y.o.   MRN: [id number]  Subjective:     Chelsea Wallace is a 25 y.o. female who presents for a postpartum visit. She is 6 weeks postpartum following a spontaneous vaginal delivery. I have fully reviewed the prenatal and intrapartum course. The delivery was at 39 gestational weeks. Outcome: spontaneous vaginal delivery. Anesthesia: epidural. Postpartum course has been normal. Baby's course has been normal. Baby is feeding by bottle - Similac Advance. Bleeding no bleeding. Bowel function is normal. Bladder function is normal. Patient is not sexually active. Contraception method is abstinence and oral progesterone-only contraceptive. Postpartum depression screening: negative.  Tobacco, alcohol and substance abuse history reviewed.  Adult immunizations reviewed including TDAP, rubella and varicella.  States that her breast milk supply decreased after starting the Micronor.  Has not been breast feeding for 2 weeks now.    The following portions of the patient's history were reviewed and updated as appropriate: allergies, current medications, past family history, past medical history, past social history, past surgical history and problem list.  Review of Systems Pertinent items noted in HPI and remainder of comprehensive ROS otherwise negative.   Objective:    BP 126/85 mmHg  Pulse 90  Temp(Src) 98.3 F (36.8 C)  Wt 173 lb (78.472 kg)  Breastfeeding? No  General:  alert, cooperative and no distress   Breasts:  inspection negative, no nipple discharge or bleeding, no masses or nodularity palpable  Lungs: clear to auscultation bilaterally  Heart:  regular rate and rhythm, S1, S2 normal, no murmur, click, rub or gallop  Abdomen: soft, non-tender; bowel sounds normal; no masses,  no organomegaly   Vulva:  normal  Vagina: normal vagina, no discharge, exudate, lesion, or erythema  Cervix:  no cervical motion tenderness  Corpus: normal size,  contour, position, consistency, mobility, non-tender  Adnexa:  normal adnexa  Rectal Exam: Not performed.          50% of 30 min visit spent on counseling and coordination of care.  Assessment:     Normal 6 week postpartum exam. Pap smear not done at today's visit.    Contraception management Plan:    1. Contraception:changed to OCP (estrogen/progesterone) 2. Follow up in: 4 months for annual exam or as needed.  2hr GTT for h/o GDM/screening for DM q 3 yrs per ADA recommendations Preconception counseling provided Healthy lifestyle practices reviewed

## 2015-03-24 ENCOUNTER — Other Ambulatory Visit: Payer: Self-pay | Admitting: Certified Nurse Midwife

## 2015-03-24 LAB — SURESWAB, VAGINOSIS/VAGINITIS PLUS
ATOPOBIUM VAGINAE: NOT DETECTED Log (cells/mL)
C. ALBICANS, DNA: NOT DETECTED
C. GLABRATA, DNA: NOT DETECTED
C. PARAPSILOSIS, DNA: NOT DETECTED
C. TRACHOMATIS RNA, TMA: DETECTED — AB
C. TROPICALIS, DNA: NOT DETECTED
Gardnerella vaginalis: 6.9 Log (cells/mL)
LACTOBACILLUS SPECIES: NOT DETECTED Log (cells/mL)
MEGASPHAERA SPECIES: NOT DETECTED Log (cells/mL)
N. GONORRHOEAE RNA, TMA: NOT DETECTED
T. VAGINALIS RNA, QL TMA: NOT DETECTED

## 2015-03-24 MED ORDER — IBUPROFEN 800 MG PO TABS: 800.0000 mg | ORAL_TABLET | Freq: Three times a day (TID) | ORAL | Status: DC | PRN

## 2015-03-28 ENCOUNTER — Other Ambulatory Visit: Payer: Self-pay | Admitting: Certified Nurse Midwife

## 2015-03-28 DIAGNOSIS — N760 Acute vaginitis: Secondary | ICD-10-CM

## 2015-03-28 DIAGNOSIS — A749 Chlamydial infection, unspecified: Secondary | ICD-10-CM

## 2015-03-28 DIAGNOSIS — B9689 Other specified bacterial agents as the cause of diseases classified elsewhere: Secondary | ICD-10-CM

## 2015-03-28 MED ORDER — METRONIDAZOLE 500 MG PO TABS: 500.0000 mg | ORAL_TABLET | Freq: Two times a day (BID) | ORAL | Status: DC

## 2015-03-28 MED ORDER — AZITHROMYCIN 250 MG PO TABS: ORAL_TABLET | ORAL | Status: DC

## 2015-04-18 ENCOUNTER — Encounter: Payer: Self-pay | Admitting: *Deleted

## 2015-06-16 ENCOUNTER — Ambulatory Visit: Payer: Medicaid Other | Admitting: Certified Nurse Midwife

## 2015-07-06 ENCOUNTER — Ambulatory Visit: Payer: Medicaid Other | Admitting: Certified Nurse Midwife

## 2016-01-15 NOTE — L&D Delivery Note (Signed)
Patient is a 26 y.o. now [id number] s/p NSVD at 735w2d, who was admitted for SVD.  Delivery Note At 4:26 PM a viable female was delivered via Vaginal, Spontaneous (Presentation: ;  ).  APGAR: 9, 9; weight  .   Placenta status:intact 3VC:  with the no complications. Cord pH: pending  Anesthesia:  none Episiotomy: None Lacerations: None requiring repair Suture Repair: n/a Est. Blood Loss (mL): 300  Head delivered direct OA. No nuchal cord present. Shoulder and body delivered in usual fashion. Infant with spontaneous cry, placed on mother's abdomen, dried and bulb suctioned. Cord clamped x 2 after 1-minute delay, and cut by Dr. Doroteo GlassmanPhelps. Cord blood drawn. Placenta delivered spontaneously with gentle cord traction. Fundus firm with massage and Pitocin. Perineum inspected and found to have no lacerations requriing repair.  Mom to postpartum.  Baby to Couplet care / Skin to Skin.  Marthenia RollingScott Jasie Meleski 12/11/2016, 4:48 PM

## 2016-02-09 ENCOUNTER — Other Ambulatory Visit: Payer: Self-pay | Admitting: *Deleted

## 2016-02-09 DIAGNOSIS — Z3041 Encounter for surveillance of contraceptive pills: Secondary | ICD-10-CM

## 2016-02-09 MED ORDER — NORETHIN-ETH ESTRAD-FE BIPHAS 1 MG-10 MCG / 10 MCG PO TABS: 1.0000 | ORAL_TABLET | Freq: Every day | ORAL | 2 refills | Status: DC

## 2016-02-09 NOTE — Progress Notes (Signed)
[facility], per protocol. Pharmacy noted that pt will need to call office for future refills, pt will need AEX.

## 2016-04-25 ENCOUNTER — Inpatient Hospital Stay (HOSPITAL_COMMUNITY)
Admission: AD | Admit: 2016-04-25 | Discharge: 2016-04-25 | Disposition: A | Discharge status: Home / Self Care | Payer: Medicaid Other | Source: Ambulatory Visit | Attending: Obstetrics and Gynecology | Admitting: Obstetrics and Gynecology

## 2016-04-25 ENCOUNTER — Inpatient Hospital Stay (HOSPITAL_COMMUNITY): Payer: Medicaid Other

## 2016-04-25 ENCOUNTER — Encounter (HOSPITAL_COMMUNITY): Payer: Self-pay

## 2016-04-25 DIAGNOSIS — R112 Nausea with vomiting, unspecified: Secondary | ICD-10-CM | POA: Diagnosis present

## 2016-04-25 DIAGNOSIS — O26891 Other specified pregnancy related conditions, first trimester: Secondary | ICD-10-CM | POA: Insufficient documentation

## 2016-04-25 DIAGNOSIS — O26899 Other specified pregnancy related conditions, unspecified trimester: Secondary | ICD-10-CM | POA: Diagnosis not present

## 2016-04-25 DIAGNOSIS — O3680X0 Pregnancy with inconclusive fetal viability, not applicable or unspecified: Secondary | ICD-10-CM

## 2016-04-25 DIAGNOSIS — Z3A01 Less than 8 weeks gestation of pregnancy: Secondary | ICD-10-CM | POA: Insufficient documentation

## 2016-04-25 DIAGNOSIS — O99611 Diseases of the digestive system complicating pregnancy, first trimester: Secondary | ICD-10-CM | POA: Diagnosis not present

## 2016-04-25 DIAGNOSIS — R109 Unspecified abdominal pain: Secondary | ICD-10-CM | POA: Diagnosis not present

## 2016-04-25 DIAGNOSIS — K219 Gastro-esophageal reflux disease without esophagitis: Secondary | ICD-10-CM | POA: Insufficient documentation

## 2016-04-25 LAB — URINALYSIS, ROUTINE W REFLEX MICROSCOPIC
Bilirubin Urine: NEGATIVE
GLUCOSE, UA: NEGATIVE mg/dL
HGB URINE DIPSTICK: NEGATIVE
Ketones, ur: NEGATIVE mg/dL
LEUKOCYTES UA: NEGATIVE
Nitrite: NEGATIVE
PH: 6 (ref 5.0–8.0)
PROTEIN: NEGATIVE mg/dL
Specific Gravity, Urine: 1.027 (ref 1.005–1.030)

## 2016-04-25 LAB — WET PREP, GENITAL
Sperm: NONE SEEN
TRICH WET PREP: NONE SEEN
Yeast Wet Prep HPF POC: NONE SEEN

## 2016-04-25 LAB — CBC
HCT: 36.1 % (ref 36.0–46.0)
HEMOGLOBIN: 12.3 g/dL (ref 12.0–15.0)
MCH: 31.3 pg (ref 26.0–34.0)
MCHC: 34.1 g/dL (ref 30.0–36.0)
MCV: 91.9 fL (ref 78.0–100.0)
Platelets: 183 10*3/uL (ref 150–400)
RBC: 3.93 MIL/uL (ref 3.87–5.11)
RDW: 13.4 % (ref 11.5–15.5)
WBC: 5.6 10*3/uL (ref 4.0–10.5)

## 2016-04-25 LAB — POCT PREGNANCY, URINE: Preg Test, Ur: POSITIVE — AB

## 2016-04-25 LAB — HCG, QUANTITATIVE, PREGNANCY: HCG, BETA CHAIN, QUANT, S: 26655 m[IU]/mL — AB (ref <5)

## 2016-04-25 LAB — HEPATITIS B SURFACE ANTIGEN: Hepatitis B Surface Ag: NEGATIVE

## 2016-04-25 MED ORDER — ACETAMINOPHEN 500 MG PO TABS
1000.0000 mg | ORAL_TABLET | Freq: Four times a day (QID) | ORAL | Status: DC | PRN
  Administered 2016-04-25: 1000 mg via ORAL
  Filled 2016-04-25: qty 2

## 2016-04-25 NOTE — MAU Note (Signed)
Delivered vaginally on 01/31/15; c/o lower abdominal cramping for past 4 days and back pain since last week; denies any vaginal bleeding or vaginal discharge;

## 2016-04-25 NOTE — MAU Note (Signed)
Pt is on BC pills but doesn't remember the name of it;

## 2016-04-25 NOTE — MAU Provider Note (Signed)
History     CSN: [id number]  Arrival date and time: 04/25/16 1447   First Provider Initiated Contact with Patient 04/25/16 1533      Chief Complaint  Patient presents with  . Abdominal Pain  . Nausea  . Emesis   [id number]  by LMP here with lower abdominal cramping. Sx started about 4 days ago. She used Tylenol, Ibuprofen, heat pad, and cold pack with no relief. Rates pain 8/10. She's been taking OCPs consistently since last year and has not missed any pills. She also reports more VB about 2 weeks after last menses was finished. She did not take a HPT.    OB History    Gravida Para Term Preterm AB Living   0 0 2   SAB TAB Ectopic Multiple Live Births   0 0 0 0 2      Past Medical History:  Diagnosis Date  . GERD (gastroesophageal reflux disease)    on prescription for it; pt cannot remember name of med.    Past Surgical History:  Procedure Laterality Date  . MANDIBLE RECONSTRUCTION     lower jaw surgery 2011    Family History  Problem Relation Age of Onset  . Diabetes Mother   . Diabetes Maternal Grandfather   . Hypertension Paternal Grandmother   . Hypertension Paternal Grandfather     Social History  Substance Use Topics  . Smoking status: Never Smoker  . Smokeless tobacco: Never Used  . Alcohol use No    Allergies: No Known Allergies  Prescriptions Prior to Admission  Medication Sig Dispense Refill Last Dose  . azithromycin (ZITHROMAX) 250 MG tablet Take 4 tablets all together now. 4 tablet 0   . ibuprofen (ADVIL,MOTRIN) 800 MG tablet Take 1 tablet (800 mg total) by mouth every 8 (eight) hours as needed. 60 tablet 1   . metroNIDAZOLE (FLAGYL) 500 MG tablet Take 1 tablet (500 mg total) by mouth 2 (two) times daily. 14 tablet 0   . norethindrone (MICRONOR,CAMILA,ERRIN) 0.35 MG tablet Take 1 tablet (0.35 mg total) by mouth daily. 1 Package 11 Taking  . Norethindrone-Ethinyl Estradiol-Fe Biphas (LO LOESTRIN FE) 1 MG-10 MCG / 10 MCG tablet Take 1  tablet by mouth daily. 1 Package 2   . Prenat w/o [id number] (CITRANATAL ASSURE) 35-1 & 300 MG tablet Take 1 tablet by mouth daily. (Patient not taking: Reported on 02/14/2015) 30 tablet 12 Not Taking    Review of Systems  Constitutional: Negative for fever.  Gastrointestinal: Positive for abdominal pain, nausea (daily) and vomiting (occasionally).  Genitourinary: Negative for dysuria, vaginal bleeding and vaginal discharge.   Physical Exam   Blood pressure 121/62, pulse 90, temperature 98.4 F (36.9 C), temperature source Oral, resp. rate 16, height  (1.651 m), weight 74.8 kg (165 lb), last menstrual period 03/18/2016, not currently breastfeeding.  Physical Exam  Nursing note and vitals reviewed. Constitutional: She is oriented to person, place, and time. She appears well-developed and well-nourished. No distress.  HENT:  Head: Normocephalic and atraumatic.  Neck: Normal range of motion.  Cardiovascular: Normal rate.   Respiratory: Effort normal. No respiratory distress.  GI: Soft. She exhibits no distension and no mass. There is no tenderness. There is no rebound and no guarding.  Genitourinary:  Genitourinary Comments: External: no lesions or erythema Vagina: rugated, parous, thin yellow discharge Uterus: non enlarged, anteverted, non tender, no CMT Adnexae: no masses, no tenderness left, no tenderness right   Musculoskeletal: Normal range of motion.  Neurological: She is alert and oriented to person, place, and time.  Skin: Skin is warm and dry.  Psychiatric: She has a normal mood and affect.   Results for orders placed or performed during the hospital encounter of 04/25/16 (from the past 24 hour(s))  Urinalysis, Routine w reflex microscopic     Status: None   Collection Time: 04/25/16  2:58 PM  Result Value Ref Range   Color, Urine YELLOW YELLOW   APPearance CLEAR CLEAR   Specific Gravity, Urine 1.027 1.005 - 1.030   pH 6.0 5.0 - 8.0   Glucose, UA NEGATIVE  NEGATIVE mg/dL   Hgb urine dipstick NEGATIVE NEGATIVE   Bilirubin Urine NEGATIVE NEGATIVE   Ketones, ur NEGATIVE NEGATIVE mg/dL   Protein, ur NEGATIVE NEGATIVE mg/dL   Nitrite NEGATIVE NEGATIVE   Leukocytes, UA NEGATIVE NEGATIVE  Pregnancy, urine POC     Status: Abnormal   Collection Time: 04/25/16  3:10 PM  Result Value Ref Range   Preg Test, Ur POSITIVE (A) NEGATIVE  CBC     Status: None   Collection Time: 04/25/16  3:45 PM  Result Value Ref Range   WBC 5.6 4.0 - 10.5 K/uL   RBC 3.93 3.87 - 5.11 MIL/uL   Hemoglobin 12.3 12.0 - 15.0 g/dL   HCT 13.0 86.5 - 78.4 %   MCV 91.9 78.0 - 100.0 fL   MCH 31.3 26.0 - 34.0 pg   MCHC 34.1 30.0 - 36.0 g/dL   RDW 69.6 29.5 - 28.4 %   Platelets 183 150 - 400 K/uL  hCG, quantitative, pregnancy     Status: Abnormal   Collection Time: 04/25/16  3:46 PM  Result Value Ref Range   hCG, Beta Chain, Quant, S 26,655 (H) <5 mIU/mL  Wet prep, genital     Status: Abnormal   Collection Time: 04/25/16  3:50 PM  Result Value Ref Range   Yeast Wet Prep HPF POC NONE SEEN NONE SEEN   Trich, Wet Prep NONE SEEN NONE SEEN   Clue Cells Wet Prep HPF POC PRESENT (A) NONE SEEN   WBC, Wet Prep HPF POC MODERATE (A) NONE SEEN   Sperm NONE SEEN    US Ob Comp Less 14 Wks  Result Date: 04/25/2016 CLINICAL DATA:  Abdominal cramping and back pain for 4 days, no bleeding EXAM: OBSTETRIC <14 WK [place] AND TRANSVAGINAL OB US TECHNIQUE: Both transabdominal and transvaginal ultrasound examinations were performed for complete evaluation of the gestation as well as the maternal uterus, adnexal regions, and pelvic cul-de-sac. Transvaginal technique was performed to assess early pregnancy. COMPARISON:  None FINDINGS: Intrauterine gestational sac: Present Yolk sac:  Present Embryo:  Not identified Cardiac Activity: N/A Heart Rate: N/A  bpm MSD: 13.1  mm   6 w   1  d Subchorionic hemorrhage:  None visualized. Maternal uterus/adnexae: Uterus retroverted. No additional focal uterine  abnormalities. LEFT ovary normal size and morphology, 2.9 x 2.2 x 1.4 cm. RIGHT ovary normal size and morphology 2.5 x 3.0 x 2.2 cm. No free pelvic fluid or adnexal masses. IMPRESSION: Gestational sac containing a yolk sac within uterus as above. No fetal pole identified to establish viability. Electronically Signed   By: Ulyses Southward M.D.   On: 04/25/2016 17:10   US Ob Transvaginal  Result Date: 04/25/2016 CLINICAL DATA:  Abdominal cramping and back pain for 4 days, no bleeding EXAM: OBSTETRIC <14 WK [place] AND TRANSVAGINAL OB US TECHNIQUE: Both transabdominal and transvaginal ultrasound examinations were performed for complete evaluation  of the gestation as well as the maternal uterus, adnexal regions, and pelvic cul-de-sac. Transvaginal technique was performed to assess early pregnancy. COMPARISON:  None FINDINGS: Intrauterine gestational sac: Present Yolk sac:  Present Embryo:  Not identified Cardiac Activity: N/A Heart Rate: N/A  bpm MSD: 13.1  mm   6 w   1  d Subchorionic hemorrhage:  None visualized. Maternal uterus/adnexae: Uterus retroverted. No additional focal uterine abnormalities. LEFT ovary normal size and morphology, 2.9 x 2.2 x 1.4 cm. RIGHT ovary normal size and morphology 2.5 x 3.0 x 2.2 cm. No free pelvic fluid or adnexal masses. IMPRESSION: Gestational sac containing a yolk sac within uterus as above. No fetal pole identified to establish viability. Electronically Signed   By: Ulyses Southward M.D.   On: 04/25/2016 17:10   MAU Course  Procedures Tylenol 1g po  MDM Labs and [place] ordered and reviewed. Early IUP seen on [place], no FP-recommend f/u for fetal viability. No evidence of UTI or pelvic infection. Pain likely physiologic to early pregnancy. Pt was quite teary when I told her of the pregnancy. Support given. She is stable for discharge home.  Assessment and Plan   1. [redacted] weeks gestation of pregnancy   2. Abdominal cramping affecting pregnancy   3. Pregnancy with inconclusive fetal viability,  single or unspecified fetus   4. Pregnancy with inconclusive fetal viability    Discharge home Follow up in [facility] after [place] in 7-10 days SAB precautions Tylenol and heat prn   Allergies as of 04/25/2016   No Known Allergies     Medication List    STOP taking these medications   azithromycin 250 MG tablet Commonly known as:  ZITHROMAX   ibuprofen 800 MG tablet Commonly known as:  ADVIL,MOTRIN   metroNIDAZOLE 500 MG tablet Commonly known as:  FLAGYL   norethindrone 0.35 MG tablet Commonly known as:  MICRONOR,CAMILA,ERRIN   Norethindrone-Ethinyl Estradiol-Fe Biphas 1 MG-10 MCG / 10 MCG tablet Commonly known as:  LO LOESTRIN FE     TAKE these medications   CITRANATAL ASSURE 35-1 & 300 MG tablet Take 1 tablet by mouth daily.      Donette Larry, CNM 04/25/2016, 3:51 PM

## 2016-04-25 NOTE — MAU Note (Signed)
Pt having lower abdominal pain and back pain. 8/10  Having nausea and vomiting.

## 2016-04-26 LAB — HIV ANTIBODY (ROUTINE TESTING W REFLEX): HIV SCREEN 4TH GENERATION: NONREACTIVE

## 2016-04-26 LAB — GC/CHLAMYDIA PROBE AMP (~~LOC~~) NOT AT ARMC
CHLAMYDIA, DNA PROBE: NEGATIVE
NEISSERIA GONORRHEA: NEGATIVE

## 2016-04-26 LAB — RPR: RPR: NONREACTIVE

## 2016-05-02 ENCOUNTER — Ambulatory Visit (HOSPITAL_COMMUNITY)
Admission: RE | Admit: 2016-05-02 | Discharge: 2016-05-02 | Disposition: A | Discharge status: Home / Self Care | Payer: Medicaid Other | Source: Ambulatory Visit | Attending: Certified Nurse Midwife | Admitting: Certified Nurse Midwife

## 2016-05-02 ENCOUNTER — Ambulatory Visit: Payer: Medicaid Other

## 2016-05-02 ENCOUNTER — Encounter: Payer: Self-pay | Admitting: Family Medicine

## 2016-05-02 DIAGNOSIS — O3680X0 Pregnancy with inconclusive fetal viability, not applicable or unspecified: Secondary | ICD-10-CM | POA: Diagnosis not present

## 2016-05-02 DIAGNOSIS — O209 Hemorrhage in early pregnancy, unspecified: Secondary | ICD-10-CM | POA: Insufficient documentation

## 2016-05-02 DIAGNOSIS — Z3A01 Less than 8 weeks gestation of pregnancy: Secondary | ICD-10-CM | POA: Insufficient documentation

## 2016-05-02 DIAGNOSIS — Z712 Person consulting for explanation of examination or test findings: Secondary | ICD-10-CM

## 2016-05-02 NOTE — Progress Notes (Signed)
Patient came down for ultrasound for her results. Results were reviewed with provider and clinical staff. Patient has viable IUP at 6 weeks and 5 days. Patient was advised to start prenatal vitamins and always call her ob of choice before taking any new  medications.  Patient voice understanding.

## 2016-05-03 ENCOUNTER — Ambulatory Visit: Payer: Medicaid Other

## 2016-05-15 ENCOUNTER — Inpatient Hospital Stay (HOSPITAL_COMMUNITY)
Admission: AD | Admit: 2016-05-15 | Discharge: 2016-05-15 | Disposition: A | Discharge status: Home / Self Care | Payer: Medicaid Other | Source: Ambulatory Visit | Attending: Obstetrics & Gynecology | Admitting: Obstetrics & Gynecology

## 2016-05-15 ENCOUNTER — Other Ambulatory Visit: Payer: Self-pay

## 2016-05-15 ENCOUNTER — Encounter (HOSPITAL_COMMUNITY): Payer: Self-pay | Admitting: *Deleted

## 2016-05-15 DIAGNOSIS — Z79899 Other long term (current) drug therapy: Secondary | ICD-10-CM | POA: Insufficient documentation

## 2016-05-15 DIAGNOSIS — O99611 Diseases of the digestive system complicating pregnancy, first trimester: Secondary | ICD-10-CM | POA: Insufficient documentation

## 2016-05-15 DIAGNOSIS — O219 Vomiting of pregnancy, unspecified: Secondary | ICD-10-CM | POA: Diagnosis present

## 2016-05-15 DIAGNOSIS — R079 Chest pain, unspecified: Secondary | ICD-10-CM | POA: Diagnosis not present

## 2016-05-15 DIAGNOSIS — R102 Pelvic and perineal pain: Secondary | ICD-10-CM

## 2016-05-15 DIAGNOSIS — Z8249 Family history of ischemic heart disease and other diseases of the circulatory system: Secondary | ICD-10-CM | POA: Insufficient documentation

## 2016-05-15 DIAGNOSIS — O26891 Other specified pregnancy related conditions, first trimester: Secondary | ICD-10-CM | POA: Insufficient documentation

## 2016-05-15 DIAGNOSIS — Z3490 Encounter for supervision of normal pregnancy, unspecified, unspecified trimester: Secondary | ICD-10-CM

## 2016-05-15 DIAGNOSIS — Z3A08 8 weeks gestation of pregnancy: Secondary | ICD-10-CM | POA: Diagnosis present

## 2016-05-15 DIAGNOSIS — R05 Cough: Secondary | ICD-10-CM | POA: Diagnosis not present

## 2016-05-15 DIAGNOSIS — Z9889 Other specified postprocedural states: Secondary | ICD-10-CM | POA: Insufficient documentation

## 2016-05-15 DIAGNOSIS — Z833 Family history of diabetes mellitus: Secondary | ICD-10-CM | POA: Diagnosis not present

## 2016-05-15 LAB — BASIC METABOLIC PANEL
ANION GAP: 7 (ref 5–15)
BUN: 12 mg/dL (ref 6–20)
CHLORIDE: 106 mmol/L (ref 101–111)
CO2: 23 mmol/L (ref 22–32)
Calcium: 9.3 mg/dL (ref 8.9–10.3)
Creatinine, Ser: 0.48 mg/dL (ref 0.44–1.00)
Glucose, Bld: 97 mg/dL (ref 65–99)
POTASSIUM: 3.9 mmol/L (ref 3.5–5.1)
SODIUM: 136 mmol/L (ref 135–145)

## 2016-05-15 LAB — CBC
HCT: 33.8 % — ABNORMAL LOW (ref 36.0–46.0)
Hemoglobin: 11.4 g/dL — ABNORMAL LOW (ref 12.0–15.0)
MCH: 31.1 pg (ref 26.0–34.0)
MCHC: 33.7 g/dL (ref 30.0–36.0)
MCV: 92.3 fL (ref 78.0–100.0)
PLATELETS: 158 10*3/uL (ref 150–400)
RBC: 3.66 MIL/uL — AB (ref 3.87–5.11)
RDW: 13.5 % (ref 11.5–15.5)
WBC: 6.8 10*3/uL (ref 4.0–10.5)

## 2016-05-15 LAB — URINALYSIS, ROUTINE W REFLEX MICROSCOPIC
BILIRUBIN URINE: NEGATIVE
Glucose, UA: NEGATIVE mg/dL
HGB URINE DIPSTICK: NEGATIVE
KETONES UR: NEGATIVE mg/dL
Leukocytes, UA: NEGATIVE
NITRITE: NEGATIVE
Protein, ur: NEGATIVE mg/dL
SPECIFIC GRAVITY, URINE: 1.03 (ref 1.005–1.030)
pH: 6 (ref 5.0–8.0)

## 2016-05-15 LAB — WET PREP, GENITAL
SPERM: NONE SEEN
Trich, Wet Prep: NONE SEEN
Yeast Wet Prep HPF POC: NONE SEEN

## 2016-05-15 LAB — MAGNESIUM: MAGNESIUM: 2.1 mg/dL (ref 1.7–2.4)

## 2016-05-15 LAB — TSH: TSH: 0.047 u[IU]/mL — AB (ref 0.350–4.500)

## 2016-05-15 MED ORDER — LACTATED RINGERS IV BOLUS (SEPSIS)
1000.0000 mL | Freq: Once | INTRAVENOUS | Status: AC
  Administered 2016-05-15: 1000 mL via INTRAVENOUS

## 2016-05-15 MED ORDER — ACETAMINOPHEN 500 MG PO TABS
1000.0000 mg | ORAL_TABLET | Freq: Once | ORAL | Status: AC
  Administered 2016-05-15: 1000 mg via ORAL
  Filled 2016-05-15: qty 2

## 2016-05-15 NOTE — MAU Note (Signed)
Chest is hurting really, really bad.  Over the weekend, her heart would start racing, it would make her feel SOB, she would cough and now it hurts.  Chest pain, comes and goes.  Having cramping in lower abd like she is on her period.

## 2016-05-15 NOTE — Discharge Instructions (Signed)
First Trimester of Pregnancy The first trimester of pregnancy is from week 1 until the end of week 13 (months 1 through 3). A week after a sperm fertilizes an egg, the egg will implant on the wall of the uterus. This embryo will begin to develop into a baby. Genes from you and your partner will form the baby. The female genes will determine whether the baby will be a boy or a girl. At 6-8 weeks, the eyes and face will be formed, and the heartbeat can be seen on ultrasound. At the end of 12 weeks, all the baby's organs will be formed. Now that you are pregnant, you will want to do everything you can to have a healthy baby. Two of the most important things are to get good prenatal care and to follow your health care provider's instructions. Prenatal care is all the medical care you receive before the baby's birth. This care will help prevent, find, and treat any problems during the pregnancy and childbirth. Body changes during your first trimester Your body goes through many changes during pregnancy. The changes vary from woman to woman.  You may gain or lose a couple of pounds at first.  You may feel sick to your stomach (nauseous) and you may throw up (vomit). If the vomiting is uncontrollable, call your health care provider.  You may tire easily.  You may develop headaches that can be relieved by medicines. All medicines should be approved by your health care provider.  You may urinate more often. Painful urination may mean you have a bladder infection.  You may develop heartburn as a result of your pregnancy.  You may develop constipation because certain hormones are causing the muscles that push stool through your intestines to slow down.  You may develop hemorrhoids or swollen veins (varicose veins).  Your breasts may begin to grow larger and become tender. Your nipples may stick out more, and the tissue that surrounds them (areola) may become darker.  Your gums may bleed and may be  sensitive to brushing and flossing.  Dark spots or blotches (chloasma, mask of pregnancy) may develop on your face. This will likely fade after the baby is born.  Your menstrual periods will stop.  You may have a loss of appetite.  You may develop cravings for certain kinds of food.  You may have changes in your emotions from day to day, such as being excited to be pregnant or being concerned that something may go wrong with the pregnancy and baby.  You may have more vivid and strange dreams.  You may have changes in your hair. These can include thickening of your hair, rapid growth, and changes in texture. Some women also have hair loss during or after pregnancy, or hair that feels dry or thin. Your hair will most likely return to normal after your baby is born.  What to expect at prenatal visits During a routine prenatal visit:  You will be weighed to make sure you and the baby are growing normally.  Your blood pressure will be taken.  Your abdomen will be measured to track your baby's growth.  The fetal heartbeat will be listened to between weeks 10 and 14 of your pregnancy.  Test results from any previous visits will be discussed.  Your health care provider may ask you:  How you are feeling.  If you are feeling the baby move.  If you have had any abnormal symptoms, such as leaking fluid, bleeding, severe headaches,   or abdominal cramping.  If you are using any tobacco products, including cigarettes, chewing tobacco, and electronic cigarettes.  If you have any questions.  Other tests that may be performed during your first trimester include:  Blood tests to find your blood type and to check for the presence of any previous infections. The tests will also be used to check for low iron levels (anemia) and protein on red blood cells (Rh antibodies). Depending on your risk factors, or if you previously had diabetes during pregnancy, you may have tests to check for high blood  sugar that affects pregnant women (gestational diabetes).  Urine tests to check for infections, diabetes, or protein in the urine.  An ultrasound to confirm the proper growth and development of the baby.  Fetal screens for spinal cord problems (spina bifida) and Down syndrome.  HIV (human immunodeficiency virus) testing. Routine prenatal testing includes screening for HIV, unless you choose not to have this test.  You may need other tests to make sure you and the baby are doing well.  Follow these instructions at home: Medicines  Follow your health care provider's instructions regarding medicine use. Specific medicines may be either safe or unsafe to take during pregnancy.  Take a prenatal vitamin that contains at least 600 micrograms (mcg) of folic acid.  If you develop constipation, try taking a stool softener if your health care provider approves. Eating and drinking  Eat a balanced diet that includes fresh fruits and vegetables, whole grains, good sources of protein such as meat, eggs, or tofu, and low-fat dairy. Your health care provider will help you determine the amount of weight gain that is right for you.  Avoid raw meat and uncooked cheese. These carry germs that can cause birth defects in the baby.  Eating four or five small meals rather than three large meals a day may help relieve nausea and vomiting. If you start to feel nauseous, eating a few soda crackers can be helpful. Drinking liquids between meals, instead of during meals, also seems to help ease nausea and vomiting.  Limit foods that are high in fat and processed sugars, such as fried and sweet foods.  To prevent constipation: ? Eat foods that are high in fiber, such as fresh fruits and vegetables, whole grains, and beans. ? Drink enough fluid to keep your urine clear or pale yellow. Activity  Exercise only as directed by your health care provider. Most women can continue their usual exercise routine during  pregnancy. Try to exercise for 30 minutes at least 5 days a week. Exercising will help you: ? Control your weight. ? Stay in shape. ? Be prepared for labor and delivery.  Experiencing pain or cramping in the lower abdomen or lower back is a good sign that you should stop exercising. Check with your health care provider before continuing with normal exercises.  Try to avoid standing for long periods of time. Move your legs often if you must stand in one place for a long time.  Avoid heavy lifting.  Wear low-heeled shoes and practice good posture.  You may continue to have sex unless your health care provider tells you not to. Relieving pain and discomfort  Wear a good support bra to relieve breast tenderness.  Take warm sitz baths to soothe any pain or discomfort caused by hemorrhoids. Use hemorrhoid cream if your health care provider approves.  Rest with your legs elevated if you have leg cramps or low back pain.  If you develop   varicose veins in your legs, wear support hose. Elevate your feet for 15 minutes, 3-4 times a day. Limit salt in your diet. Prenatal care  Schedule your prenatal visits by the twelfth week of pregnancy. They are usually scheduled monthly at first, then more often in the last 2 months before delivery.  Write down your questions. Take them to your prenatal visits.  Keep all your prenatal visits as told by your health care provider. This is important. Safety  Wear your seat belt at all times when driving.  Make a list of emergency phone numbers, including numbers for family, friends, the hospital, and police and fire departments. General instructions  Ask your health care provider for a referral to a local prenatal education class. Begin classes no later than the beginning of month 6 of your pregnancy.  Ask for help if you have counseling or nutritional needs during pregnancy. Your health care provider can offer advice or refer you to specialists for help  with various needs.  Do not use hot tubs, steam rooms, or saunas.  Do not douche or use tampons or scented sanitary pads.  Do not cross your legs for long periods of time.  Avoid cat litter boxes and soil used by cats. These carry germs that can cause birth defects in the baby and possibly loss of the fetus by miscarriage or stillbirth.  Avoid all smoking, herbs, alcohol, and medicines not prescribed by your health care provider. Chemicals in these products affect the formation and growth of the baby.  Do not use any products that contain nicotine or tobacco, such as cigarettes and e-cigarettes. If you need help quitting, ask your health care provider. You may receive counseling support and other resources to help you quit.  Schedule a dentist appointment. At home, brush your teeth with a soft toothbrush and be gentle when you floss. Contact a health care provider if:  You have dizziness.  You have mild pelvic cramps, pelvic pressure, or nagging pain in the abdominal area.  You have persistent nausea, vomiting, or diarrhea.  You have a bad smelling vaginal discharge.  You have pain when you urinate.  You notice increased swelling in your face, hands, legs, or ankles.  You are exposed to fifth disease or chickenpox.  You are exposed to German measles (rubella) and have never had it. Get help right away if:  You have a fever.  You are leaking fluid from your vagina.  You have spotting or bleeding from your vagina.  You have severe abdominal cramping or pain.  You have rapid weight gain or loss.  You vomit blood or material that looks like coffee grounds.  You develop a severe headache.  You have shortness of breath.  You have any kind of trauma, such as from a fall or a car accident. Summary  The first trimester of pregnancy is from week 1 until the end of week 13 (months 1 through 3).  Your body goes through many changes during pregnancy. The changes vary from  woman to woman.  You will have routine prenatal visits. During those visits, your health care provider will examine you, discuss any test results you may have, and talk with you about how you are feeling. This information is not intended to replace advice given to you by your health care provider. Make sure you discuss any questions you have with your health care provider. Document Released: 12/25/2000 Document Revised: 12/13/2015 Document Reviewed: 12/13/2015 [employer] Interactive Patient Education  2017 [employer]  

## 2016-05-15 NOTE — MAU Provider Note (Signed)
History     CSN: [id number]  Arrival date and time: 05/15/16 4098   First Provider Initiated Contact with Patient 05/15/16 1915      Chief Complaint  Patient presents with  . Chest Pain  . Abdominal Pain   Ms. Chelsea Wallace is a 26 yo G3P2002 at 8.[redacted] wks gestation by LMP presenting with complaints of lower abdominal pain and chest pressure since this past weekend.  She reports that the pressure in her chest occurs intermittently while sitting, lying, moving or resting.  She states the pressure is minimally relieved with Tylenol and drinking room temp H2O. Denies h/o asthma or any other cardiopulmonary issues. She reports some N/V, but not unusual for her pregnancy. She plans to start [facility] in about 5-6 wks, because she is training at a new job.  Chest Pain   This is a recurrent problem. The current episode started in the past 7 days. The onset quality is sudden. The problem occurs intermittently. The problem has been unchanged. The pain is present in the substernal region. The pain is mild. The quality of the pain is described as pressure. The pain does not radiate. Associated symptoms include abdominal pain (lower ) and shortness of breath. The pain is aggravated by coughing, movement, walking and exertion. She has tried acetaminophen and rest for the symptoms. The treatment provided no relief. There are no known risk factors.     Past Medical History:  Diagnosis Date  . GERD (gastroesophageal reflux disease)    on prescription for it; pt cannot remember name of med.    Past Surgical History:  Procedure Laterality Date  . MANDIBLE RECONSTRUCTION     lower jaw surgery 2011    Family History  Problem Relation Age of Onset  . Diabetes Mother   . Diabetes Maternal Grandfather   . Hypertension Paternal Grandmother   . Hypertension Paternal Grandfather     Social History  Substance Use Topics  . Smoking status: Never Smoker  . Smokeless tobacco: Never Used  . Alcohol  use No    Allergies: No Known Allergies  Prescriptions Prior to Admission  Medication Sig Dispense Refill Last Dose  . acetaminophen (TYLENOL) 500 MG tablet Take 1,000 mg by mouth every 6 (six) hours as needed for moderate pain.   05/15/2016 at Unknown time  . Prenat w/o [id number] (CITRANATAL ASSURE) 35-1 & 300 MG tablet Take 1 tablet by mouth daily. (Patient not taking: Reported on 02/14/2015) 30 tablet 12 Not Taking    Review of Systems  Constitutional: Negative.   HENT: Negative.   Eyes: Negative.   Respiratory: Positive for shortness of breath.   Cardiovascular: Positive for chest pain (with coughing).  Gastrointestinal: Positive for abdominal pain (lower ).  Endocrine: Negative.   Genitourinary: Negative for vaginal bleeding and vaginal discharge.  Musculoskeletal: Negative.   Allergic/Immunologic: Negative.   Neurological: Negative.   Hematological: Negative.   Psychiatric/Behavioral: Negative.    Physical Exam   Blood pressure 121/68, pulse 88, temperature 98.3 F (36.8 C), temperature source Oral, resp. rate 19, weight 78.2 kg (172 lb 8 oz), last menstrual period 03/18/2016, SpO2 100 %, not currently breastfeeding.  Physical Exam  Constitutional: She is oriented to person, place, and time. She appears well-developed and well-nourished.  HENT:  Head: Normocephalic and atraumatic.  Eyes: Pupils are equal, round, and reactive to light.  Neck: Normal range of motion.  Cardiovascular: Intact distal pulses.   After completion of EKG, pulse ox registered HR at  180>177>175>117>96 (decreased with deep breaths)  Respiratory: Effort normal and breath sounds normal.  GI: Soft. Bowel sounds are normal.  Genitourinary:  Genitourinary Comments: Scant white d/c, wet prep, GC/CT done, cx: closed/long/firm/posterior  Musculoskeletal: Normal range of motion.  Neurological: She is alert and oriented to person, place, and time. She has normal reflexes.  Skin: Skin is warm and  dry.  Psychiatric: She has a normal mood and affect. Her behavior is normal. Judgment and thought content normal.   Results for orders placed or performed during the hospital encounter of 05/15/16 (from the past 24 hour(s))  Urinalysis, Routine w reflex microscopic     Status: None   Collection Time: 05/15/16  6:50 PM  Result Value Ref Range   Color, Urine YELLOW YELLOW   APPearance CLEAR CLEAR   Specific Gravity, Urine 1.030 1.005 - 1.030   pH 6.0 5.0 - 8.0   Glucose, UA NEGATIVE NEGATIVE mg/dL   Hgb urine dipstick NEGATIVE NEGATIVE   Bilirubin Urine NEGATIVE NEGATIVE   Ketones, ur NEGATIVE NEGATIVE mg/dL   Protein, ur NEGATIVE NEGATIVE mg/dL   Nitrite NEGATIVE NEGATIVE   Leukocytes, UA NEGATIVE NEGATIVE  CBC     Status: Abnormal   Collection Time: 05/15/16  7:45 PM  Result Value Ref Range   WBC 6.8 4.0 - 10.5 K/uL   RBC 3.66 (L) 3.87 - 5.11 MIL/uL   Hemoglobin 11.4 (L) 12.0 - 15.0 g/dL   HCT 16.1 (L) 09.6 - 04.5 %   MCV 92.3 78.0 - 100.0 fL   MCH 31.1 26.0 - 34.0 pg   MCHC 33.7 30.0 - 36.0 g/dL   RDW 40.9 81.1 - 91.4 %   Platelets 158 150 - 400 K/uL  Basic metabolic panel     Status: None   Collection Time: 05/15/16  7:45 PM  Result Value Ref Range   Sodium 136 135 - 145 mmol/L   Potassium 3.9 3.5 - 5.1 mmol/L   Chloride 106 101 - 111 mmol/L   CO2 23 22 - 32 mmol/L   Glucose, Bld 97 65 - 99 mg/dL   BUN 12 6 - 20 mg/dL   Creatinine, Ser 7.82 0.44 - 1.00 mg/dL   Calcium 9.3 8.9 - 95.6 mg/dL   GFR calc non Af Amer >60 >60 mL/min   GFR calc Af Amer >60 >60 mL/min   Anion gap 7 5 - 15  Magnesium     Status: None   Collection Time: 05/15/16  7:45 PM  Result Value Ref Range   Magnesium 2.1 1.7 - 2.4 mg/dL  TSH     Status: Abnormal   Collection Time: 05/15/16  7:45 PM  Result Value Ref Range   TSH 0.047 (L) 0.350 - 4.500 uIU/mL  Wet prep, genital     Status: Abnormal   Collection Time: 05/15/16  7:55 PM  Result Value Ref Range   Yeast Wet Prep HPF POC NONE SEEN NONE  SEEN   Trich, Wet Prep NONE SEEN NONE SEEN   Clue Cells Wet Prep HPF POC PRESENT (A) NONE SEEN   WBC, Wet Prep HPF POC MANY (A) NONE SEEN   Sperm NONE SEEN     MAU Course  Procedures  MDM CBC  BMP Magnesium level TSH IVFs: LR bolus 1000 ml Continuous cardiac monitoring Consult with Dr. Orpah Clinton at [facility] re: pt's complaints and orders  - recommend ambulation after 1 hr on monitor, if ok after ambulation -  F/U with cardiology outpatient; if increased HR repeats with  ambulation, transfer to [facility]  Patient has had 1L of fluids, labs normal. No increase in HR with ambulation. Will DC home with outpatient referral to cardiology.   Assessment and Plan  Report given and care assumed by Chelsea Wallace, CNM at 2018  1. Chest pain, unspecified type   2. Pelvic pain in pregnancy, antepartum, first trimester   3. Intrauterine pregnancy    DC home Comfort measures reviewed  1st Trimester precautions  RX: none  Return to MAU as needed FU with OB as planned  Follow-up Information    [facility] Follow up.   Why:  [phone] Contact information: [address] [place] [address]       [facility] Follow up.   Specialty:  Obstetrics and Gynecology Contact information: [address], [address] [place] [address] [phone]           Chelsea Mora MSN, CNM 05/15/2016, 7:38 PM

## 2016-05-17 LAB — GC/CHLAMYDIA PROBE AMP (~~LOC~~) NOT AT ARMC
Chlamydia: NEGATIVE
Neisseria Gonorrhea: NEGATIVE

## 2016-05-25 ENCOUNTER — Emergency Department (HOSPITAL_COMMUNITY): Payer: Medicaid Other

## 2016-05-25 ENCOUNTER — Emergency Department (HOSPITAL_COMMUNITY)
Admission: EM | Admit: 2016-05-25 | Discharge: 2016-05-25 | Disposition: A | Discharge status: Home / Self Care | Payer: Medicaid Other | Attending: Emergency Medicine | Admitting: Emergency Medicine

## 2016-05-25 ENCOUNTER — Encounter (HOSPITAL_COMMUNITY): Payer: Self-pay

## 2016-05-25 DIAGNOSIS — Y999 Unspecified external cause status: Secondary | ICD-10-CM | POA: Insufficient documentation

## 2016-05-25 DIAGNOSIS — Y939 Activity, unspecified: Secondary | ICD-10-CM | POA: Insufficient documentation

## 2016-05-25 DIAGNOSIS — S8001XA Contusion of right knee, initial encounter: Secondary | ICD-10-CM | POA: Diagnosis not present

## 2016-05-25 DIAGNOSIS — Y92410 Unspecified street and highway as the place of occurrence of the external cause: Secondary | ICD-10-CM | POA: Insufficient documentation

## 2016-05-25 DIAGNOSIS — S8991XA Unspecified injury of right lower leg, initial encounter: Secondary | ICD-10-CM | POA: Diagnosis present

## 2016-05-25 MED ORDER — ACETAMINOPHEN 325 MG PO TABS
650.0000 mg | ORAL_TABLET | Freq: Once | ORAL | Status: AC
  Administered 2016-05-25: 650 mg via ORAL
  Filled 2016-05-25: qty 2

## 2016-05-25 NOTE — ED Notes (Signed)
Pt. Returned from xray 

## 2016-05-25 NOTE — ED Notes (Signed)
Patient transported to X-ray 

## 2016-05-25 NOTE — Discharge Instructions (Signed)
Take Tylenol as needed for pain, apply ice to help with the swelling, expect to be stiff and sore over the next week.  Return to emergency room if worsening abdominal pain, vaginal bleeding or other concerns.

## 2016-05-25 NOTE — ED Notes (Signed)
ED Provider at bedside. 
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
## 2016-05-27 ENCOUNTER — Encounter (HOSPITAL_COMMUNITY): Payer: Self-pay | Admitting: *Deleted

## 2016-05-27 ENCOUNTER — Inpatient Hospital Stay (HOSPITAL_COMMUNITY)
Admission: AD | Admit: 2016-05-27 | Discharge: 2016-05-27 | Disposition: A | Discharge status: Home / Self Care | Payer: Medicaid Other | Source: Ambulatory Visit | Attending: Obstetrics and Gynecology | Admitting: Obstetrics and Gynecology

## 2016-05-27 DIAGNOSIS — R1011 Right upper quadrant pain: Secondary | ICD-10-CM | POA: Insufficient documentation

## 2016-05-27 DIAGNOSIS — O26899 Other specified pregnancy related conditions, unspecified trimester: Secondary | ICD-10-CM

## 2016-05-27 DIAGNOSIS — Z3A10 10 weeks gestation of pregnancy: Secondary | ICD-10-CM | POA: Insufficient documentation

## 2016-05-27 DIAGNOSIS — R1032 Left lower quadrant pain: Secondary | ICD-10-CM | POA: Insufficient documentation

## 2016-05-27 DIAGNOSIS — R109 Unspecified abdominal pain: Secondary | ICD-10-CM | POA: Diagnosis not present

## 2016-05-27 DIAGNOSIS — O26891 Other specified pregnancy related conditions, first trimester: Secondary | ICD-10-CM | POA: Insufficient documentation

## 2016-05-27 DIAGNOSIS — F419 Anxiety disorder, unspecified: Secondary | ICD-10-CM

## 2016-05-27 LAB — URINALYSIS, MICROSCOPIC (REFLEX): RBC / HPF: NONE SEEN RBC/hpf (ref 0–5)

## 2016-05-27 LAB — URINALYSIS, ROUTINE W REFLEX MICROSCOPIC
BILIRUBIN URINE: NEGATIVE
Glucose, UA: NEGATIVE mg/dL
HGB URINE DIPSTICK: NEGATIVE
KETONES UR: NEGATIVE mg/dL
Nitrite: NEGATIVE
Protein, ur: NEGATIVE mg/dL
Specific Gravity, Urine: 1.02 (ref 1.005–1.030)
pH: 7 (ref 5.0–8.0)

## 2016-05-27 MED ORDER — CYCLOBENZAPRINE HCL 5 MG PO TABS
5.0000 mg | ORAL_TABLET | Freq: Once | ORAL | Status: AC
  Administered 2016-05-27: 5 mg via ORAL
  Filled 2016-05-27: qty 1

## 2016-05-27 MED ORDER — CYCLOBENZAPRINE HCL 10 MG PO TABS: 10.0000 mg | ORAL_TABLET | Freq: Three times a day (TID) | ORAL | 0 refills | Status: AC | PRN

## 2016-05-27 MED ORDER — CYCLOBENZAPRINE HCL 5 MG PO TABS: 5.0000 mg | ORAL_TABLET | Freq: Every day | ORAL | Status: DC

## 2016-05-27 MED ORDER — HYDROXYZINE PAMOATE 25 MG PO CAPS: 25.0000 mg | ORAL_CAPSULE | Freq: Every evening | ORAL | 0 refills | Status: DC | PRN

## 2016-05-27 NOTE — Discharge Instructions (Signed)
First Trimester of Pregnancy The first trimester of pregnancy is from week 1 until the end of week 13 (months 1 through 3). A week after a sperm fertilizes an egg, the egg will implant on the wall of the uterus. This embryo will begin to develop into a baby. Genes from you and your partner will form the baby. The female genes will determine whether the baby will be a boy or a girl. At 6-8 weeks, the eyes and face will be formed, and the heartbeat can be seen on ultrasound. At the end of 12 weeks, all the baby's organs will be formed. Now that you are pregnant, you will want to do everything you can to have a healthy baby. Two of the most important things are to get good prenatal care and to follow your health care provider's instructions. Prenatal care is all the medical care you receive before the baby's birth. This care will help prevent, find, and treat any problems during the pregnancy and childbirth. Body changes during your first trimester Your body goes through many changes during pregnancy. The changes vary from woman to woman.  You may gain or lose a couple of pounds at first.  You may feel sick to your stomach (nauseous) and you may throw up (vomit). If the vomiting is uncontrollable, call your health care provider.  You may tire easily.  You may develop headaches that can be relieved by medicines. All medicines should be approved by your health care provider.  You may urinate more often. Painful urination may mean you have a bladder infection.  You may develop heartburn as a result of your pregnancy.  You may develop constipation because certain hormones are causing the muscles that push stool through your intestines to slow down.  You may develop hemorrhoids or swollen veins (varicose veins).  Your breasts may begin to grow larger and become tender. Your nipples may stick out more, and the tissue that surrounds them (areola) may become darker.  Your gums may bleed and may be  sensitive to brushing and flossing.  Dark spots or blotches (chloasma, mask of pregnancy) may develop on your face. This will likely fade after the baby is born.  Your menstrual periods will stop.  You may have a loss of appetite.  You may develop cravings for certain kinds of food.  You may have changes in your emotions from day to day, such as being excited to be pregnant or being concerned that something may go wrong with the pregnancy and baby.  You may have more vivid and strange dreams.  You may have changes in your hair. These can include thickening of your hair, rapid growth, and changes in texture. Some women also have hair loss during or after pregnancy, or hair that feels dry or thin. Your hair will most likely return to normal after your baby is born.  What to expect at prenatal visits During a routine prenatal visit:  You will be weighed to make sure you and the baby are growing normally.  Your blood pressure will be taken.  Your abdomen will be measured to track your baby's growth.  The fetal heartbeat will be listened to between weeks 10 and 14 of your pregnancy.  Test results from any previous visits will be discussed.  Your health care provider may ask you:  How you are feeling.  If you are feeling the baby move.  If you have had any abnormal symptoms, such as leaking fluid, bleeding, severe headaches,   or abdominal cramping.  If you are using any tobacco products, including cigarettes, chewing tobacco, and electronic cigarettes.  If you have any questions.  Other tests that may be performed during your first trimester include:  Blood tests to find your blood type and to check for the presence of any previous infections. The tests will also be used to check for low iron levels (anemia) and protein on red blood cells (Rh antibodies). Depending on your risk factors, or if you previously had diabetes during pregnancy, you may have tests to check for high blood  sugar that affects pregnant women (gestational diabetes).  Urine tests to check for infections, diabetes, or protein in the urine.  An ultrasound to confirm the proper growth and development of the baby.  Fetal screens for spinal cord problems (spina bifida) and Down syndrome.  HIV (human immunodeficiency virus) testing. Routine prenatal testing includes screening for HIV, unless you choose not to have this test.  You may need other tests to make sure you and the baby are doing well.  Follow these instructions at home: Medicines  Follow your health care provider's instructions regarding medicine use. Specific medicines may be either safe or unsafe to take during pregnancy.  Take a prenatal vitamin that contains at least 600 micrograms (mcg) of folic acid.  If you develop constipation, try taking a stool softener if your health care provider approves. Eating and drinking  Eat a balanced diet that includes fresh fruits and vegetables, whole grains, good sources of protein such as meat, eggs, or tofu, and low-fat dairy. Your health care provider will help you determine the amount of weight gain that is right for you.  Avoid raw meat and uncooked cheese. These carry germs that can cause birth defects in the baby.  Eating four or five small meals rather than three large meals a day may help relieve nausea and vomiting. If you start to feel nauseous, eating a few soda crackers can be helpful. Drinking liquids between meals, instead of during meals, also seems to help ease nausea and vomiting.  Limit foods that are high in fat and processed sugars, such as fried and sweet foods.  To prevent constipation: ? Eat foods that are high in fiber, such as fresh fruits and vegetables, whole grains, and beans. ? Drink enough fluid to keep your urine clear or pale yellow. Activity  Exercise only as directed by your health care provider. Most women can continue their usual exercise routine during  pregnancy. Try to exercise for 30 minutes at least 5 days a week. Exercising will help you: ? Control your weight. ? Stay in shape. ? Be prepared for labor and delivery.  Experiencing pain or cramping in the lower abdomen or lower back is a good sign that you should stop exercising. Check with your health care provider before continuing with normal exercises.  Try to avoid standing for long periods of time. Move your legs often if you must stand in one place for a long time.  Avoid heavy lifting.  Wear low-heeled shoes and practice good posture.  You may continue to have sex unless your health care provider tells you not to. Relieving pain and discomfort  Wear a good support bra to relieve breast tenderness.  Take warm sitz baths to soothe any pain or discomfort caused by hemorrhoids. Use hemorrhoid cream if your health care provider approves.  Rest with your legs elevated if you have leg cramps or low back pain.  If you develop   varicose veins in your legs, wear support hose. Elevate your feet for 15 minutes, 3-4 times a day. Limit salt in your diet. Prenatal care  Schedule your prenatal visits by the twelfth week of pregnancy. They are usually scheduled monthly at first, then more often in the last 2 months before delivery.  Write down your questions. Take them to your prenatal visits.  Keep all your prenatal visits as told by your health care provider. This is important. Safety  Wear your seat belt at all times when driving.  Make a list of emergency phone numbers, including numbers for family, friends, the hospital, and police and fire departments. General instructions  Ask your health care provider for a referral to a local prenatal education class. Begin classes no later than the beginning of month 6 of your pregnancy.  Ask for help if you have counseling or nutritional needs during pregnancy. Your health care provider can offer advice or refer you to specialists for help  with various needs.  Do not use hot tubs, steam rooms, or saunas.  Do not douche or use tampons or scented sanitary pads.  Do not cross your legs for long periods of time.  Avoid cat litter boxes and soil used by cats. These carry germs that can cause birth defects in the baby and possibly loss of the fetus by miscarriage or stillbirth.  Avoid all smoking, herbs, alcohol, and medicines not prescribed by your health care provider. Chemicals in these products affect the formation and growth of the baby.  Do not use any products that contain nicotine or tobacco, such as cigarettes and e-cigarettes. If you need help quitting, ask your health care provider. You may receive counseling support and other resources to help you quit.  Schedule a dentist appointment. At home, brush your teeth with a soft toothbrush and be gentle when you floss. Contact a health care provider if:  You have dizziness.  You have mild pelvic cramps, pelvic pressure, or nagging pain in the abdominal area.  You have persistent nausea, vomiting, or diarrhea.  You have a bad smelling vaginal discharge.  You have pain when you urinate.  You notice increased swelling in your face, hands, legs, or ankles.  You are exposed to fifth disease or chickenpox.  You are exposed to German measles (rubella) and have never had it. Get help right away if:  You have a fever.  You are leaking fluid from your vagina.  You have spotting or bleeding from your vagina.  You have severe abdominal cramping or pain.  You have rapid weight gain or loss.  You vomit blood or material that looks like coffee grounds.  You develop a severe headache.  You have shortness of breath.  You have any kind of trauma, such as from a fall or a car accident. Summary  The first trimester of pregnancy is from week 1 until the end of week 13 (months 1 through 3).  Your body goes through many changes during pregnancy. The changes vary from  woman to woman.  You will have routine prenatal visits. During those visits, your health care provider will examine you, discuss any test results you may have, and talk with you about how you are feeling. This information is not intended to replace advice given to you by your health care provider. Make sure you discuss any questions you have with your health care provider. Document Released: 12/25/2000 Document Revised: 12/13/2015 Document Reviewed: 12/13/2015 [employer] Interactive Patient Education  2017 [employer]  
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
## 2016-06-03 DIAGNOSIS — Z3490 Encounter for supervision of normal pregnancy, unspecified, unspecified trimester: Secondary | ICD-10-CM | POA: Insufficient documentation

## 2016-06-04 ENCOUNTER — Encounter: Payer: Medicaid Other | Admitting: Certified Nurse Midwife

## 2016-06-06 ENCOUNTER — Encounter: Payer: Self-pay | Admitting: *Deleted

## 2016-06-11 ENCOUNTER — Inpatient Hospital Stay (HOSPITAL_COMMUNITY)
Admission: AD | Admit: 2016-06-11 | Discharge: 2016-06-11 | Disposition: A | Discharge status: Home / Self Care | Payer: Medicaid Other | Source: Ambulatory Visit | Attending: Family Medicine | Admitting: Family Medicine

## 2016-06-11 ENCOUNTER — Encounter (HOSPITAL_COMMUNITY): Payer: Self-pay

## 2016-06-11 DIAGNOSIS — O23591 Infection of other part of genital tract in pregnancy, first trimester: Secondary | ICD-10-CM | POA: Insufficient documentation

## 2016-06-11 DIAGNOSIS — Z3A12 12 weeks gestation of pregnancy: Secondary | ICD-10-CM | POA: Insufficient documentation

## 2016-06-11 DIAGNOSIS — Z8249 Family history of ischemic heart disease and other diseases of the circulatory system: Secondary | ICD-10-CM | POA: Insufficient documentation

## 2016-06-11 DIAGNOSIS — O26892 Other specified pregnancy related conditions, second trimester: Secondary | ICD-10-CM | POA: Diagnosis not present

## 2016-06-11 DIAGNOSIS — Z9889 Other specified postprocedural states: Secondary | ICD-10-CM | POA: Diagnosis not present

## 2016-06-11 DIAGNOSIS — N760 Acute vaginitis: Secondary | ICD-10-CM | POA: Insufficient documentation

## 2016-06-11 DIAGNOSIS — B9689 Other specified bacterial agents as the cause of diseases classified elsewhere: Secondary | ICD-10-CM | POA: Diagnosis not present

## 2016-06-11 DIAGNOSIS — R109 Unspecified abdominal pain: Secondary | ICD-10-CM | POA: Diagnosis not present

## 2016-06-11 DIAGNOSIS — O26891 Other specified pregnancy related conditions, first trimester: Secondary | ICD-10-CM | POA: Insufficient documentation

## 2016-06-11 DIAGNOSIS — N898 Other specified noninflammatory disorders of vagina: Secondary | ICD-10-CM | POA: Diagnosis present

## 2016-06-11 DIAGNOSIS — O99611 Diseases of the digestive system complicating pregnancy, first trimester: Secondary | ICD-10-CM | POA: Insufficient documentation

## 2016-06-11 DIAGNOSIS — K219 Gastro-esophageal reflux disease without esophagitis: Secondary | ICD-10-CM | POA: Insufficient documentation

## 2016-06-11 DIAGNOSIS — Z833 Family history of diabetes mellitus: Secondary | ICD-10-CM | POA: Insufficient documentation

## 2016-06-11 DIAGNOSIS — O26899 Other specified pregnancy related conditions, unspecified trimester: Secondary | ICD-10-CM

## 2016-06-11 LAB — URINALYSIS, ROUTINE W REFLEX MICROSCOPIC
Bilirubin Urine: NEGATIVE
GLUCOSE, UA: NEGATIVE mg/dL
HGB URINE DIPSTICK: NEGATIVE
KETONES UR: NEGATIVE mg/dL
NITRITE: NEGATIVE
PROTEIN: NEGATIVE mg/dL
Specific Gravity, Urine: 1.029 (ref 1.005–1.030)
pH: 6 (ref 5.0–8.0)

## 2016-06-11 LAB — WET PREP, GENITAL
Sperm: NONE SEEN
Trich, Wet Prep: NONE SEEN
YEAST WET PREP: NONE SEEN

## 2016-06-11 MED ORDER — METRONIDAZOLE 500 MG PO TABS: 500.0000 mg | ORAL_TABLET | Freq: Two times a day (BID) | ORAL | 0 refills | Status: AC
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
## 2016-06-24 ENCOUNTER — Encounter: Payer: Self-pay | Admitting: *Deleted

## 2016-06-24 ENCOUNTER — Ambulatory Visit (INDEPENDENT_AMBULATORY_CARE_PROVIDER_SITE_OTHER): Payer: Medicaid Other | Admitting: Certified Nurse Midwife

## 2016-06-24 ENCOUNTER — Encounter: Payer: Self-pay | Admitting: Certified Nurse Midwife

## 2016-06-24 ENCOUNTER — Other Ambulatory Visit (HOSPITAL_COMMUNITY)
Admission: RE | Admit: 2016-06-24 | Discharge: 2016-06-24 | Disposition: A | Discharge status: Home / Self Care | Payer: Medicaid Other | Source: Ambulatory Visit | Attending: Certified Nurse Midwife | Admitting: Certified Nurse Midwife

## 2016-06-24 VITALS — BP 124/82 | HR 91 | Wt 169.9 lb

## 2016-06-24 DIAGNOSIS — Z3482 Encounter for supervision of other normal pregnancy, second trimester: Secondary | ICD-10-CM

## 2016-06-24 DIAGNOSIS — O0932 Supervision of pregnancy with insufficient antenatal care, second trimester: Secondary | ICD-10-CM

## 2016-06-24 DIAGNOSIS — Z3481 Encounter for supervision of other normal pregnancy, first trimester: Secondary | ICD-10-CM | POA: Diagnosis not present

## 2016-06-24 DIAGNOSIS — O219 Vomiting of pregnancy, unspecified: Secondary | ICD-10-CM

## 2016-06-24 DIAGNOSIS — O0930 Supervision of pregnancy with insufficient antenatal care, unspecified trimester: Secondary | ICD-10-CM

## 2016-06-24 DIAGNOSIS — Z3480 Encounter for supervision of other normal pregnancy, unspecified trimester: Secondary | ICD-10-CM

## 2016-06-24 MED ORDER — ONDANSETRON HCL 8 MG PO TABS: 8.0000 mg | ORAL_TABLET | Freq: Three times a day (TID) | ORAL | 2 refills | Status: DC | PRN

## 2016-06-24 MED ORDER — PRENATE PIXIE 10-0.6-0.4-200 MG PO CAPS: 1.0000 | ORAL_CAPSULE | Freq: Every day | ORAL | 12 refills | Status: DC

## 2016-06-24 NOTE — Progress Notes (Signed)
Subjective:    Chelsea Wallace is being seen today for her first obstetrical visit.  This is not a planned pregnancy, was taking OCPs consistently and not missing, Descries LARK. She is at [redacted]w[redacted]d gestation. Her obstetrical history is significant for none. Relationship with FOB: significant other, living together. Patient does intend to breast feed. Pregnancy history fully reviewed.  The information documented in the HPI was reviewed and verified.  Menstrual History: OB History    Gravida Para Term Preterm AB Living   3 2 2  0 0 2   SAB TAB Ectopic Multiple Live Births   0 0 0 0 2       Patient's last menstrual period was 03/18/2016.    Past Medical History:  Diagnosis Date  . Abdominal pain affecting pregnancy 06/11/2016  . GERD (gastroesophageal reflux disease)    on prescription for it; pt cannot remember name of med.    Past Surgical History:  Procedure Laterality Date  . MANDIBLE RECONSTRUCTION     lower jaw surgery 2011     (Not in a hospital admission) No Known Allergies  Social History  Substance Use Topics  . Smoking status: Never Smoker  . Smokeless tobacco: Never Used  . Alcohol use No    Family History  Problem Relation Age of Onset  . Diabetes Mother   . Diabetes Maternal Grandfather   . Hypertension Paternal Grandmother   . Hypertension Paternal Grandfather      Review of Systems Constitutional: negative for weight loss Gastrointestinal: negative for vomiting Genitourinary:negative for genital lesions and vaginal discharge and dysuria Musculoskeletal:negative for back pain Behavioral/Psych: negative for abusive relationship, depression, illegal drug usage and tobacco use    Objective:    BP 124/82   Pulse 91   Wt 169 lb 14.4 oz (77.1 kg)   LMP 03/18/2016   BMI 28.27 kg/m  General Appearance:    Alert, cooperative, no distress, appears stated age  Head:    Normocephalic, without obvious abnormality, atraumatic  Eyes:    PERRL,  conjunctiva/corneas clear, EOM's intact, fundi    benign, both eyes  Ears:    Normal TM's and external ear canals, both ears  Nose:   Nares normal, septum midline, mucosa normal, no drainage    or sinus tenderness  Throat:   Lips, mucosa, and tongue normal; teeth and gums normal  Neck:   Supple, symmetrical, trachea midline, no adenopathy;    thyroid:  no enlargement/tenderness/nodules; no carotid   bruit or JVD  Back:     Symmetric, no curvature, ROM normal, no CVA tenderness  Lungs:     Clear to auscultation bilaterally, respirations unlabored  Chest Wall:    No tenderness or deformity   Heart:    Regular rate and rhythm, S1 and S2 normal, no murmur, rub   or gallop  Breast Exam:    No tenderness, masses, or nipple abnormality  Abdomen:     Soft, non-tender, bowel sounds active all four quadrants,    no masses, no organomegaly  Genitalia:    Normal female without lesion, discharge or tenderness  Extremities:   Extremities normal, atraumatic, no cyanosis or edema  Pulses:   2+ and symmetric all extremities  Skin:   Skin color, texture, turgor normal, no rashes or lesions  Lymph nodes:   Cervical, supraclavicular, and axillary nodes normal  Neurologic:   CNII-XII intact, normal strength, sensation and reflexes    throughout         Cervix: long,  thick, closed and posterior.  FHR: 145 by doppler.  FH: size c/w    dates.    Lab Review Urine pregnancy test Labs reviewed yes Radiologic studies reviewed yes Assessment & Plan    Pregnancy at 6342w0d weeks   1. Supervision of other normal pregnancy, antepartum      - Culture, OB Urine - Hemoglobinopathy evaluation - Obstetric Panel, Including HIV - Cytology - PAP - Cervicovaginal ancillary only - Hemoglobin A1c - VITAMIN D 25 Hydroxy (Vit-D Deficiency, Fractures) - Varicella zoster antibody, IgG - MaterniT21 PLUS Core+SCA - Prenat-FeAsp-Meth-FA-DHA w/o A (PRENATE PIXIE) 10-0.6-0.4-200 MG CAPS; Take 1 tablet by mouth daily.  Dispense:  30 capsule; Refill: 12  2. Late prenatal care    @14  weeks  3. Nausea and vomiting during pregnancy prior to [redacted] weeks gestation     Occasional Nausea.  Diet discussed.  - ondansetron (ZOFRAN) 8 MG tablet; Take 1 tablet (8 mg total) by mouth every 8 (eight) hours as needed for nausea or vomiting.  Dispense: 40 tablet; Refill: 2      Prenatal vitamins.  Counseling provided regarding continued use of seat belts, cessation of alcohol consumption, smoking or use of illicit drugs; infection precautions i.e., influenza/TDAP immunizations, toxoplasmosis,CMV, parvovirus, listeria and varicella; workplace safety, exercise during pregnancy; routine dental care, safe medications, sexual activity, hot tubs, saunas, pools, travel, caffeine use, fish and methlymercury, potential toxins, hair treatments, varicose veins Weight gain recommendations per IOM guidelines reviewed: underweight/BMI< 18.5--> gain 28 - 40 lbs; normal weight/BMI 18.5 - 24.9--> gain 25 - 35 lbs; overweight/BMI 25 - 29.9--> gain 15 - 25 lbs; obese/BMI >30->gain  11 - 20 lbs Problem list reviewed and updated. FIRST/CF mutation testing/NIPT/QUAD SCREEN/fragile X/Ashkenazi Jewish population testing/Spinal muscular atrophy discussed: ordered. Role of ultrasound in pregnancy discussed; fetal survey: ordered. Amniocentesis discussed: not indicated.  Meds ordered this encounter  Medications  . Prenat-FeAsp-Meth-FA-DHA w/o A (PRENATE PIXIE) 10-0.6-0.4-200 MG CAPS    Sig: Take 1 tablet by mouth daily.    Dispense:  30 capsule    Refill:  12    Please process coupon: Rx BIN: [id number], RxPCN: [id number], RxGRP: [id number]: [id number], Rx: [id number]: [id number]  SUF: 01  . ondansetron (ZOFRAN) 8 MG tablet    Sig: Take 1 tablet (8 mg total) by mouth every 8 (eight) hours as needed for nausea or vomiting.    Dispense:  40 tablet    Refill:  2   Orders Placed This Encounter  Procedures  . Culture, OB Urine  . US MFM OB COMP + 14 WK    Standing Status:   Future     Standing Expiration Date:   08/24/2017    Order Specific Question:   Reason for Exam (SYMPTOM  OR DIAGNOSIS REQUIRED)    Answer:   fetal anatomy scan, dating    Order Specific Question:   Preferred imaging location?    Answer:   [facility]  . Hemoglobinopathy evaluation  . Obstetric Panel, Including HIV  . Hemoglobin A1c  . VITAMIN D 25 Hydroxy (Vit-D Deficiency, Fractures)  . Varicella zoster antibody, IgG  . MaterniT21 PLUS Core+SCA    Order Specific Question:   Is patient insulin dependent?    Answer:   No    Order Specific Question:   Weight (lbs)    Answer:   168    Order Specific Question:   Gestational Age (GA), weeks    Answer:   3814    Order Specific Question:   Date on which patient was  at this GA    Answer:   06/24/2016    Order Specific Question:   GA Calculation Method    Answer:   LMP    Order Specific Question:   GA Date    Answer:   12/23/2016    Order Specific Question:   Number of fetuses    Answer:   1    Order Specific Question:   Donor egg?    Answer:   N    Order Specific Question:   Age of egg donor?    Answer:   25    Follow up in 4 weeks. 50% of 30 min visit spent on counseling and coordination of care.

## 2016-06-25 LAB — CERVICOVAGINAL ANCILLARY ONLY
BACTERIAL VAGINITIS: NEGATIVE
Candida vaginitis: NEGATIVE
Chlamydia: POSITIVE — AB
NEISSERIA GONORRHEA: NEGATIVE
Trichomonas: NEGATIVE

## 2016-06-26 ENCOUNTER — Other Ambulatory Visit: Payer: Self-pay | Admitting: Certified Nurse Midwife

## 2016-06-26 DIAGNOSIS — Z3480 Encounter for supervision of other normal pregnancy, unspecified trimester: Secondary | ICD-10-CM

## 2016-06-26 DIAGNOSIS — A749 Chlamydial infection, unspecified: Secondary | ICD-10-CM

## 2016-06-26 DIAGNOSIS — R7989 Other specified abnormal findings of blood chemistry: Secondary | ICD-10-CM | POA: Insufficient documentation

## 2016-06-26 DIAGNOSIS — O98812 Other maternal infectious and parasitic diseases complicating pregnancy, second trimester: Principal | ICD-10-CM

## 2016-06-26 LAB — OBSTETRIC PANEL, INCLUDING HIV
Antibody Screen: NEGATIVE
BASOS ABS: 0 10*3/uL (ref 0.0–0.2)
Basos: 0 %
EOS (ABSOLUTE): 0 10*3/uL (ref 0.0–0.4)
EOS: 1 %
HIV Screen 4th Generation wRfx: NONREACTIVE
Hematocrit: 36.9 % (ref 34.0–46.6)
Hemoglobin: 12.1 g/dL (ref 11.1–15.9)
Hepatitis B Surface Ag: NEGATIVE
IMMATURE GRANULOCYTES: 0 %
Immature Grans (Abs): 0 10*3/uL (ref 0.0–0.1)
LYMPHS ABS: 2 10*3/uL (ref 0.7–3.1)
Lymphs: 34 %
MCH: 30.5 pg (ref 26.6–33.0)
MCHC: 32.8 g/dL (ref 31.5–35.7)
MCV: 93 fL (ref 79–97)
MONOS ABS: 0.4 10*3/uL (ref 0.1–0.9)
Monocytes: 7 %
NEUTROS PCT: 58 %
Neutrophils Absolute: 3.3 10*3/uL (ref 1.4–7.0)
PLATELETS: 185 10*3/uL (ref 150–379)
RBC: 3.97 x10E6/uL (ref 3.77–5.28)
RDW: 13.9 % (ref 12.3–15.4)
RH TYPE: POSITIVE
RPR Ser Ql: NONREACTIVE
Rubella Antibodies, IGG: 5.82 index (ref >0.99)
WBC: 5.7 10*3/uL (ref 3.4–10.8)

## 2016-06-26 LAB — URINE CULTURE, OB REFLEX

## 2016-06-26 LAB — HEMOGLOBINOPATHY EVALUATION
HEMOGLOBIN A2 QUANTITATION: 2.3 % (ref 1.8–3.2)
HEMOGLOBIN F QUANTITATION: 0 % (ref 0.0–2.0)
HGB C: 0 %
HGB S: 0 %
HGB VARIANT: 0 %
Hgb A: 97.7 % (ref 96.4–98.8)

## 2016-06-26 LAB — HEMOGLOBIN A1C
Est. average glucose Bld gHb Est-mCnc: 105 mg/dL
Hgb A1c MFr Bld: 5.3 % (ref 4.8–5.6)

## 2016-06-26 LAB — VITAMIN D 25 HYDROXY (VIT D DEFICIENCY, FRACTURES): Vit D, 25-Hydroxy: 20.8 ng/mL — ABNORMAL LOW (ref 30.0–100.0)

## 2016-06-26 LAB — VARICELLA ZOSTER ANTIBODY, IGG: VARICELLA: 2997 {index} (ref >165)

## 2016-06-26 LAB — CULTURE, OB URINE

## 2016-06-26 MED ORDER — VITAMIN D (ERGOCALCIFEROL) 1.25 MG (50000 UNIT) PO CAPS: 50000.0000 [IU] | ORAL_CAPSULE | ORAL | 2 refills | Status: DC

## 2016-06-26 MED ORDER — AZITHROMYCIN 250 MG PO TABS: ORAL_TABLET | ORAL | 0 refills | Status: DC

## 2016-06-28 ENCOUNTER — Other Ambulatory Visit: Payer: Self-pay | Admitting: Certified Nurse Midwife

## 2016-06-28 DIAGNOSIS — R87612 Low grade squamous intraepithelial lesion on cytologic smear of cervix (LGSIL): Secondary | ICD-10-CM | POA: Insufficient documentation

## 2016-06-28 DIAGNOSIS — Z3480 Encounter for supervision of other normal pregnancy, unspecified trimester: Secondary | ICD-10-CM

## 2016-06-28 LAB — CYTOLOGY - PAP

## 2016-06-29 LAB — MATERNIT21 PLUS CORE+SCA
Chromosome 13: NEGATIVE
Chromosome 18: NEGATIVE
Chromosome 21: NEGATIVE
Y Chromosome: NOT DETECTED

## 2016-07-01 ENCOUNTER — Other Ambulatory Visit: Payer: Self-pay | Admitting: Certified Nurse Midwife

## 2016-07-01 DIAGNOSIS — Z3480 Encounter for supervision of other normal pregnancy, unspecified trimester: Secondary | ICD-10-CM

## 2016-07-22 ENCOUNTER — Ambulatory Visit (INDEPENDENT_AMBULATORY_CARE_PROVIDER_SITE_OTHER): Payer: Medicaid Other | Admitting: Certified Nurse Midwife

## 2016-07-22 VITALS — BP 105/71 | HR 80 | Wt 171.0 lb

## 2016-07-22 DIAGNOSIS — Z3480 Encounter for supervision of other normal pregnancy, unspecified trimester: Secondary | ICD-10-CM

## 2016-07-22 DIAGNOSIS — O219 Vomiting of pregnancy, unspecified: Secondary | ICD-10-CM

## 2016-07-22 DIAGNOSIS — A749 Chlamydial infection, unspecified: Secondary | ICD-10-CM

## 2016-07-22 DIAGNOSIS — O98812 Other maternal infectious and parasitic diseases complicating pregnancy, second trimester: Secondary | ICD-10-CM

## 2016-07-22 DIAGNOSIS — Z3482 Encounter for supervision of other normal pregnancy, second trimester: Secondary | ICD-10-CM

## 2016-07-22 MED ORDER — DOXYLAMINE-PYRIDOXINE 10-10 MG PO TBEC
DELAYED_RELEASE_TABLET | ORAL | 4 refills | Status: DC
Start: 2016-07-22 — End: 2016-09-12

## 2016-07-22 MED ORDER — AZITHROMYCIN 250 MG PO TABS: ORAL_TABLET | ORAL | 0 refills | Status: DC

## 2016-07-22 MED ORDER — PROMETHAZINE HCL 12.5 MG PO TABS: 12.5000 mg | ORAL_TABLET | Freq: Four times a day (QID) | ORAL | 2 refills | Status: DC | PRN
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
## 2016-07-22 NOTE — Progress Notes (Signed)
Pt states she is having period like cramps since yesterday, denies bleeding.

## 2016-07-28 ENCOUNTER — Inpatient Hospital Stay (HOSPITAL_COMMUNITY)
Admission: AD | Admit: 2016-07-28 | Discharge: 2016-07-28 | Disposition: A | Discharge status: Home / Self Care | Payer: Medicaid Other | Source: Ambulatory Visit | Attending: Obstetrics & Gynecology | Admitting: Obstetrics & Gynecology

## 2016-07-28 ENCOUNTER — Encounter (HOSPITAL_COMMUNITY): Payer: Self-pay | Admitting: *Deleted

## 2016-07-28 DIAGNOSIS — A568 Sexually transmitted chlamydial infection of other sites: Secondary | ICD-10-CM | POA: Insufficient documentation

## 2016-07-28 DIAGNOSIS — Z3A18 18 weeks gestation of pregnancy: Secondary | ICD-10-CM | POA: Insufficient documentation

## 2016-07-28 DIAGNOSIS — R109 Unspecified abdominal pain: Secondary | ICD-10-CM | POA: Diagnosis not present

## 2016-07-28 DIAGNOSIS — O98312 Other infections with a predominantly sexual mode of transmission complicating pregnancy, second trimester: Secondary | ICD-10-CM | POA: Diagnosis not present

## 2016-07-28 DIAGNOSIS — O98812 Other maternal infectious and parasitic diseases complicating pregnancy, second trimester: Secondary | ICD-10-CM | POA: Diagnosis not present

## 2016-07-28 DIAGNOSIS — A749 Chlamydial infection, unspecified: Secondary | ICD-10-CM

## 2016-07-28 DIAGNOSIS — O26892 Other specified pregnancy related conditions, second trimester: Secondary | ICD-10-CM

## 2016-07-28 LAB — URINALYSIS, ROUTINE W REFLEX MICROSCOPIC
Bilirubin Urine: NEGATIVE
GLUCOSE, UA: NEGATIVE mg/dL
Hgb urine dipstick: NEGATIVE
KETONES UR: NEGATIVE mg/dL
Nitrite: NEGATIVE
PROTEIN: NEGATIVE mg/dL
Specific Gravity, Urine: 1.023 (ref 1.005–1.030)
pH: 7 (ref 5.0–8.0)

## 2016-07-28 LAB — WET PREP, GENITAL
CLUE CELLS WET PREP: NONE SEEN
Sperm: NONE SEEN
TRICH WET PREP: NONE SEEN
Yeast Wet Prep HPF POC: NONE SEEN

## 2016-07-28 LAB — POCT FERN TEST: POCT FERN TEST: NEGATIVE

## 2016-07-28 LAB — AMNISURE RUPTURE OF MEMBRANE (ROM) NOT AT ARMC: AMNISURE: NEGATIVE

## 2016-07-28 MED ORDER — AZITHROMYCIN 250 MG PO TABS
1000.0000 mg | ORAL_TABLET | Freq: Once | ORAL | Status: AC
  Administered 2016-07-28: 1000 mg via ORAL
  Filled 2016-07-28: qty 4

## 2016-07-28 NOTE — MAU Provider Note (Signed)
Chief Complaint: Rupture of Membranes and Abdominal Pain   None     SUBJECTIVE HPI: Chelsea Wallace is a 26 y.o. G3P2002 at 4952w6d by LMP who presents to maternity admissions reporting she was standing at work and had a large gush of fluid that soaked her pants and ran down her leg. The leakage continued but was much less after this initial gush. The leaking is associated with abdominal cramping that is irregular and intermittent.  There are no other associated symptoms. She was diagnosed with chlamydia at her initial OB visit on 6/11, then last week her azithromycin Rx was resent to a different pharmacy and she has still not picked up the medications.  She denies vaginal bleeding, vaginal itching/burning, urinary symptoms, h/a, dizziness, n/v, or fever/chills.     HPI  Past Medical History:  Diagnosis Date  . Abdominal pain affecting pregnancy 06/11/2016  . GERD (gastroesophageal reflux disease)    on prescription for it; pt cannot remember name of med.   Past Surgical History:  Procedure Laterality Date  . MANDIBLE RECONSTRUCTION     lower jaw surgery 2011   Social History   Social History  . Marital status: Single    Spouse name: N/A  . Number of children: N/A  . Years of education: N/A   Occupational History  . Not on file.   Social History Main Topics  . Smoking status: Never Smoker  . Smokeless tobacco: Never Used  . Alcohol use No  . Drug use: No  . Sexual activity: Not Currently    Birth control/ protection: None   Other Topics Concern  . Not on file   Social History Narrative  . No narrative on file   No current facility-administered medications on file prior to encounter.    Current Outpatient Prescriptions on File Prior to Encounter  Medication Sig Dispense Refill  . acetaminophen (TYLENOL) 500 MG tablet Take 1,000 mg by mouth every 6 (six) hours as needed for moderate pain.    Marland Kitchen. azithromycin (ZITHROMAX) 250 MG tablet Take 4 tablets all together now. 4  tablet 0  . Doxylamine-Pyridoxine (DICLEGIS) 10-10 MG TBEC Take 1 tablet with breakfast and lunch.  Take 2 tablets at bedtime. 100 tablet 4  . ondansetron (ZOFRAN) 8 MG tablet Take 1 tablet (8 mg total) by mouth every 8 (eight) hours as needed for nausea or vomiting. 40 tablet 2  . Prenat-FeAsp-Meth-FA-DHA w/o A (PRENATE PIXIE) 10-0.6-0.4-200 MG CAPS Take 1 tablet by mouth daily. 30 capsule 12  . Prenatal Vit-Fe Fumarate-FA (PRENATAL MULTIVITAMIN) TABS tablet Take 1 tablet by mouth daily at 12 noon.    . promethazine (PHENERGAN) 12.5 MG tablet Take 1 tablet (12.5 mg total) by mouth every 6 (six) hours as needed for nausea or vomiting. 30 tablet 2  . Vitamin D, Ergocalciferol, (DRISDOL) 50000 units CAPS capsule Take 1 capsule (50,000 Units total) by mouth every 7 (seven) days. 30 capsule 2   No Known Allergies  ROS:  Review of Systems  Constitutional: Negative for chills, fatigue and fever.  Respiratory: Negative for shortness of breath.   Cardiovascular: Negative for chest pain.  Gastrointestinal: Negative for nausea and vomiting.  Genitourinary: Positive for pelvic pain and vaginal discharge. Negative for difficulty urinating, dysuria, flank pain, vaginal bleeding and vaginal pain.  Neurological: Negative for dizziness and headaches.  Psychiatric/Behavioral: Negative.      I have reviewed patient's Past Medical Hx, Surgical Hx, Family Hx, Social Hx, medications and allergies.   Physical Exam  Patient Vitals for the past 24 hrs:  BP Temp Temp src Pulse Resp SpO2 Weight  07/28/16 1643 116/66 98.3 F (36.8 C) Oral 86 16 99 % -  07/28/16 1639 - - - - - - 173 lb 0.6 oz (78.5 kg)   Constitutional: Well-developed, well-nourished female in no acute distress.  Cardiovascular: normal rate Respiratory: normal effort GI: Abd soft, non-tender. Pos BS x 4 MS: Extremities nontender, no edema, normal ROM Neurologic: Alert and oriented x 4.  GU: Neg CVAT.  PELVIC EXAM: Cervix pink, visually  closed, without lesion, scant white creamy discharge, vaginal walls and external genitalia normal Bimanual exam: Cervix 0/long/high, firm, anterior, neg CMT, uterus nontender, nonenlarged, adnexa without tenderness, enlargement, or mass  FHT 150 by doppler  LAB RESULTS Results for orders placed or performed during the hospital encounter of 07/28/16 (from the past 24 hour(s))  Urinalysis, Routine w reflex microscopic     Status: Abnormal   Collection Time: 07/28/16  4:37 PM  Result Value Ref Range   Color, Urine YELLOW YELLOW   APPearance HAZY (A) CLEAR   Specific Gravity, Urine 1.023 1.005 - 1.030   pH 7.0 5.0 - 8.0   Glucose, UA NEGATIVE NEGATIVE mg/dL   Hgb urine dipstick NEGATIVE NEGATIVE   Bilirubin Urine NEGATIVE NEGATIVE   Ketones, ur NEGATIVE NEGATIVE mg/dL   Protein, ur NEGATIVE NEGATIVE mg/dL   Nitrite NEGATIVE NEGATIVE   Leukocytes, UA LARGE (A) NEGATIVE   RBC / HPF 0-5 0 - 5 RBC/hpf   WBC, UA 6-30 0 - 5 WBC/hpf   Bacteria, UA RARE (A) NONE SEEN   Squamous Epithelial / LPF 6-30 (A) NONE SEEN   Mucous PRESENT   Amnisure rupture of membrane (rom)not at [facility]     Status: None   Collection Time: 07/28/16  5:12 PM  Result Value Ref Range   Amnisure ROM NEGATIVE   Wet prep, genital     Status: Abnormal   Collection Time: 07/28/16  5:12 PM  Result Value Ref Range   Yeast Wet Prep HPF POC NONE SEEN NONE SEEN   Trich, Wet Prep NONE SEEN NONE SEEN   Clue Cells Wet Prep HPF POC NONE SEEN NONE SEEN   WBC, Wet Prep HPF POC MANY (A) NONE SEEN   Sperm NONE SEEN   Fern Test     Status: None   Collection Time: 07/28/16  5:55 PM  Result Value Ref Range   POCT Fern Test Negative = intact amniotic membranes     O/Positive/-- (06/11 1537)  IMAGING No results found.  MAU Management/MDM: Ordered labs and reviewed results.  No evidence of ROM today.  Pt has not treated chlamydia because Rx was not at pharmacy, then was resent to different pharmacy and she could not find it.   Treated today with azithromycin 1 g PO x 1 dose. Rx written for expedited partner therapy for [facility].  Discussed risks of chlamydia in pregnancy including for preterm ROM and preterm labor.  Pt to f/u with [place] this week as ordered and prenatal visit next week.  TOC in 3-4 weeks. Pt stable at time of discharge.  ASSESSMENT 1. Chlamydia trachomatis infection in mother during second trimester of pregnancy   2. Abdominal pain during pregnancy in second trimester     PLAN Discharge home with preterm labor precautions  Allergies as of 07/28/2016   No Known Allergies     Medication List    TAKE these medications   acetaminophen 500 MG tablet  Commonly known as:  TYLENOL Take 1,000 mg by mouth every 6 (six) hours as needed for moderate pain.   azithromycin 250 MG tablet Commonly known as:  ZITHROMAX Take 4 tablets all together now.   Doxylamine-Pyridoxine 10-10 MG Tbec Commonly known as:  DICLEGIS Take 1 tablet with breakfast and lunch.  Take 2 tablets at bedtime.   ondansetron 8 MG tablet Commonly known as:  ZOFRAN Take 1 tablet (8 mg total) by mouth every 8 (eight) hours as needed for nausea or vomiting.   prenatal multivitamin Tabs tablet Take 1 tablet by mouth daily at 12 noon.   PRENATE PIXIE 10-0.6-0.4-200 MG Caps Take 1 tablet by mouth daily.   promethazine 12.5 MG tablet Commonly known as:  PHENERGAN Take 1 tablet (12.5 mg total) by mouth every 6 (six) hours as needed for nausea or vomiting.   Vitamin D (Ergocalciferol) 50000 units Caps capsule Commonly known as:  DRISDOL Take 1 capsule (50,000 Units total) by mouth every 7 (seven) days.      Follow-up Information    [facility] Follow up.   Why:  Return to MAU as needed for emergencies Contact information: [address] [place] [address] [phone]          Sharen Counter Certified Nurse-Midwife 07/28/2016  6:25 PM

## 2016-07-28 NOTE — MAU Note (Addendum)
+  vomiting x1 Was at work and had a "big gush of water" Clear fluid--has continued but not as much--per patient Denies vaginal bleeding Denies any recent intercourse +lower abdominal cramping that started following gush of water Rating 8/10 constant

## 2016-07-28 NOTE — Discharge Instructions (Signed)
Expedited Partner Therapy:  °Information Sheet for Patients and Partners  °            ° °You have been offered expedited partner therapy (EPT). This information sheet contains important information and warnings you need to be aware of, so please read it carefully.  ° °Expedited Partner Therapy (EPT) is the clinical practice of treating the sexual partners of persons who receive chlamydia, gonorrhea, or trichomoniasis diagnoses by providing medications or prescriptions to the patient. Patients then provide partners with these therapies without the health-care provider having examined the partner. In other words, EPT is a convenient, fast and private way for patients to help their sexual partners get treated.  ° °Chlamydia and gonorrhea are bacterial infections you get from having sex with a person who is already infected. Trichomoniasis (or “trich”) is a very common sexually transmitted infection (STI) that is caused by infection with a protozoan parasite called Trichomonas vaginalis.  Many people with these infections don’t know it because they feel fine, but without treatment these infections can cause serious health problems, such as pelvic inflammatory disease, ectopic pregnancy, infertility and increased risk of HIV.  ° °It is important to get treated as soon as possible to protect your health, to avoid spreading these infections to others, and to prevent yourself from becoming re-infected. The good news is these infections can be easily cured with proper antibiotic medicine. The best way to take care of your self is to see a doctor or go to your local health department. If you are not able to see a doctor or other medical provider, you should take EPT.  ° ° °Recommended Medication: °EPT for Chlamydia:  Azithromycin (Zithromax) 1 gram orally in a single dose °EPT for Gonorrhea:  Cefixime (Suprax) 400 milligrams orally in a single dose PLUS azithromycin (Zithromax) 1 gram orally in a single dose °EPT for  Trichomoniasis:  Metronidazole (Flagyl) 2 grams orally in a single dose ° ° °These medicines are very safe. However, you should not take them if you have ever had an allergic reaction (like a rash) to any of these medicines: azithromycin (Zithromax), erythromycin, clarithromycin (Biaxin), metronidazole (Flagyl), tinidazole (Tindimax). If you are uncertain about whether you have an allergy, call your medical provider or pharmacist before taking this medicine. If you have a serious, long-term illness like kidney, liver or heart disease, colitis or stomach problems, or you are currently taking other prescription medication, talk to your provider before taking this medication.  ° °Women: If you have lower belly pain, pain during sex, vomiting, or a fever, do not take this medicine. Instead, you should see a medical provider to be certain you do not have pelvic inflammatory disease (PID). PID can be serious and lead to infertility, pregnancy problems or chronic pelvic pain.  ° °Pregnant Women: It is very important for you to see a doctor to get pregnancy services and pre-natal care. These antibiotics for EPT are safe for pregnant women, but you still need to see a medical provider as soon as possible. It is also important to note that Doxycycline is an alternative therapy for chlamydia, but it should not be taken by someone who is pregnant.  ° °Men: If you have pain or swelling in the testicles or a fever, do not take this medicine and see a medical provider.    ° °Men who have sex with men (MSM): MSM in [place] continue to experience high rates of syphilis and HIV. Many MSM with gonorrhea or   chlamydia could also have syphilis and/or HIV and not know it. If you are a man who has sex with other men, it is very important that you see a medical provider and are tested for HIV and syphilis. EPT is not recommended for gonorrhea for MSM.  Recommended treatment for gonorrhea for MSM is Rocephin (shot) AND azithromycin  due to decreased cure rate.  Please see your medical provider if this is the case.   ° °Along with this information sheet is a prescription for the medicine. If you receive a prescription it will be in your name and will indicate your date of birth, or it will be in the name of “Expedited Partner Therapy”.   In either case, you can have the prescription filled at a pharmacy. You will be responsible for the cost of the medicine, unless you have prescription drug coverage. In that case, you could provide your name so the pharmacy could bill your health plan.  ° °Take the medication as directed. Some people will have a mild, upset stomach, which does not last long. AVOID alcohol 24 hours after taking metronidazole (Flagyl) to reduce the possibility of a disulfiram-like reaction (severe vomiting and abdominal pain).  After taking the medicine, do not have sex for 7 days. Do not share this medicine or give it to anyone else. It is important to tell everyone you have had sex with in the last 60 days that they need to go and get tested for sexually transmitted infections.  ° °Ways to prevent these and other sexually transmitted infections (STIs):  ° °• Abstain from sex. This is the only sure way to avoid getting an STI.  °• Use barrier methods, such as condoms, consistently and correctly.  °• Limit the number of sexual partners.  °• Have regular physical exams, including testing for STIs.  ° °For more information about EPT or other issues pertaining to an STI, please contact your medical provider or the [place] [facility] at [phone] or [email or web address]   ° °

## 2016-08-01 ENCOUNTER — Ambulatory Visit (HOSPITAL_COMMUNITY)
Admission: RE | Admit: 2016-08-01 | Discharge: 2016-08-01 | Disposition: A | Discharge status: Home / Self Care | Payer: Medicaid Other | Source: Ambulatory Visit | Attending: Certified Nurse Midwife | Admitting: Certified Nurse Midwife

## 2016-08-01 DIAGNOSIS — Z3A19 19 weeks gestation of pregnancy: Secondary | ICD-10-CM | POA: Insufficient documentation

## 2016-08-01 DIAGNOSIS — Z363 Encounter for antenatal screening for malformations: Secondary | ICD-10-CM | POA: Insufficient documentation

## 2016-08-01 DIAGNOSIS — Z3480 Encounter for supervision of other normal pregnancy, unspecified trimester: Secondary | ICD-10-CM

## 2016-08-02 ENCOUNTER — Other Ambulatory Visit: Payer: Self-pay | Admitting: Certified Nurse Midwife

## 2016-08-02 DIAGNOSIS — Z3480 Encounter for supervision of other normal pregnancy, unspecified trimester: Secondary | ICD-10-CM

## 2016-08-15 ENCOUNTER — Ambulatory Visit (INDEPENDENT_AMBULATORY_CARE_PROVIDER_SITE_OTHER): Payer: Medicaid Other | Admitting: Certified Nurse Midwife

## 2016-08-15 VITALS — BP 111/78 | HR 96 | Wt 173.0 lb

## 2016-08-15 DIAGNOSIS — O9989 Other specified diseases and conditions complicating pregnancy, childbirth and the puerperium: Secondary | ICD-10-CM

## 2016-08-15 DIAGNOSIS — E559 Vitamin D deficiency, unspecified: Secondary | ICD-10-CM

## 2016-08-15 DIAGNOSIS — O98812 Other maternal infectious and parasitic diseases complicating pregnancy, second trimester: Secondary | ICD-10-CM

## 2016-08-15 DIAGNOSIS — M549 Dorsalgia, unspecified: Secondary | ICD-10-CM

## 2016-08-15 DIAGNOSIS — O26892 Other specified pregnancy related conditions, second trimester: Secondary | ICD-10-CM

## 2016-08-15 DIAGNOSIS — Z3482 Encounter for supervision of other normal pregnancy, second trimester: Secondary | ICD-10-CM

## 2016-08-15 DIAGNOSIS — A749 Chlamydial infection, unspecified: Secondary | ICD-10-CM

## 2016-08-15 DIAGNOSIS — R7989 Other specified abnormal findings of blood chemistry: Secondary | ICD-10-CM

## 2016-08-15 DIAGNOSIS — O99891 Other specified diseases and conditions complicating pregnancy: Secondary | ICD-10-CM

## 2016-08-15 DIAGNOSIS — Z3480 Encounter for supervision of other normal pregnancy, unspecified trimester: Secondary | ICD-10-CM

## 2016-08-15 MED ORDER — AZITHROMYCIN 250 MG PO TABS: ORAL_TABLET | ORAL | 0 refills | Status: DC

## 2016-08-15 MED ORDER — CYCLOBENZAPRINE HCL 10 MG PO TABS: 10.0000 mg | ORAL_TABLET | Freq: Three times a day (TID) | ORAL | 1 refills | Status: DC | PRN

## 2016-08-15 NOTE — Progress Notes (Signed)
Patient reports feeling fetal movement, complains of small red spotting since Tuesday. Pt denies irritability/contractions.

## 2016-08-16 ENCOUNTER — Encounter: Payer: Self-pay | Admitting: Certified Nurse Midwife

## 2016-08-16 NOTE — Progress Notes (Signed)
   PRENATAL VISIT NOTE  Subjective:  Chelsea Wallace is a 26 y.o. G3P2002 at 7828w4d being seen today for ongoing prenatal care.  She is currently monitored for the following issues for this low-risk pregnancy and has Chlamydia infection affecting pregnancy in second trimester; Supervision of normal pregnancy, antepartum; Late prenatal care; Low vitamin D level; and LGSIL on Pap smear of cervix on her problem list.  Patient reports no contractions, no cramping, no leaking and some vaginal spotting.  Contractions: Not present. Vag. Bleeding: None.  Movement: Present. Denies leaking of fluid.   The following portions of the patient's history were reviewed and updated as appropriate: allergies, current medications, past family history, past medical history, past social history, past surgical history and problem list. Problem list updated.  Objective:   Vitals:   08/15/16 1503  BP: 111/78  Pulse: 96  Weight: 173 lb (78.5 kg)    Fetal Status: Fetal Heart Rate (bpm): 150 Fundal Height: 21 cm Movement: Present     General:  Alert, oriented and cooperative. Patient is in no acute distress.  Skin: Skin is warm and dry. No rash noted.   Cardiovascular: Normal heart rate noted  Respiratory: Normal respiratory effort, no problems with respiration noted  Abdomen: Soft, gravid, appropriate for gestational age.  Pain/Pressure: Present     Pelvic: Cervical exam deferred        Extremities: Normal range of motion.  Edema: None  Mental Status:  Normal mood and affect. Normal behavior. Normal judgment and thought content.   Assessment and Plan:  Pregnancy: G3P2002 at 4428w4d  1. Supervision of other normal pregnancy, antepartum       2. Chlamydia infection affecting pregnancy in second trimester     [facility]  FOB treated as well.  She states she watched him take it this time.  Then had sexual intercourse 4 days later.   Retreated today and told no sex this pregnancy. Verbalized understanding.  - azithromycin (ZITHROMAX) 250 MG tablet; Take 4 tablets all together now.  Dispense: 4 tablet; Refill: 0  3. Low vitamin D level     Taking weekly vitamin D.   4. 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  States that they are working 10 hour days but her back keeps spasing.  Desires note to be out of work, has not been since Monday.  Is starting a new job possibly.  Letter written for out of work d/t back pain.  Too early for maternity support belt.  Tylenol encouraged along with stretching.    - cyclobenzaprine (FLEXERIL) 10 MG tablet; Take 1 tablet (10 mg total) by mouth every 8 (eight) hours as needed for muscle spasms.  Dispense: 30 tablet; Refill: 1  Preterm labor symptoms and general obstetric precautions including but not limited to vaginal bleeding, contractions, leaking of fluid and fetal movement were reviewed in detail with the patient. Please refer to After Visit Summary for other counseling recommendations.  Return in about 4 weeks (around 09/12/2016) for ROB.   Roe Coombsachelle A Sheccid Lahmann, CNM

## 2016-08-19 ENCOUNTER — Encounter: Payer: Medicaid Other | Admitting: Certified Nurse Midwife

## 2016-08-21 ENCOUNTER — Encounter: Payer: Self-pay | Admitting: Obstetrics and Gynecology

## 2016-09-09 ENCOUNTER — Telehealth (HOSPITAL_COMMUNITY): Payer: Self-pay

## 2016-09-09 NOTE — Telephone Encounter (Signed)
Pt called saying she wanted to make sure we had her work note ready. Stated that she was supposed to come pick it up yesterday (8/26) but did not feel good. Upon looking at pt's chart, pt has not been seen in MAU since 07/28/2016.

## 2016-09-12 ENCOUNTER — Encounter: Payer: Self-pay | Admitting: *Deleted

## 2016-09-12 ENCOUNTER — Ambulatory Visit (INDEPENDENT_AMBULATORY_CARE_PROVIDER_SITE_OTHER): Payer: Medicaid Other | Admitting: Certified Nurse Midwife

## 2016-09-12 ENCOUNTER — Other Ambulatory Visit (HOSPITAL_COMMUNITY)
Admission: RE | Admit: 2016-09-12 | Discharge: 2016-09-12 | Disposition: A | Discharge status: Home / Self Care | Payer: Medicaid Other | Source: Ambulatory Visit | Attending: Certified Nurse Midwife | Admitting: Certified Nurse Midwife

## 2016-09-12 VITALS — BP 112/72 | HR 89 | Wt 180.0 lb

## 2016-09-12 DIAGNOSIS — R7989 Other specified abnormal findings of blood chemistry: Secondary | ICD-10-CM

## 2016-09-12 DIAGNOSIS — L900 Lichen sclerosus et atrophicus: Secondary | ICD-10-CM

## 2016-09-12 DIAGNOSIS — M549 Dorsalgia, unspecified: Secondary | ICD-10-CM

## 2016-09-12 DIAGNOSIS — O4702 False labor before 37 completed weeks of gestation, second trimester: Secondary | ICD-10-CM

## 2016-09-12 DIAGNOSIS — O98812 Other maternal infectious and parasitic diseases complicating pregnancy, second trimester: Secondary | ICD-10-CM

## 2016-09-12 DIAGNOSIS — O99891 Other specified diseases and conditions complicating pregnancy: Secondary | ICD-10-CM

## 2016-09-12 DIAGNOSIS — Z3480 Encounter for supervision of other normal pregnancy, unspecified trimester: Secondary | ICD-10-CM

## 2016-09-12 DIAGNOSIS — A749 Chlamydial infection, unspecified: Secondary | ICD-10-CM

## 2016-09-12 DIAGNOSIS — B373 Candidiasis of vulva and vagina: Secondary | ICD-10-CM

## 2016-09-12 DIAGNOSIS — O26899 Other specified pregnancy related conditions, unspecified trimester: Secondary | ICD-10-CM

## 2016-09-12 DIAGNOSIS — O219 Vomiting of pregnancy, unspecified: Secondary | ICD-10-CM

## 2016-09-12 DIAGNOSIS — Z3482 Encounter for supervision of other normal pregnancy, second trimester: Secondary | ICD-10-CM

## 2016-09-12 DIAGNOSIS — O9989 Other specified diseases and conditions complicating pregnancy, childbirth and the puerperium: Secondary | ICD-10-CM

## 2016-09-12 DIAGNOSIS — R87612 Low grade squamous intraepithelial lesion on cytologic smear of cervix (LGSIL): Secondary | ICD-10-CM

## 2016-09-12 DIAGNOSIS — B3731 Acute candidiasis of vulva and vagina: Secondary | ICD-10-CM

## 2016-09-12 DIAGNOSIS — R102 Pelvic and perineal pain: Secondary | ICD-10-CM

## 2016-09-12 DIAGNOSIS — E559 Vitamin D deficiency, unspecified: Secondary | ICD-10-CM

## 2016-09-12 MED ORDER — CYCLOBENZAPRINE HCL 10 MG PO TABS: 10.0000 mg | ORAL_TABLET | Freq: Three times a day (TID) | ORAL | 1 refills | Status: DC | PRN

## 2016-09-12 MED ORDER — FLUCONAZOLE 150 MG PO TABS: 150.0000 mg | ORAL_TABLET | Freq: Once | ORAL | 0 refills | Status: AC

## 2016-09-12 MED ORDER — CLOBETASOL PROPIONATE 0.05 % EX OINT: 1.0000 "application " | TOPICAL_OINTMENT | Freq: Two times a day (BID) | CUTANEOUS | 0 refills | Status: DC

## 2016-09-12 MED ORDER — VITAMIN D (ERGOCALCIFEROL) 1.25 MG (50000 UNIT) PO CAPS: 50000.0000 [IU] | ORAL_CAPSULE | ORAL | 2 refills | Status: DC

## 2016-09-12 MED ORDER — COMFORT FIT MATERNITY SUPP LG MISC: 1.0000 [IU] | Freq: Every day | 0 refills | Status: DC

## 2016-09-12 MED ORDER — PROCHLORPERAZINE MALEATE 10 MG PO TABS: 10.0000 mg | ORAL_TABLET | Freq: Four times a day (QID) | ORAL | 0 refills | Status: DC | PRN

## 2016-09-12 MED ORDER — TERCONAZOLE 0.8 % VA CREA: 1.0000 | TOPICAL_CREAM | Freq: Every day | VAGINAL | 0 refills | Status: DC
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
## 2016-09-12 NOTE — Progress Notes (Signed)
Pt states she has been having sharp lower pelvic pain since yesterday. Pt also complains of back pain and some swelling.

## 2016-09-13 ENCOUNTER — Ambulatory Visit (HOSPITAL_COMMUNITY)
Admission: RE | Admit: 2016-09-13 | Discharge: 2016-09-13 | Disposition: A | Discharge status: Home / Self Care | Payer: Medicaid Other | Source: Ambulatory Visit | Attending: Certified Nurse Midwife | Admitting: Certified Nurse Midwife

## 2016-09-13 ENCOUNTER — Other Ambulatory Visit: Payer: Self-pay | Admitting: Certified Nurse Midwife

## 2016-09-13 DIAGNOSIS — Z3686 Encounter for antenatal screening for cervical length: Secondary | ICD-10-CM | POA: Insufficient documentation

## 2016-09-13 DIAGNOSIS — Z3480 Encounter for supervision of other normal pregnancy, unspecified trimester: Secondary | ICD-10-CM

## 2016-09-13 DIAGNOSIS — Z3A25 25 weeks gestation of pregnancy: Secondary | ICD-10-CM | POA: Insufficient documentation

## 2016-09-13 DIAGNOSIS — O4702 False labor before 37 completed weeks of gestation, second trimester: Secondary | ICD-10-CM | POA: Diagnosis not present

## 2016-09-13 LAB — CERVICOVAGINAL ANCILLARY ONLY
BACTERIAL VAGINITIS: NEGATIVE
CANDIDA VAGINITIS: POSITIVE — AB
Chlamydia: NEGATIVE
Neisseria Gonorrhea: NEGATIVE
TRICH (WINDOWPATH): NEGATIVE

## 2016-09-13 LAB — FETAL FIBRONECTIN: FETAL FIBRONECTIN: NEGATIVE

## 2016-09-17 ENCOUNTER — Other Ambulatory Visit: Payer: Self-pay | Admitting: Certified Nurse Midwife

## 2016-09-25 ENCOUNTER — Telehealth: Payer: Self-pay | Admitting: *Deleted

## 2016-09-25 NOTE — Telephone Encounter (Signed)
Pt called to office asking if she could use [employer] products during pregnancy.  Reviewed with R.Denney, CNM, ok for pt to use.   Pt also ask what she should do since she has been having some pinkish,blood like, mucous d/c. Pt was [facility]. Pt states it looks like her mucous plug but too early for that. Pt states +FM, denies any cramping, +low back pain, unsure if ctx. Pt advised to be evaluated today. Pt states it may be a couple hours [facility]

## 2016-09-26 ENCOUNTER — Telehealth: Payer: Self-pay | Admitting: *Deleted

## 2016-09-26 NOTE — Telephone Encounter (Signed)
error 

## 2016-10-03 ENCOUNTER — Other Ambulatory Visit: Payer: Medicaid Other

## 2016-10-03 ENCOUNTER — Encounter: Payer: Medicaid Other | Admitting: Certified Nurse Midwife

## 2016-10-04 ENCOUNTER — Ambulatory Visit (INDEPENDENT_AMBULATORY_CARE_PROVIDER_SITE_OTHER): Payer: Medicaid Other | Admitting: Certified Nurse Midwife

## 2016-10-04 ENCOUNTER — Encounter: Payer: Self-pay | Admitting: *Deleted

## 2016-10-04 ENCOUNTER — Other Ambulatory Visit: Payer: Medicaid Other

## 2016-10-04 VITALS — BP 120/77 | HR 97 | Wt 180.7 lb

## 2016-10-04 DIAGNOSIS — R87612 Low grade squamous intraepithelial lesion on cytologic smear of cervix (LGSIL): Secondary | ICD-10-CM

## 2016-10-04 DIAGNOSIS — A749 Chlamydial infection, unspecified: Secondary | ICD-10-CM

## 2016-10-04 DIAGNOSIS — O98812 Other maternal infectious and parasitic diseases complicating pregnancy, second trimester: Secondary | ICD-10-CM

## 2016-10-04 DIAGNOSIS — R7989 Other specified abnormal findings of blood chemistry: Secondary | ICD-10-CM

## 2016-10-04 DIAGNOSIS — Z3490 Encounter for supervision of normal pregnancy, unspecified, unspecified trimester: Secondary | ICD-10-CM

## 2016-10-04 DIAGNOSIS — E559 Vitamin D deficiency, unspecified: Secondary | ICD-10-CM

## 2016-10-04 MED ORDER — VITAMIN D (ERGOCALCIFEROL) 1.25 MG (50000 UNIT) PO CAPS: 50000.0000 [IU] | ORAL_CAPSULE | ORAL | 2 refills | Status: DC

## 2016-10-04 NOTE — Progress Notes (Signed)
ROB/GTT. TDAP vaccines. Complains of pressure and back pain, the maternity belt does not work.

## 2016-10-04 NOTE — Progress Notes (Signed)
   PRENATAL VISIT NOTE  Subjective:  Chelsea Wallace is a 26 y.o. G3P2002 at [redacted]w[redacted]d being seen today for ongoing prenatal care.  She is currently monitored for the following issues for this low-risk pregnancy and has Chlamydia infection affecting pregnancy in second trimester; Supervision of normal pregnancy, antepartum; Late prenatal care; Low vitamin D level; and LGSIL on Pap smear of cervix on her problem list.  Patient reports backache, no bleeding, no contractions, no cramping, no leaking and round ligament pain, pelvic pressure, denies LOF.  Has tried OTC Tylenol, maternity support belt, sitz baths.  Yoga streching encouraged.  Contractions: Not present. Vag. Bleeding: None.  Movement: Present. Denies leaking of fluid.   The following portions of the patient's history were reviewed and updated as appropriate: allergies, current medications, past family history, past medical history, past social history, past surgical history and problem list. Problem list updated.  Objective:   Vitals:   10/04/16 0940  BP: 120/77  Pulse: 97  Weight: 180 lb 11.2 oz (82 kg)    Fetal Status: Fetal Heart Rate (bpm): 135; doppler Fundal Height: 28 cm Movement: Present     General:  Alert, oriented and cooperative. Patient is in no acute distress.  Skin: Skin is warm and dry. No rash noted.   Cardiovascular: Normal heart rate noted  Respiratory: Normal respiratory effort, no problems with respiration noted  Abdomen: Soft, gravid, appropriate for gestational age.  Pain/Pressure: Present     Pelvic: Cervical exam deferred        Extremities: Normal range of motion.  Edema: Trace  Mental Status:  Normal mood and affect. Normal behavior. Normal judgment and thought content.   Assessment and Plan:  Pregnancy: G3P2002 at [redacted]w[redacted]d  1. Encounter for supervision of normal pregnancy, antepartum, unspecified gravidity     Back pain/round ligament pain.  Letter written for bathroom breaks at work.   2. Chlamydia  infection affecting pregnancy in second trimester     TOC negative  3. Low vitamin D level     - Vitamin D, Ergocalciferol, (DRISDOL) 50000 units CAPS capsule; Take 1 capsule (50,000 Units total) by mouth every 7 (seven) days.  Dispense: 30 capsule; Refill: 2  4. LGSIL on Pap smear of cervix     Colpo postpartum  Preterm labor symptoms and general obstetric precautions including but not limited to vaginal bleeding, contractions, leaking of fluid and fetal movement were reviewed in detail with the patient. Please refer to After Visit Summary for other counseling recommendations.  Return in about 2 weeks (around 10/18/2016) for ROB.   Roe Coombs, CNM

## 2016-10-05 LAB — GLUCOSE TOLERANCE, 2 HOURS W/ 1HR
GLUCOSE, 1 HOUR: 147 mg/dL (ref 65–179)
GLUCOSE, 2 HOUR: 108 mg/dL (ref 65–152)
GLUCOSE, FASTING: 84 mg/dL (ref 65–91)

## 2016-10-05 LAB — CBC
HEMOGLOBIN: 11 g/dL — AB (ref 11.1–15.9)
Hematocrit: 34 % (ref 34.0–46.6)
MCH: 29.9 pg (ref 26.6–33.0)
MCHC: 32.4 g/dL (ref 31.5–35.7)
MCV: 92 fL (ref 79–97)
PLATELETS: 176 10*3/uL (ref 150–379)
RBC: 3.68 x10E6/uL — AB (ref 3.77–5.28)
RDW: 13.8 % (ref 12.3–15.4)
WBC: 7.2 10*3/uL (ref 3.4–10.8)

## 2016-10-05 LAB — HIV ANTIBODY (ROUTINE TESTING W REFLEX): HIV SCREEN 4TH GENERATION: NONREACTIVE

## 2016-10-05 LAB — RPR: RPR Ser Ql: NONREACTIVE

## 2016-10-08 ENCOUNTER — Other Ambulatory Visit: Payer: Self-pay | Admitting: Certified Nurse Midwife

## 2016-10-08 DIAGNOSIS — Z3480 Encounter for supervision of other normal pregnancy, unspecified trimester: Secondary | ICD-10-CM

## 2016-10-14 ENCOUNTER — Encounter (HOSPITAL_COMMUNITY): Payer: Self-pay | Admitting: *Deleted

## 2016-10-14 ENCOUNTER — Inpatient Hospital Stay (HOSPITAL_COMMUNITY)
Admission: AD | Admit: 2016-10-14 | Discharge: 2016-10-14 | Disposition: A | Discharge status: Home / Self Care | Payer: Medicaid Other | Source: Ambulatory Visit | Attending: Obstetrics and Gynecology | Admitting: Obstetrics and Gynecology

## 2016-10-14 DIAGNOSIS — K529 Noninfective gastroenteritis and colitis, unspecified: Secondary | ICD-10-CM

## 2016-10-14 DIAGNOSIS — R102 Pelvic and perineal pain: Secondary | ICD-10-CM | POA: Diagnosis present

## 2016-10-14 DIAGNOSIS — O219 Vomiting of pregnancy, unspecified: Secondary | ICD-10-CM

## 2016-10-14 DIAGNOSIS — O212 Late vomiting of pregnancy: Secondary | ICD-10-CM | POA: Insufficient documentation

## 2016-10-14 DIAGNOSIS — Z3A30 30 weeks gestation of pregnancy: Secondary | ICD-10-CM | POA: Diagnosis not present

## 2016-10-14 DIAGNOSIS — K219 Gastro-esophageal reflux disease without esophagitis: Secondary | ICD-10-CM | POA: Insufficient documentation

## 2016-10-14 DIAGNOSIS — O9989 Other specified diseases and conditions complicating pregnancy, childbirth and the puerperium: Secondary | ICD-10-CM | POA: Diagnosis not present

## 2016-10-14 DIAGNOSIS — Z833 Family history of diabetes mellitus: Secondary | ICD-10-CM | POA: Diagnosis not present

## 2016-10-14 DIAGNOSIS — O99613 Diseases of the digestive system complicating pregnancy, third trimester: Secondary | ICD-10-CM | POA: Diagnosis not present

## 2016-10-14 LAB — COMPREHENSIVE METABOLIC PANEL
ALBUMIN: 3.3 g/dL — AB (ref 3.5–5.0)
ALT: 11 U/L — ABNORMAL LOW (ref 14–54)
ANION GAP: 6 (ref 5–15)
AST: 16 U/L (ref 15–41)
Alkaline Phosphatase: 96 U/L (ref 38–126)
BILIRUBIN TOTAL: 0.9 mg/dL (ref 0.3–1.2)
BUN: 8 mg/dL (ref 6–20)
CALCIUM: 8.8 mg/dL — AB (ref 8.9–10.3)
CO2: 24 mmol/L (ref 22–32)
CREATININE: 0.53 mg/dL (ref 0.44–1.00)
Chloride: 108 mmol/L (ref 101–111)
GLUCOSE: 106 mg/dL — AB (ref 65–99)
Potassium: 4 mmol/L (ref 3.5–5.1)
Sodium: 138 mmol/L (ref 135–145)
TOTAL PROTEIN: 6.9 g/dL (ref 6.5–8.1)

## 2016-10-14 LAB — URINALYSIS, ROUTINE W REFLEX MICROSCOPIC
Bilirubin Urine: NEGATIVE
GLUCOSE, UA: NEGATIVE mg/dL
HGB URINE DIPSTICK: NEGATIVE
Ketones, ur: 80 mg/dL — AB
Leukocytes, UA: NEGATIVE
Nitrite: NEGATIVE
PROTEIN: 30 mg/dL — AB
Specific Gravity, Urine: 1.025 (ref 1.005–1.030)
pH: 5 (ref 5.0–8.0)

## 2016-10-14 LAB — LIPASE, BLOOD: Lipase: 27 U/L (ref 11–51)

## 2016-10-14 LAB — WET PREP, GENITAL
Clue Cells Wet Prep HPF POC: NONE SEEN
SPERM: NONE SEEN
Trich, Wet Prep: NONE SEEN
Yeast Wet Prep HPF POC: NONE SEEN

## 2016-10-14 LAB — CBC
HEMATOCRIT: 32.2 % — AB (ref 36.0–46.0)
Hemoglobin: 10.7 g/dL — ABNORMAL LOW (ref 12.0–15.0)
MCH: 29.6 pg (ref 26.0–34.0)
MCHC: 33.2 g/dL (ref 30.0–36.0)
MCV: 89 fL (ref 78.0–100.0)
Platelets: 170 10*3/uL (ref 150–400)
RBC: 3.62 MIL/uL — ABNORMAL LOW (ref 3.87–5.11)
RDW: 13.5 % (ref 11.5–15.5)
WBC: 8.5 10*3/uL (ref 4.0–10.5)

## 2016-10-14 MED ORDER — PROMETHAZINE HCL 25 MG PO TABS: 25.0000 mg | ORAL_TABLET | Freq: Four times a day (QID) | ORAL | 0 refills | Status: DC | PRN

## 2016-10-14 NOTE — Discharge Instructions (Signed)
Food Choices to Help Relieve Diarrhea, Adult When you have diarrhea, the foods you eat and your eating habits are very important. Choosing the right foods and drinks can help:  Relieve diarrhea.  Replace lost fluids and nutrients.  Prevent dehydration.  What general guidelines should I follow? Relieving diarrhea  Choose foods with less than 2 g or .07 oz. of fiber per serving.  Limit fats to less than 8 tsp (38 g or 1.34 oz.) a day.  Avoid the following: ? Foods and beverages sweetened with high-fructose corn syrup, honey, or sugar alcohols such as xylitol, sorbitol, and mannitol. ? Foods that contain a lot of fat or sugar. ? Fried, greasy, or spicy foods. ? High-fiber grains, breads, and cereals. ? Raw fruits and vegetables.  Eat foods that are rich in probiotics. These foods include dairy products such as yogurt and fermented milk products. They help increase healthy bacteria in the stomach and intestines (gastrointestinal tract, or GI tract).  If you have lactose intolerance, avoid dairy products. These may make your diarrhea worse.  Take medicine to help stop diarrhea (antidiarrheal medicine) only as told by your health care provider. Replacing nutrients  Eat small meals or snacks every 3-4 hours.  Eat bland foods, such as white rice, toast, or baked potato, until your diarrhea starts to get better. Gradually reintroduce nutrient-rich foods as tolerated or as told by your health care provider. This includes: ? Well-cooked protein foods. ? Peeled, seeded, and soft-cooked fruits and vegetables. ? Low-fat dairy products.  Take vitamin and mineral supplements as told by your health care provider. Preventing dehydration   Start by sipping water or a special solution to prevent dehydration (oral rehydration solution, ORS). Urine that is clear or pale yellow means that you are getting enough fluid.  Try to drink at least 8-10 cups of fluid each day to help replace lost  fluids.  You may add other liquids in addition to water, such as clear juice or decaffeinated sports drinks, as tolerated or as told by your health care provider.  Avoid drinks with caffeine, such as coffee, tea, or soft drinks.  Avoid alcohol. What foods are recommended? The items listed may not be a complete list. Talk with your health care provider about what dietary choices are best for you. Grains White rice. White, French, or pita breads (fresh or toasted), including plain rolls, buns, or bagels. White pasta. Saltine, soda, or graham crackers. Pretzels. Low-fiber cereal. Cooked cereals made with water (such as cornmeal, farina, or cream cereals). Plain muffins. Matzo. Melba toast. Zwieback. Vegetables Potatoes (without the skin). Most well-cooked and canned vegetables without skins or seeds. Tender lettuce. Fruits Apple sauce. Fruits canned in juice. Cooked apricots, cherries, grapefruit, peaches, pears, or plums. Fresh bananas and cantaloupe. Meats and other protein foods Baked or boiled chicken. Eggs. Tofu. Fish. Seafood. Smooth nut butters. Ground or well-cooked tender beef, ham, veal, lamb, pork, or poultry. Dairy Plain yogurt, kefir, and unsweetened liquid yogurt. Lactose-free milk, buttermilk, skim milk, or soy milk. Low-fat or nonfat hard cheese. Beverages Water. Low-calorie sports drinks. Fruit juices without pulp. Strained tomato and vegetable juices. Decaffeinated teas. Sugar-free beverages not sweetened with sugar alcohols. Oral rehydration solutions, if approved by your health care provider. Seasoning and other foods Bouillon, broth, or soups made from recommended foods. What foods are not recommended? The items listed may not be a complete list. Talk with your health care provider about what dietary choices are best for you. Grains Whole grain, whole wheat,   bran, or rye breads, rolls, pastas, and crackers. Wild or brown rice. Whole grain or bran cereals. Barley. Oats and  oatmeal. Corn tortillas or taco shells. Granola. Popcorn. Vegetables Raw vegetables. Fried vegetables. Cabbage, broccoli, Brussels sprouts, artichokes, baked beans, beet greens, corn, kale, legumes, peas, sweet potatoes, and yams. Potato skins. Cooked spinach and cabbage. Fruits Dried fruit, including raisins and dates. Raw fruits. Stewed or dried prunes. Canned fruits with syrup. Meat and other protein foods Fried or fatty meats. Deli meats. Chunky nut butters. Nuts and seeds. Beans and lentils. Bacon. Hot dogs. Sausage. Dairy High-fat cheeses. Whole milk, chocolate milk, and beverages made with milk, such as milk shakes. Half-and-half. Cream. sour cream. Ice cream. Beverages Caffeinated beverages (such as coffee, tea, soda, or energy drinks). Alcoholic beverages. Fruit juices with pulp. Prune juice. Soft drinks sweetened with high-fructose corn syrup or sugar alcohols. High-calorie sports drinks. Fats and oils Butter. Cream sauces. Margarine. Salad oils. Plain salad dressings. Olives. Avocados. Mayonnaise. Sweets and desserts Sweet rolls, doughnuts, and sweet breads. Sugar-free desserts sweetened with sugar alcohols such as xylitol and sorbitol. Seasoning and other foods Honey. Hot sauce. Chili powder. Gravy. Cream-based or milk-based soups. Pancakes and waffles. Summary  When you have diarrhea, the foods you eat and your eating habits are very important.  Make sure you get at least 8-10 cups of fluid each day, or enough to keep your urine clear or pale yellow.  Eat bland foods and gradually reintroduce healthy, nutrient-rich foods as tolerated, or as told by your health care provider.  Avoid high-fiber, fried, greasy, or spicy foods. This information is not intended to replace advice given to you by your health care provider. Make sure you discuss any questions you have with your health care provider. Document Released: 03/23/2003 Document Revised: 12/29/2015 Document Reviewed:  12/29/2015 Elsevier Interactive Patient Education  2017 [employer]vier Inc[employer]  

## 2016-10-14 NOTE — MAU Provider Note (Signed)
History     CSN: [id number]  Arrival date and time: 10/14/16 1639  Chief Complaint  Patient presents with  . pelvic pressure  . Emesis  . Diarrhea  . Abdominal Pain    HPI: Chelsea Wallace is a 26 y.o. [id number] with IUP at [redacted]w[redacted]d who presents to maternity admissions reporting c/o pelvic and rectal pressure since last night. She also reports vomiting and diarrhea which started 2 days ago. Reports having 3-4 BMs today, and 4-5 episodes of emesis today. Has been able to keep some fluids down.  Also reports LLQ pain radiating to L thigh last night and earlier today, reports this felt like contractions, but they have resolved. She denies any fever, chills, hematemesis, vaginal discharge, dysuria, hematuria or urinary frequency. Also denies any leakage of fluid or vaginal bleeding. Good fetal movement.   She receives [facility]. Pregnancy complicated by chlamydia in 2nd trimester (neg TOC), late PNC, and LGSIL pap.  Past obstetric history: OB History  Gravida Para Term Preterm AB Living  0 0 2  SAB TAB Ectopic Multiple Live Births  0 0 0 0 2    # Outcome Date GA Lbr Len/2nd Weight Sex Delivery Anes PTL Lv  3 Current           2 Term 01/31/15 [redacted]w[redacted]d 12:33 / 00:17 8 lb 8.2 oz (3.86 kg) M Vag-Spont EPI  LIV     Birth Comments: WNL  1 Term 05/23/13 [redacted]w[redacted]d 31:30 / 01:29 7 lb 9.5 oz (3.445 kg) M Vag-Spont EPI  LIV      Past Medical History:  Diagnosis Date  . GERD (gastroesophageal reflux disease)    on prescription for it; pt cannot remember name of med.    Past Surgical History:  Procedure Laterality Date  . MANDIBLE RECONSTRUCTION     lower jaw surgery 2011    Family History  Problem Relation Age of Onset  . Diabetes Mother   . Diabetes Maternal Grandfather   . Hypertension Paternal Grandmother   . Hypertension Paternal Grandfather     Social History  Substance Use Topics  . Smoking status: Never Smoker  . Smokeless tobacco: Never Used  . Alcohol use No     Allergies: No Known Allergies  Prescriptions Prior to Admission  Medication Sig Dispense Refill Last Dose  . acetaminophen (TYLENOL) 500 MG tablet Take 1,000 mg by mouth every 6 (six) hours as needed for moderate pain.   Not Taking  . cyclobenzaprine (FLEXERIL) 10 MG tablet Take 1 tablet (10 mg total) by mouth every 8 (eight) hours as needed for muscle spasms. (Patient not taking: Reported on 10/04/2016) 30 tablet 1 Not Taking  . Elastic Bandages & Supports (COMFORT FIT MATERNITY SUPP LG) MISC 1 Units by Does not apply route daily. 1 each 0 Taking  . Prenat-FeAsp-Meth-FA-DHA w/o A (PRENATE PIXIE) 10-0.6-0.4-200 MG CAPS Take 1 tablet by mouth daily. 30 capsule 12 Taking  . Prenatal Vit-Fe Fumarate-FA (PRENATAL MULTIVITAMIN) TABS tablet Take 1 tablet by mouth daily at 12 noon.   Not Taking  . Vitamin D, Ergocalciferol, (DRISDOL) 50000 units CAPS capsule Take 1 capsule (50,000 Units total) by mouth every 7 (seven) days. 30 capsule 2     Review of Systems - Negative except for what is mentioned in HPI.  Physical Exam   Blood pressure 108/65, pulse (!) 102, temperature 98.9 F (37.2 C), temperature source Oral, resp. rate 18, last menstrual period 03/18/2016, not currently breastfeeding.  Constitutional: Well-developed,  well-nourished female in no acute distress.  HEENT: [place]/AT, sclerae anicteric. Normal oral mucosa, MMM Cardiovascular: normal rate, regular rythm Respiratory: normal effort, lungs CTAB.  GI: Abd soft, mild TTT on LLQ, no rebound or guarding +BS. Gravid appropriate for gestational age.   Pelvic: NEFG, thick white vaginal discharge, no blood. No CMT.  SVE:  Dilation: Closed Effacement (%): Thick Exam by:: Dr. Nira Retort MSK: Extremities nontender, no edema, normal ROM Neurologic: Alert and oriented x 4. Psych: Normal mood and affect   FHT:  Baseline 150 , moderate variability, accelerations present, no decelerations Toco: mild UI  MAU Course  Procedures  MDM Patient  seen and examined, nontoxic appearing, appear well-hydrate. Physical exam as above Wet prep and GC/Chlamydia collected.   Labs reviewed: Results for orders placed or performed during the hospital encounter of 10/14/16  Wet prep, genital  Result Value Ref Range   Yeast Wet Prep HPF POC NONE SEEN NONE SEEN   Trich, Wet Prep NONE SEEN NONE SEEN   Clue Cells Wet Prep HPF POC NONE SEEN NONE SEEN   WBC, Wet Prep HPF POC MANY (A) NONE SEEN   Sperm NONE SEEN   Urinalysis, Routine w reflex microscopic  Result Value Ref Range   Color, Urine YELLOW YELLOW   APPearance HAZY (A) CLEAR   Specific Gravity, Urine 1.025 1.005 - 1.030   pH 5.0 5.0 - 8.0   Glucose, UA NEGATIVE NEGATIVE mg/dL   Hgb urine dipstick NEGATIVE NEGATIVE   Bilirubin Urine NEGATIVE NEGATIVE   Ketones, ur 80 (A) NEGATIVE mg/dL   Protein, ur 30 (A) NEGATIVE mg/dL   Nitrite NEGATIVE NEGATIVE   Leukocytes, UA NEGATIVE NEGATIVE   RBC / HPF 0-5 0 - 5 RBC/hpf   WBC, UA 0-5 0 - 5 WBC/hpf   Bacteria, UA RARE (A) NONE SEEN   Squamous Epithelial / LPF 6-30 (A) NONE SEEN   Mucus PRESENT   CBC  Result Value Ref Range   WBC 8.5 4.0 - 10.5 K/uL   RBC 3.62 (L) 3.87 - 5.11 MIL/uL   Hemoglobin 10.7 (L) 12.0 - 15.0 g/dL   HCT 82.9 (L) 56.2 - 13.0 %   MCV 89.0 78.0 - 100.0 fL   MCH 29.6 26.0 - 34.0 pg   MCHC 33.2 30.0 - 36.0 g/dL   RDW 86.5 78.4 - 69.6 %   Platelets 170 150 - 400 K/uL  Comprehensive metabolic panel  Result Value Ref Range   Sodium 138 135 - 145 mmol/L   Potassium 4.0 3.5 - 5.1 mmol/L   Chloride 108 101 - 111 mmol/L   CO2 24 22 - 32 mmol/L   Glucose, Bld 106 (H) 65 - 99 mg/dL   BUN 8 6 - 20 mg/dL   Creatinine, Ser 2.95 0.44 - 1.00 mg/dL   Calcium 8.8 (L) 8.9 - 10.3 mg/dL   Total Protein 6.9 6.5 - 8.1 g/dL   Albumin 3.3 (L) 3.5 - 5.0 g/dL   AST 16 15 - 41 U/L   ALT 11 (L) 14 - 54 U/L   Alkaline Phosphatase 96 38 - 126 U/L   Total Bilirubin 0.9 0.3 - 1.2 mg/dL   GFR calc non Af Amer >60 >60 mL/min   GFR calc  Af Amer >60 >60 mL/min   Anion gap 6 5 - 15  Lipase, blood  Result Value Ref Range   Lipase 27 11 - 51 U/L   Labs wnl. UA with ketones. Pt did not want to stay for IV fluids.  Tolerating po here  Assessment and Plan  Assessment: 1. Gastroenteritis   2. Vomiting pregnancy    26 y.o. [id number] with IUP at [redacted]w[redacted]d p/w N/V/D, likely viral gastroenteritis. Exam and labs reassuring. Tolerating po here. GC/Chlamydia culture sent given history.   Plan: --Rx for phenergan prn  --Educated on diet over the next 48 hours, handout given --Discussed return precautions, including inability to tolerate po, persistent vomiting/diarrhea, fever, as well at PTL signs/sx.  --Discharge home in stable condition.   Degele, Kandra Nicolas, MD 10/14/2016 5:21 PM

## 2016-10-14 NOTE — MAU Note (Signed)
Pt C/O pelvic & rectal pressure since last night.  Vomiting & diarrhea since Saturday, denies fever - has had more than 3 stools today.  Also LLQ pain that hurts into her L thigh started last night.  Denies bleeding or LOF.

## 2016-10-15 ENCOUNTER — Ambulatory Visit (INDEPENDENT_AMBULATORY_CARE_PROVIDER_SITE_OTHER): Payer: Medicaid Other | Admitting: Certified Nurse Midwife

## 2016-10-15 ENCOUNTER — Encounter: Payer: Self-pay | Admitting: *Deleted

## 2016-10-15 ENCOUNTER — Encounter: Payer: Self-pay | Admitting: Certified Nurse Midwife

## 2016-10-15 VITALS — BP 110/71 | HR 93 | Wt 182.0 lb

## 2016-10-15 DIAGNOSIS — O219 Vomiting of pregnancy, unspecified: Secondary | ICD-10-CM

## 2016-10-15 DIAGNOSIS — R87612 Low grade squamous intraepithelial lesion on cytologic smear of cervix (LGSIL): Secondary | ICD-10-CM

## 2016-10-15 DIAGNOSIS — Z3483 Encounter for supervision of other normal pregnancy, third trimester: Secondary | ICD-10-CM

## 2016-10-15 DIAGNOSIS — R7989 Other specified abnormal findings of blood chemistry: Secondary | ICD-10-CM

## 2016-10-15 DIAGNOSIS — Z3480 Encounter for supervision of other normal pregnancy, unspecified trimester: Secondary | ICD-10-CM

## 2016-10-15 LAB — GC/CHLAMYDIA PROBE AMP (~~LOC~~) NOT AT ARMC
Chlamydia: NEGATIVE
Neisseria Gonorrhea: NEGATIVE

## 2016-10-15 MED ORDER — ONDANSETRON HCL 8 MG PO TABS: 8.0000 mg | ORAL_TABLET | Freq: Three times a day (TID) | ORAL | 2 refills | Status: DC | PRN

## 2016-10-15 NOTE — Progress Notes (Signed)
Pt states she was seen at [facility] yesterday, no help with symptoms. Pt states that she has been having back pain that radiates down left side. Pt complaints of increase pelvic pressure and vomiting. Pt has had some cramping over the weekend.

## 2016-10-15 NOTE — Progress Notes (Signed)
   PRENATAL VISIT NOTE  Subjective:  Chelsea Wallace is a 26 y.o. G3P2002 at 107w1d being seen today for ongoing prenatal care.  She is currently monitored for the following issues for this low-risk pregnancy and has Chlamydia infection affecting pregnancy in second trimester; Supervision of normal pregnancy, antepartum; Late prenatal care; Low vitamin D level; and LGSIL on Pap smear of cervix on her problem list.  Patient reports backache, nausea, no bleeding, no contractions, no cramping, no leaking, vomiting and diarrhea; since Sunday.  OTC Immodium/Rx for Zofran.  Out of work note completed.  Has not been wearing abdominal support belt. .  Contractions: Irritability. Vag. Bleeding: None.  Movement: Present. Denies leaking of fluid.   The following portions of the patient's history were reviewed and updated as appropriate: allergies, current medications, past family history, past medical history, past social history, past surgical history and problem list. Problem list updated.  Objective:   Vitals:   10/15/16 1359  BP: 110/71  Pulse: 93  Weight: 182 lb (82.6 kg)    Fetal Status: Fetal Heart Rate (bpm): 142; doppler Fundal Height: 30 cm Movement: Present     General:  Alert, oriented and cooperative. Patient is in no acute distress.  Skin: Skin is warm and dry. No rash noted.   Cardiovascular: Normal heart rate noted  Respiratory: Normal respiratory effort, no problems with respiration noted  Abdomen: Soft, gravid, appropriate for gestational age.  Pain/Pressure: Present     Pelvic: Cervical exam deferred        Extremities: Normal range of motion.     Mental Status:  Normal mood and affect. Normal behavior. Normal judgment and thought content.   Assessment and Plan:  Pregnancy: G3P2002 at [redacted]w[redacted]d  1. Nausea/vomiting in pregnancy      Gastritis dx 10/14/16. OTC Imodium for diarrhea.  - ondansetron (ZOFRAN) 8 MG tablet; Take 1 tablet (8 mg total) by mouth every 8 (eight) hours as  needed for nausea or vomiting.  Dispense: 40 tablet; Refill: 2  2. Supervision of other normal pregnancy, antepartum      Out of work note completed until patient is feeling better.  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  Discussed comfort measures for sciatica.  MAU note reviewed from 10/14/16: cervix was closed.  Encouraged to use maternity support belt.    3. Low vitamin D level     Taking weekly vitamin D.   4. LGSIL on Pap smear of cervix     Colpo postpartum  Preterm labor symptoms and general obstetric precautions including but not limited to vaginal bleeding, contractions, leaking of fluid and fetal movement were reviewed in detail with the patient. Please refer to After Visit Summary for other counseling recommendations.  Return in about 2 weeks (around 10/29/2016) for ROB.   Roe Coombs, CNM

## 2016-10-21 ENCOUNTER — Telehealth: Payer: Self-pay

## 2016-10-21 NOTE — Telephone Encounter (Signed)
Patient called stating that she needs a letter stating that she needs to go on early maternity leave. States that her job will not take any more letters. If she gets a letter indicating she needs to go on early maternity leave that will make her eligible for rehire 6 weeks postpartum. Letter written reviewed and signed by Boykin Reaper

## 2016-10-29 ENCOUNTER — Ambulatory Visit (INDEPENDENT_AMBULATORY_CARE_PROVIDER_SITE_OTHER): Payer: Medicaid Other | Admitting: Certified Nurse Midwife

## 2016-10-29 VITALS — BP 96/66 | HR 98 | Wt 185.0 lb

## 2016-10-29 DIAGNOSIS — R7989 Other specified abnormal findings of blood chemistry: Secondary | ICD-10-CM

## 2016-10-29 DIAGNOSIS — Z3480 Encounter for supervision of other normal pregnancy, unspecified trimester: Secondary | ICD-10-CM

## 2016-10-29 DIAGNOSIS — Z3483 Encounter for supervision of other normal pregnancy, third trimester: Secondary | ICD-10-CM

## 2016-10-29 NOTE — Progress Notes (Signed)
Patient reports good fetal movement, denies pain. 

## 2016-10-29 NOTE — Progress Notes (Signed)
   PRENATAL VISIT NOTE  Subjective:  Chelsea Wallace is a 26 y.o. G3P2002 at [redacted]w[redacted]d being seen today for ongoing prenatal care.  She is currently monitored for the following issues for this low-risk pregnancy and has Chlamydia infection affecting pregnancy in second trimester; Supervision of normal pregnancy, antepartum; Late prenatal care; Low vitamin D level; and LGSIL on Pap smear of cervix on her problem list.  Patient reports no complaints.  Contractions: Not present. Vag. Bleeding: None.  Movement: Present. Denies leaking of fluid.   The following portions of the patient's history were reviewed and updated as appropriate: allergies, current medications, past family history, past medical history, past social history, past surgical history and problem list. Problem list updated.  Objective:   Vitals:   10/29/16 1443  BP: 96/66  Pulse: 98  Weight: 185 lb (83.9 kg)    Fetal Status: Fetal Heart Rate (bpm): 154; doppler Fundal Height: 32 cm Movement: Present     General:  Alert, oriented and cooperative. Patient is in no acute distress.  Skin: Skin is warm and dry. No rash noted.   Cardiovascular: Normal heart rate noted  Respiratory: Normal respiratory effort, no problems with respiration noted  Abdomen: Soft, gravid, appropriate for gestational age.  Pain/Pressure: Absent     Pelvic: Cervical exam deferred        Extremities: Normal range of motion.  Edema: None  Mental Status:  Normal mood and affect. Normal behavior. Normal judgment and thought content.   Assessment and Plan:  Pregnancy: G3P2002 at [redacted]w[redacted]d  1. Supervision of other normal pregnancy, antepartum     Doing well.    2. Low vitamin D level     Taking weekly vitamin D  Preterm labor symptoms and general obstetric precautions including but not limited to vaginal bleeding, contractions, leaking of fluid and fetal movement were reviewed in detail with the patient. Please refer to After Visit Summary for other  counseling recommendations.  Return in about 2 weeks (around 11/12/2016) for ROB.   Roe Coombs, CNM

## 2016-11-05 ENCOUNTER — Inpatient Hospital Stay (HOSPITAL_COMMUNITY): Payer: Medicaid Other

## 2016-11-05 ENCOUNTER — Encounter (HOSPITAL_COMMUNITY): Payer: Self-pay | Admitting: Emergency Medicine

## 2016-11-05 ENCOUNTER — Observation Stay (HOSPITAL_COMMUNITY)
Admission: EM | Admit: 2016-11-05 | Discharge: 2016-11-06 | Disposition: A | Discharge status: Home / Self Care | Payer: Medicaid Other | Attending: Family Medicine | Admitting: Family Medicine

## 2016-11-05 DIAGNOSIS — T1490XA Injury, unspecified, initial encounter: Secondary | ICD-10-CM | POA: Diagnosis not present

## 2016-11-05 DIAGNOSIS — Z3A33 33 weeks gestation of pregnancy: Secondary | ICD-10-CM

## 2016-11-05 DIAGNOSIS — O9A213 Injury, poisoning and certain other consequences of external causes complicating pregnancy, third trimester: Secondary | ICD-10-CM

## 2016-11-05 DIAGNOSIS — O26899 Other specified pregnancy related conditions, unspecified trimester: Secondary | ICD-10-CM

## 2016-11-05 DIAGNOSIS — O471 False labor at or after 37 completed weeks of gestation: Principal | ICD-10-CM | POA: Insufficient documentation

## 2016-11-05 DIAGNOSIS — O26893 Other specified pregnancy related conditions, third trimester: Secondary | ICD-10-CM

## 2016-11-05 DIAGNOSIS — Z23 Encounter for immunization: Secondary | ICD-10-CM | POA: Insufficient documentation

## 2016-11-05 DIAGNOSIS — R109 Unspecified abdominal pain: Secondary | ICD-10-CM

## 2016-11-05 LAB — URINALYSIS, ROUTINE W REFLEX MICROSCOPIC
Bilirubin Urine: NEGATIVE
GLUCOSE, UA: NEGATIVE mg/dL
Hgb urine dipstick: NEGATIVE
KETONES UR: NEGATIVE mg/dL
Nitrite: NEGATIVE
PH: 7 (ref 5.0–8.0)
PROTEIN: NEGATIVE mg/dL
SPECIFIC GRAVITY, URINE: 1.005 (ref 1.005–1.030)

## 2016-11-05 LAB — COMPREHENSIVE METABOLIC PANEL
ALBUMIN: 3.4 g/dL — AB (ref 3.5–5.0)
ALK PHOS: 165 U/L — AB (ref 38–126)
ALT: 11 U/L — ABNORMAL LOW (ref 14–54)
ANION GAP: 10 (ref 5–15)
AST: 24 U/L (ref 15–41)
BILIRUBIN TOTAL: 0.8 mg/dL (ref 0.3–1.2)
BUN: 6 mg/dL (ref 6–20)
CO2: 18 mmol/L — AB (ref 22–32)
Calcium: 8.7 mg/dL — ABNORMAL LOW (ref 8.9–10.3)
Chloride: 107 mmol/L (ref 101–111)
Creatinine, Ser: 0.6 mg/dL (ref 0.44–1.00)
GFR calc Af Amer: >60 mL/min (ref >60)
GFR calc non Af Amer: >60 mL/min (ref >60)
GLUCOSE: 114 mg/dL — AB (ref 65–99)
POTASSIUM: 3.4 mmol/L — AB (ref 3.5–5.1)
SODIUM: 135 mmol/L (ref 135–145)
TOTAL PROTEIN: 6.6 g/dL (ref 6.5–8.1)

## 2016-11-05 LAB — CBC
HEMATOCRIT: 33.6 % — AB (ref 36.0–46.0)
HEMOGLOBIN: 10.9 g/dL — AB (ref 12.0–15.0)
MCH: 28.9 pg (ref 26.0–34.0)
MCHC: 32.4 g/dL (ref 30.0–36.0)
MCV: 89.1 fL (ref 78.0–100.0)
Platelets: 168 10*3/uL (ref 150–400)
RBC: 3.77 MIL/uL — ABNORMAL LOW (ref 3.87–5.11)
RDW: 13.4 % (ref 11.5–15.5)
WBC: 7.8 10*3/uL (ref 4.0–10.5)

## 2016-11-05 LAB — KLEIHAUER-BETKE STAIN
# VIALS RHIG: 3
Fetal Cells %: 1.2 %
QUANTITATION FETAL HEMOGLOBIN: 60 mL

## 2016-11-05 LAB — ABO/RH: ABO/RH(D): O POS

## 2016-11-05 LAB — AMNISURE RUPTURE OF MEMBRANE (ROM) NOT AT ARMC: AMNISURE: NEGATIVE

## 2016-11-05 LAB — POCT FERN TEST: POCT Fern Test: NEGATIVE

## 2016-11-05 MED ORDER — ACETAMINOPHEN 325 MG PO TABS
650.0000 mg | ORAL_TABLET | ORAL | Status: DC | PRN
  Administered 2016-11-05 (×2): 650 mg via ORAL
  Filled 2016-11-05 (×2): qty 2

## 2016-11-05 MED ORDER — LACTATED RINGERS IV BOLUS (SEPSIS)
1000.0000 mL | Freq: Once | INTRAVENOUS | Status: AC
  Administered 2016-11-05: 1000 mL via INTRAVENOUS

## 2016-11-05 MED ORDER — CALCIUM CARBONATE ANTACID 500 MG PO CHEW: 2.0000 | CHEWABLE_TABLET | ORAL | Status: DC | PRN

## 2016-11-05 MED ORDER — INFLUENZA VAC SPLIT QUAD 0.5 ML IM SUSY
0.5000 mL | PREFILLED_SYRINGE | INTRAMUSCULAR | Status: AC
  Administered 2016-11-06: 0.5 mL via INTRAMUSCULAR
  Filled 2016-11-05: qty 0.5

## 2016-11-05 MED ORDER — ZOLPIDEM TARTRATE 5 MG PO TABS: 5.0000 mg | ORAL_TABLET | Freq: Every evening | ORAL | Status: DC | PRN

## 2016-11-05 MED ORDER — DOCUSATE SODIUM 100 MG PO CAPS
100.0000 mg | ORAL_CAPSULE | Freq: Every day | ORAL | Status: DC
  Administered 2016-11-06: 100 mg via ORAL
  Filled 2016-11-05: qty 1

## 2016-11-05 MED ORDER — PRENATAL MULTIVITAMIN CH
1.0000 | ORAL_TABLET | Freq: Every day | ORAL | Status: DC
  Administered 2016-11-06: 1 via ORAL
  Filled 2016-11-05: qty 1
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
## 2016-11-05 NOTE — Progress Notes (Signed)
Orthopedic Tech Progress Note Patient Details:  Chelsea Wallace 1990-01-22 [id number]  [name], female   DOB: 1990-01-22, 26 y.o.   MRN: [id number]   Nikki DomCrawford, Chanin Frumkin 11/05/2016, 2:30 PM Made level 2 trauma visit
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
## 2016-11-05 NOTE — ED Notes (Signed)
Rapid OB RN at bedside.  
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
## 2016-11-06 DIAGNOSIS — R109 Unspecified abdominal pain: Secondary | ICD-10-CM | POA: Diagnosis not present

## 2016-11-06 DIAGNOSIS — O26893 Other specified pregnancy related conditions, third trimester: Secondary | ICD-10-CM | POA: Diagnosis not present

## 2016-11-06 DIAGNOSIS — Z3A33 33 weeks gestation of pregnancy: Secondary | ICD-10-CM | POA: Diagnosis not present

## 2016-11-06 DIAGNOSIS — T1490XA Injury, unspecified, initial encounter: Secondary | ICD-10-CM | POA: Diagnosis not present

## 2016-11-06 DIAGNOSIS — O9A213 Injury, poisoning and certain other consequences of external causes complicating pregnancy, third trimester: Secondary | ICD-10-CM | POA: Diagnosis not present

## 2016-11-06 LAB — TYPE AND SCREEN
ABO/RH(D): O POS
ANTIBODY SCREEN: NEGATIVE

## 2016-11-06 MED ORDER — CYCLOBENZAPRINE HCL 10 MG PO TABS
10.0000 mg | ORAL_TABLET | Freq: Three times a day (TID) | ORAL | Status: DC | PRN
  Administered 2016-11-06: 10 mg via ORAL
  Filled 2016-11-06 (×2): qty 1

## 2016-11-06 MED ORDER — OXYCODONE HCL 5 MG PO TABS
5.0000 mg | ORAL_TABLET | Freq: Once | ORAL | Status: AC
  Administered 2016-11-06: 5 mg via ORAL
  Filled 2016-11-06: qty 1

## 2016-11-06 MED ORDER — CYCLOBENZAPRINE HCL 10 MG PO TABS: 10.0000 mg | ORAL_TABLET | Freq: Three times a day (TID) | ORAL | 0 refills | Status: DC | PRN

## 2016-11-06 NOTE — Progress Notes (Signed)
Discharge instructions given and patient verbalized understanding of all instructions given.Written copy of AVS given to patient. 
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
## 2016-11-06 NOTE — Progress Notes (Signed)
Pt stated she is feeling ctx again, rating pain 8/10. Feeling pain in lower abdomen and radiating to back. Encouraged pt to go empty her bladder. Will continue to monitor.
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
## 2016-11-07 ENCOUNTER — Encounter: Payer: Self-pay | Admitting: Certified Nurse Midwife

## 2016-11-12 ENCOUNTER — Ambulatory Visit (INDEPENDENT_AMBULATORY_CARE_PROVIDER_SITE_OTHER): Payer: Medicaid Other | Admitting: Certified Nurse Midwife

## 2016-11-12 VITALS — BP 119/76 | HR 101 | Wt 184.6 lb

## 2016-11-12 DIAGNOSIS — O0930 Supervision of pregnancy with insufficient antenatal care, unspecified trimester: Secondary | ICD-10-CM

## 2016-11-12 DIAGNOSIS — Z3480 Encounter for supervision of other normal pregnancy, unspecified trimester: Secondary | ICD-10-CM

## 2016-11-12 DIAGNOSIS — R7989 Other specified abnormal findings of blood chemistry: Secondary | ICD-10-CM
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
## 2016-11-19 ENCOUNTER — Other Ambulatory Visit (HOSPITAL_COMMUNITY)
Admission: RE | Admit: 2016-11-19 | Discharge: 2016-11-19 | Disposition: A | Discharge status: Home / Self Care | Payer: Medicaid Other | Source: Ambulatory Visit | Attending: Certified Nurse Midwife | Admitting: Certified Nurse Midwife

## 2016-11-19 ENCOUNTER — Ambulatory Visit (INDEPENDENT_AMBULATORY_CARE_PROVIDER_SITE_OTHER): Payer: Medicaid Other | Admitting: Certified Nurse Midwife

## 2016-11-19 VITALS — BP 116/73 | HR 109 | Wt 185.0 lb

## 2016-11-19 DIAGNOSIS — K219 Gastro-esophageal reflux disease without esophagitis: Secondary | ICD-10-CM

## 2016-11-19 DIAGNOSIS — Z3483 Encounter for supervision of other normal pregnancy, third trimester: Secondary | ICD-10-CM | POA: Insufficient documentation

## 2016-11-19 DIAGNOSIS — Z3480 Encounter for supervision of other normal pregnancy, unspecified trimester: Secondary | ICD-10-CM

## 2016-11-19 DIAGNOSIS — Z3A35 35 weeks gestation of pregnancy: Secondary | ICD-10-CM | POA: Insufficient documentation

## 2016-11-19 DIAGNOSIS — R7989 Other specified abnormal findings of blood chemistry: Secondary | ICD-10-CM

## 2016-11-19 DIAGNOSIS — O99613 Diseases of the digestive system complicating pregnancy, third trimester: Secondary | ICD-10-CM

## 2016-11-19 MED ORDER — PANTOPRAZOLE SODIUM 40 MG PO TBEC: 40.0000 mg | DELAYED_RELEASE_TABLET | Freq: Two times a day (BID) | ORAL | 1 refills | Status: DC

## 2016-11-19 NOTE — Progress Notes (Signed)
Patient reports good fetal movement with pressure, denies contractions. 
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
## 2016-11-20 LAB — CERVICOVAGINAL ANCILLARY ONLY
BACTERIAL VAGINITIS: NEGATIVE
CANDIDA VAGINITIS: POSITIVE — AB
Chlamydia: NEGATIVE
Neisseria Gonorrhea: NEGATIVE
Trichomonas: NEGATIVE

## 2016-11-21 ENCOUNTER — Other Ambulatory Visit: Payer: Self-pay | Admitting: Certified Nurse Midwife

## 2016-11-21 DIAGNOSIS — Z3480 Encounter for supervision of other normal pregnancy, unspecified trimester: Secondary | ICD-10-CM

## 2016-11-21 DIAGNOSIS — B3731 Acute candidiasis of vulva and vagina: Secondary | ICD-10-CM

## 2016-11-21 DIAGNOSIS — B951 Streptococcus, group B, as the cause of diseases classified elsewhere: Secondary | ICD-10-CM

## 2016-11-21 DIAGNOSIS — B373 Candidiasis of vulva and vagina: Secondary | ICD-10-CM

## 2016-11-21 LAB — STREP GP B NAA: STREP GROUP B AG: POSITIVE — AB

## 2016-11-21 MED ORDER — TERCONAZOLE 0.8 % VA CREA: 1.0000 | TOPICAL_CREAM | Freq: Every day | VAGINAL | 0 refills | Status: DC

## 2016-11-21 MED ORDER — FLUCONAZOLE 150 MG PO TABS: 150.0000 mg | ORAL_TABLET | Freq: Once | ORAL | 0 refills | Status: AC

## 2016-11-26 ENCOUNTER — Other Ambulatory Visit (HOSPITAL_COMMUNITY)
Admission: RE | Admit: 2016-11-26 | Discharge: 2016-11-26 | Disposition: A | Discharge status: Home / Self Care | Payer: Medicaid Other | Source: Ambulatory Visit | Attending: Certified Nurse Midwife | Admitting: Certified Nurse Midwife

## 2016-11-26 ENCOUNTER — Ambulatory Visit (INDEPENDENT_AMBULATORY_CARE_PROVIDER_SITE_OTHER): Payer: Medicaid Other | Admitting: Certified Nurse Midwife

## 2016-11-26 ENCOUNTER — Other Ambulatory Visit: Payer: Self-pay

## 2016-11-26 VITALS — BP 118/77 | HR 95 | Wt 187.0 lb

## 2016-11-26 DIAGNOSIS — R7989 Other specified abnormal findings of blood chemistry: Secondary | ICD-10-CM

## 2016-11-26 DIAGNOSIS — Z3480 Encounter for supervision of other normal pregnancy, unspecified trimester: Secondary | ICD-10-CM

## 2016-11-26 DIAGNOSIS — O99619 Diseases of the digestive system complicating pregnancy, unspecified trimester: Secondary | ICD-10-CM

## 2016-11-26 DIAGNOSIS — B951 Streptococcus, group B, as the cause of diseases classified elsewhere: Secondary | ICD-10-CM

## 2016-11-26 DIAGNOSIS — Z3483 Encounter for supervision of other normal pregnancy, third trimester: Secondary | ICD-10-CM | POA: Insufficient documentation

## 2016-11-26 DIAGNOSIS — K219 Gastro-esophageal reflux disease without esophagitis: Secondary | ICD-10-CM

## 2016-11-26 MED ORDER — OMEPRAZOLE 20 MG PO CPDR: 20.0000 mg | DELAYED_RELEASE_CAPSULE | Freq: Two times a day (BID) | ORAL | 5 refills | Status: DC
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
## 2016-11-27 LAB — CERVICOVAGINAL ANCILLARY ONLY
Bacterial vaginitis: NEGATIVE
Candida vaginitis: POSITIVE — AB
Chlamydia: NEGATIVE
Neisseria Gonorrhea: NEGATIVE
Trichomonas: NEGATIVE

## 2016-11-28 ENCOUNTER — Other Ambulatory Visit: Payer: Self-pay | Admitting: Certified Nurse Midwife

## 2016-11-28 DIAGNOSIS — B3731 Acute candidiasis of vulva and vagina: Secondary | ICD-10-CM

## 2016-11-28 DIAGNOSIS — B373 Candidiasis of vulva and vagina: Secondary | ICD-10-CM

## 2016-11-28 MED ORDER — TERCONAZOLE 0.8 % VA CREA: 1.0000 | TOPICAL_CREAM | Freq: Every day | VAGINAL | 0 refills | Status: DC

## 2016-11-28 MED ORDER — FLUCONAZOLE 150 MG PO TABS: 150.0000 mg | ORAL_TABLET | Freq: Once | ORAL | 0 refills | Status: AC

## 2016-12-03 ENCOUNTER — Encounter: Payer: Self-pay | Admitting: Obstetrics and Gynecology

## 2016-12-03 ENCOUNTER — Ambulatory Visit (INDEPENDENT_AMBULATORY_CARE_PROVIDER_SITE_OTHER): Payer: Medicaid Other | Admitting: Obstetrics and Gynecology

## 2016-12-03 VITALS — BP 117/76 | HR 106 | Wt 185.2 lb

## 2016-12-03 DIAGNOSIS — Z3480 Encounter for supervision of other normal pregnancy, unspecified trimester: Secondary | ICD-10-CM

## 2016-12-03 DIAGNOSIS — B951 Streptococcus, group B, as the cause of diseases classified elsewhere: Secondary | ICD-10-CM

## 2016-12-03 NOTE — Patient Instructions (Signed)
Vaginal Delivery Vaginal delivery means that you will give birth by pushing your baby out of your birth canal (vagina). A team of health care providers will help you before, during, and after vaginal delivery. Birth experiences are unique for every woman and every pregnancy, and birth experiences vary depending on where you choose to give birth. What should I do to prepare for my baby's birth? Before your baby is born, it is important to talk with your health care provider about:  Your labor and delivery preferences. These may include: ? Medicines that you may be given. ? How you will manage your pain. This might include non-medical pain relief techniques or injectable pain relief such as epidural analgesia. ? How you and your baby will be monitored during labor and delivery. ? Who may be in the labor and delivery room with you. ? Your feelings about surgical delivery of your baby (cesarean delivery, or C-section) if this becomes necessary. ? Your feelings about receiving donated blood through an IV tube (blood transfusion) if this becomes necessary.  Whether you are able: ? To take pictures or videos of the birth. ? To eat during labor and delivery. ? To move around, walk, or change positions during labor and delivery.  What to expect after your baby is born, such as: ? Whether delayed umbilical cord clamping and cutting is offered. ? Who will care for your baby right after birth. ? Medicines or tests that may be recommended for your baby. ? Whether breastfeeding is supported in your hospital or birth center. ? How long you will be in the hospital or birth center.  How any medical conditions you have may affect your baby or your labor and delivery experience.  To prepare for your baby's birth, you should also:  Attend all of your health care visits before delivery (prenatal visits) as recommended by your health care provider. This is important.  Prepare your home for your baby's  arrival. Make sure that you have: ? Diapers. ? Baby clothing. ? Feeding equipment. ? Safe sleeping arrangements for you and your baby.  Install a car seat in your vehicle. Have your car seat checked by a certified car seat installer to make sure that it is installed safely.  Think about who will help you with your new baby at home for at least the first several weeks after delivery.  What can I expect when I arrive at the birth center or hospital? Once you are in labor and have been admitted into the hospital or birth center, your health care provider may:  Review your pregnancy history and any concerns you have.  Insert an IV tube into one of your veins. This is used to give you fluids and medicines.  Check your blood pressure, pulse, temperature, and heart rate (vital signs).  Check whether your bag of water (amniotic sac) has broken (ruptured).  Talk with you about your birth plan and discuss pain control options.  Monitoring Your health care provider may monitor your contractions (uterine monitoring) and your baby's heart rate (fetal monitoring). You may need to be monitored:  Often, but not continuously (intermittently).  All the time or for long periods at a time (continuously). Continuous monitoring may be needed if: ? You are taking certain medicines, such as medicine to relieve pain or make your contractions stronger. ? You have pregnancy or labor complications.  Monitoring may be done by:  Placing a special stethoscope or a handheld monitoring device on your abdomen to   check your baby's heartbeat, and feeling your abdomen for contractions. This method of monitoring does not continuously record your baby's heartbeat or your contractions.  Placing monitors on your abdomen (external monitors) to record your baby's heartbeat and the frequency and length of contractions. You may not have to wear external monitors all the time.  Placing monitors inside of your uterus  (internal monitors) to record your baby's heartbeat and the frequency, length, and strength of your contractions. ? Your health care provider may use internal monitors if he or she needs more information about the strength of your contractions or your baby's heart rate. ? Internal monitors are put in place by passing a thin, flexible wire through your vagina and into your uterus. Depending on the type of monitor, it may remain in your uterus or on your baby's head until birth. ? Your health care provider will discuss the benefits and risks of internal monitoring with you and will ask for your permission before inserting the monitors.  Telemetry. This is a type of continuous monitoring that can be done with external or internal monitors. Instead of having to stay in bed, you are able to move around during telemetry. Ask your health care provider if telemetry is an option for you.  Physical exam Your health care provider may perform a physical exam. This may include:  Checking whether your baby is positioned: ? With the head toward your vagina (head-down). This is most common. ? With the head toward the top of your uterus (head-up or breech). If your baby is in a breech position, your health care provider may try to turn your baby to a head-down position so you can deliver vaginally. If it does not seem that your baby can be born vaginally, your provider may recommend surgery to deliver your baby. In rare cases, you may be able to deliver vaginally if your baby is head-up (breech delivery). ? Lying sideways (transverse). Babies that are lying sideways cannot be delivered vaginally.  Checking your cervix to determine: ? Whether it is thinning out (effacing). ? Whether it is opening up (dilating). ? How low your baby has moved into your birth canal.  What are the three stages of labor and delivery?  Normal labor and delivery is divided into the following three stages: Stage 1  Stage 1 is the  longest stage of labor, and it can last for hours or days. Stage 1 includes: ? Early labor. This is when contractions may be irregular, or regular and mild. Generally, early labor contractions are more than 10 minutes apart. ? Active labor. This is when contractions get longer, more regular, more frequent, and more intense. ? The transition phase. This is when contractions happen very close together, are very intense, and may last longer than during any other part of labor.  Contractions generally feel mild, infrequent, and irregular at first. They get stronger, more frequent (about every 2-3 minutes), and more regular as you progress from early labor through active labor and transition.  Many women progress through stage 1 naturally, but you may need help to continue making progress. If this happens, your health care provider may talk with you about: ? Rupturing your amniotic sac if it has not ruptured yet. ? Giving you medicine to help make your contractions stronger and more frequent.  Stage 1 ends when your cervix is completely dilated to 4 inches (10 cm) and completely effaced. This happens at the end of the transition phase. Stage 2  Once   your cervix is completely effaced and dilated to 4 inches (10 cm), you may start to feel an urge to push. It is common for the body to naturally take a rest before feeling the urge to push, especially if you received an epidural or certain other pain medicines. This rest period may last for up to 1-2 hours, depending on your unique labor experience.  During stage 2, contractions are generally less painful, because pushing helps relieve contraction pain. Instead of contraction pain, you may feel stretching and burning pain, especially when the widest part of your baby's head passes through the vaginal opening (crowning).  Your health care provider will closely monitor your pushing progress and your baby's progress through the vagina during stage 2.  Your  health care provider may massage the area of skin between your vaginal opening and anus (perineum) or apply warm compresses to your perineum. This helps it stretch as the baby's head starts to crown, which can help prevent perineal tearing. ? In some cases, an incision may be made in your perineum (episiotomy) to allow the baby to pass through the vaginal opening. An episiotomy helps to make the opening of the vagina larger to allow more room for the baby to fit through.  It is very important to breathe and focus so your health care provider can control the delivery of your baby's head. Your health care provider may have you decrease the intensity of your pushing, to help prevent perineal tearing.  After delivery of your baby's head, the shoulders and the rest of the body generally deliver very quickly and without difficulty.  Once your baby is delivered, the umbilical cord may be cut right away, or this may be delayed for 1-2 minutes, depending on your baby's health. This may vary among health care providers, hospitals, and birth centers.  If you and your baby are healthy enough, your baby may be placed on your chest or abdomen to help maintain the baby's temperature and to help you bond with each other. Some mothers and babies start breastfeeding at this time. Your health care team will dry your baby and help keep your baby warm during this time.  Your baby may need immediate care if he or she: ? Showed signs of distress during labor. ? Has a medical condition. ? Was born too early (prematurely). ? Had a bowel movement before birth (meconium). ? Shows signs of difficulty transitioning from being inside the uterus to being outside of the uterus. If you are planning to breastfeed, your health care team will help you begin a feeding. Stage 3  The third stage of labor starts immediately after the birth of your baby and ends after you deliver the placenta. The placenta is an organ that develops  during pregnancy to provide oxygen and nutrients to your baby in the womb.  Delivering the placenta may require some pushing, and you may have mild contractions. Breastfeeding can stimulate contractions to help you deliver the placenta.  After the placenta is delivered, your uterus should tighten (contract) and become firm. This helps to stop bleeding in your uterus. To help your uterus contract and to control bleeding, your health care provider may: ? Give you medicine by injection, through an IV tube, by mouth, or through your rectum (rectally). ? Massage your abdomen or perform a vaginal exam to remove any blood clots that are left in your uterus. ? Empty your bladder by placing a thin, flexible tube (catheter) into your bladder. ? Encourage   you to breastfeed your baby. After labor is over, you and your baby will be monitored closely to ensure that you are both healthy until you are ready to go home. Your health care team will teach you how to care for yourself and your baby. This information is not intended to replace advice given to you by your health care provider. Make sure you discuss any questions you have with your health care provider. Document Released: 10/10/2007 Document Revised: 07/21/2015 Document Reviewed: 01/15/2015 Elsevier Interactive Patient Education  2018 [employer]  

## 2016-12-03 NOTE — Progress Notes (Signed)
Patient reports good fetal movement with pressure and contractions that come and go. 

## 2016-12-03 NOTE — Progress Notes (Signed)
Subjective:  Chelsea Wallace is a 26 y.o. G3P2002 at 7250w1d being seen today for ongoing prenatal care.  She is currently monitored for the following issues for this low-risk pregnancy and has Supervision of normal pregnancy, antepartum; Late prenatal care; Low vitamin D level; LGSIL on Pap smear of cervix; and Positive GBS test on their problem list.  Patient reports general discomforts of pregnancy and URI Sx.  Contractions: Irregular. Vag. Bleeding: None.  Movement: Present. Denies leaking of fluid.   The following portions of the patient's history were reviewed and updated as appropriate: allergies, current medications, past family history, past medical history, past social history, past surgical history and problem list. Problem list updated.  Objective:   Vitals:   12/03/16 1427  BP: 117/76  Pulse: (!) 106  Weight: 185 lb 3.2 oz (84 kg)    Fetal Status: Fetal Heart Rate (bpm): 147   Movement: Present     General:  Alert, oriented and cooperative. Patient is in no acute distress.  Skin: Skin is warm and dry. No rash noted.   Cardiovascular: Normal heart rate noted  Respiratory: Normal respiratory effort, no problems with respiration noted  Abdomen: Soft, gravid, appropriate for gestational age. Pain/Pressure: Present     Pelvic:  Cervical exam deferred        Extremities: Normal range of motion.  Edema: None  Mental Status: Normal mood and affect. Normal behavior. Normal judgment and thought content.   Urinalysis:      Assessment and Plan:  Pregnancy: G3P2002 at 550w1d  1. Supervision of other normal pregnancy, antepartum Stable Labor precautions Safe Med list provided to pt  2. Positive GBS test Tx while in labor  Term labor symptoms and general obstetric precautions including but not limited to vaginal bleeding, contractions, leaking of fluid and fetal movement were reviewed in detail with the patient. Please refer to After Visit Summary for other counseling  recommendations.  Return in about 1 week (around 12/10/2016) for OB visit.   Hermina StaggersErvin, Kaydee Magel L, MD

## 2016-12-10 ENCOUNTER — Ambulatory Visit (INDEPENDENT_AMBULATORY_CARE_PROVIDER_SITE_OTHER): Payer: Medicaid Other | Admitting: Certified Nurse Midwife

## 2016-12-10 ENCOUNTER — Encounter: Payer: Self-pay | Admitting: Certified Nurse Midwife

## 2016-12-10 VITALS — BP 127/80 | HR 108 | Wt 186.0 lb

## 2016-12-10 DIAGNOSIS — Z3480 Encounter for supervision of other normal pregnancy, unspecified trimester: Secondary | ICD-10-CM

## 2016-12-10 DIAGNOSIS — R7989 Other specified abnormal findings of blood chemistry: Secondary | ICD-10-CM

## 2016-12-10 DIAGNOSIS — B951 Streptococcus, group B, as the cause of diseases classified elsewhere: Secondary | ICD-10-CM

## 2016-12-10 NOTE — Progress Notes (Signed)
   PRENATAL VISIT NOTE  Subjective:  Chelsea Wallace is a 26 y.o. G3P2002 at 3754w1d being seen today for ongoing prenatal care.  She is currently monitored for the following issues for this low-risk pregnancy and has Supervision of normal pregnancy, antepartum; Late prenatal care; Low vitamin D level; LGSIL on Pap smear of cervix; and Positive GBS test on their problem list.  Patient reports no bleeding, no leaking and occasional contractions.  Contractions: Irregular. Vag. Bleeding: None.  Movement: Present. Denies leaking of fluid.   The following portions of the patient's history were reviewed and updated as appropriate: allergies, current medications, past family history, past medical history, past social history, past surgical history and problem list. Problem list updated.  Objective:   Vitals:   12/10/16 1453  BP: 127/80  Pulse: (!) 108  Weight: 186 lb (84.4 kg)    Fetal Status: Fetal Heart Rate (bpm): 145; doppler Fundal Height: 35 cm Movement: Present  Presentation: Vertex  General:  Alert, oriented and cooperative. Patient is in no acute distress.  Skin: Skin is warm and dry. No rash noted.   Cardiovascular: Normal heart rate noted  Respiratory: Normal respiratory effort, no problems with respiration noted  Abdomen: Soft, gravid, appropriate for gestational age.  Pain/Pressure: Present     Pelvic: Cervical exam performed Dilation: 2 Effacement (%): 50 Station: -3  Extremities: Normal range of motion.  Edema: None  Mental Status:  Normal mood and affect. Normal behavior. Normal judgment and thought content.   Assessment and Plan:  Pregnancy: G3P2002 at 2054w1d  1. Supervision of other normal pregnancy, antepartum      Doing well  2. Positive GBS test      PCN for labor/delivery  3. Low vitamin D level     Taking weekly vitamin D  Term labor symptoms and general obstetric precautions including but not limited to vaginal bleeding, contractions, leaking of fluid and  fetal movement were reviewed in detail with the patient. Please refer to After Visit Summary for other counseling recommendations.  Return in about 1 week (around 12/17/2016) for ROB, schedule IOL for 41 weeks.   Roe Coombsachelle A Adriana Lina, CNM

## 2016-12-10 NOTE — Progress Notes (Signed)
Pt states she has had increase in ctx since last night, today more frequent.

## 2016-12-11 ENCOUNTER — Encounter (HOSPITAL_COMMUNITY): Payer: Self-pay | Admitting: *Deleted

## 2016-12-11 ENCOUNTER — Inpatient Hospital Stay (HOSPITAL_COMMUNITY)
Admission: AD | Admit: 2016-12-11 | Discharge: 2016-12-13 | DRG: 807 | Disposition: A | Discharge status: Home / Self Care | Payer: Medicaid Other | Source: Ambulatory Visit | Attending: Obstetrics and Gynecology | Admitting: Obstetrics and Gynecology

## 2016-12-11 ENCOUNTER — Other Ambulatory Visit: Payer: Self-pay

## 2016-12-11 ENCOUNTER — Inpatient Hospital Stay (HOSPITAL_COMMUNITY)
Admission: AD | Admit: 2016-12-11 | Discharge: 2016-12-11 | Disposition: A | Discharge status: Home / Self Care | Payer: Medicaid Other | Source: Ambulatory Visit | Attending: Obstetrics & Gynecology | Admitting: Obstetrics & Gynecology

## 2016-12-11 DIAGNOSIS — Z3A38 38 weeks gestation of pregnancy: Secondary | ICD-10-CM | POA: Insufficient documentation

## 2016-12-11 DIAGNOSIS — O99824 Streptococcus B carrier state complicating childbirth: Principal | ICD-10-CM | POA: Diagnosis present

## 2016-12-11 DIAGNOSIS — O479 False labor, unspecified: Secondary | ICD-10-CM

## 2016-12-11 DIAGNOSIS — Z3483 Encounter for supervision of other normal pregnancy, third trimester: Secondary | ICD-10-CM | POA: Diagnosis present

## 2016-12-11 DIAGNOSIS — O471 False labor at or after 37 completed weeks of gestation: Secondary | ICD-10-CM | POA: Insufficient documentation

## 2016-12-11 LAB — CBC
HEMATOCRIT: 34.4 % — AB (ref 36.0–46.0)
HEMOGLOBIN: 10.9 g/dL — AB (ref 12.0–15.0)
MCH: 28.1 pg (ref 26.0–34.0)
MCHC: 31.7 g/dL (ref 30.0–36.0)
MCV: 88.7 fL (ref 78.0–100.0)
Platelets: 183 10*3/uL (ref 150–400)
RBC: 3.88 MIL/uL (ref 3.87–5.11)
RDW: 13.6 % (ref 11.5–15.5)
WBC: 14.5 10*3/uL — AB (ref 4.0–10.5)

## 2016-12-11 LAB — TYPE AND SCREEN
ABO/RH(D): O POS
ANTIBODY SCREEN: NEGATIVE

## 2016-12-11 MED ORDER — BENZOCAINE-MENTHOL 20-0.5 % EX AERO: 1.0000 "application " | INHALATION_SPRAY | CUTANEOUS | Status: DC | PRN

## 2016-12-11 MED ORDER — FLEET ENEMA 7-19 GM/118ML RE ENEM: 1.0000 | ENEMA | RECTAL | Status: DC | PRN

## 2016-12-11 MED ORDER — PRENATAL MULTIVITAMIN CH
1.0000 | ORAL_TABLET | Freq: Every day | ORAL | Status: DC
  Administered 2016-12-12 – 2016-12-13 (×2): 1 via ORAL
  Filled 2016-12-11: qty 1

## 2016-12-11 MED ORDER — SENNOSIDES-DOCUSATE SODIUM 8.6-50 MG PO TABS
2.0000 | ORAL_TABLET | ORAL | Status: DC
  Administered 2016-12-11 – 2016-12-12 (×2): 2 via ORAL
  Filled 2016-12-11 (×2): qty 2

## 2016-12-11 MED ORDER — DIBUCAINE 1 % RE OINT: 1.0000 "application " | TOPICAL_OINTMENT | RECTAL | Status: DC | PRN

## 2016-12-11 MED ORDER — ZOLPIDEM TARTRATE 5 MG PO TABS: 5.0000 mg | ORAL_TABLET | Freq: Every evening | ORAL | Status: DC | PRN

## 2016-12-11 MED ORDER — OXYTOCIN BOLUS FROM INFUSION
500.0000 mL | Freq: Once | INTRAVENOUS | Status: AC
  Administered 2016-12-11: 500 mL via INTRAVENOUS

## 2016-12-11 MED ORDER — SOD CITRATE-CITRIC ACID 500-334 MG/5ML PO SOLN: 30.0000 mL | ORAL | Status: DC | PRN

## 2016-12-11 MED ORDER — OXYCODONE-ACETAMINOPHEN 5-325 MG PO TABS: 1.0000 | ORAL_TABLET | ORAL | Status: DC | PRN

## 2016-12-11 MED ORDER — WITCH HAZEL-GLYCERIN EX PADS: 1.0000 "application " | MEDICATED_PAD | CUTANEOUS | Status: DC | PRN

## 2016-12-11 MED ORDER — OXYCODONE-ACETAMINOPHEN 5-325 MG PO TABS: 2.0000 | ORAL_TABLET | ORAL | Status: DC | PRN

## 2016-12-11 MED ORDER — LIDOCAINE HCL (PF) 1 % IJ SOLN
INTRAMUSCULAR | Status: AC
  Filled 2016-12-11: qty 30

## 2016-12-11 MED ORDER — TETANUS-DIPHTH-ACELL PERTUSSIS 5-2.5-18.5 LF-MCG/0.5 IM SUSP: 0.5000 mL | Freq: Once | INTRAMUSCULAR | Status: DC

## 2016-12-11 MED ORDER — ONDANSETRON HCL 4 MG/2ML IJ SOLN: 4.0000 mg | Freq: Four times a day (QID) | INTRAMUSCULAR | Status: DC | PRN

## 2016-12-11 MED ORDER — COCONUT OIL OIL
1.0000 "application " | TOPICAL_OIL | Status: DC | PRN
  Filled 2016-12-11: qty 120

## 2016-12-11 MED ORDER — ACETAMINOPHEN 325 MG PO TABS: 650.0000 mg | ORAL_TABLET | ORAL | Status: DC | PRN

## 2016-12-11 MED ORDER — ONDANSETRON HCL 4 MG/2ML IJ SOLN
4.0000 mg | INTRAMUSCULAR | Status: DC | PRN
Start: 2016-12-11 — End: 2016-12-13

## 2016-12-11 MED ORDER — ONDANSETRON HCL 4 MG PO TABS: 4.0000 mg | ORAL_TABLET | ORAL | Status: DC | PRN

## 2016-12-11 MED ORDER — LACTATED RINGERS IV SOLN: 500.0000 mL | INTRAVENOUS | Status: DC | PRN

## 2016-12-11 MED ORDER — OXYTOCIN 10 UNIT/ML IJ SOLN
INTRAMUSCULAR | Status: AC
  Filled 2016-12-11: qty 1

## 2016-12-11 MED ORDER — DIPHENHYDRAMINE HCL 25 MG PO CAPS: 25.0000 mg | ORAL_CAPSULE | Freq: Four times a day (QID) | ORAL | Status: DC | PRN

## 2016-12-11 MED ORDER — LACTATED RINGERS IV SOLN
INTRAVENOUS | Status: DC
  Administered 2016-12-11: 16:00:00 via INTRAVENOUS

## 2016-12-11 MED ORDER — OXYTOCIN 40 UNITS IN LACTATED RINGERS INFUSION - SIMPLE MED
INTRAVENOUS | Status: AC
  Filled 2016-12-11: qty 1000

## 2016-12-11 MED ORDER — FENTANYL CITRATE (PF) 100 MCG/2ML IJ SOLN: 100.0000 ug | INTRAMUSCULAR | Status: DC | PRN

## 2016-12-11 MED ORDER — SIMETHICONE 80 MG PO CHEW: 80.0000 mg | CHEWABLE_TABLET | ORAL | Status: DC | PRN

## 2016-12-11 MED ORDER — IBUPROFEN 600 MG PO TABS
600.0000 mg | ORAL_TABLET | Freq: Four times a day (QID) | ORAL | Status: DC
  Administered 2016-12-11 – 2016-12-13 (×7): 600 mg via ORAL
  Filled 2016-12-11 (×6): qty 1

## 2016-12-11 MED ORDER — LIDOCAINE HCL (PF) 1 % IJ SOLN
30.0000 mL | INTRAMUSCULAR | Status: DC | PRN
  Filled 2016-12-11: qty 30

## 2016-12-11 MED ORDER — OXYTOCIN 40 UNITS IN LACTATED RINGERS INFUSION - SIMPLE MED: 2.5000 [IU]/h | INTRAVENOUS | Status: DC

## 2016-12-11 NOTE — MAU Note (Signed)
..   I have communicated with Dr. Parke SimmersBland and reviewed vital signs:  Vitals:   12/11/16 1120 12/11/16 1228  BP:  130/81  Pulse:  (!) 114  Resp:    Temp:    SpO2: 99%     Vaginal exam:  Dilation: 4 Effacement (%): 70 Cervical Position: Posterior Station: -1 Presentation: Vertex Exam by:: Parke SimmersBland, MD ,   Also reviewed contraction pattern and that non-stress test is reactive.  It has been documented that patient is contracting every 3-4 minutes with minimal cervical change over 2 hours not indicating active labor.  Patient denies any other complaints.  Based on this report provider has given order for discharge.  A discharge order and diagnosis entered by a provider.   Labor discharge instructions reviewed with patient.

## 2016-12-11 NOTE — Discharge Summary (Deleted)
[facility]  Patient name: Chelsea Wallace Medical record number: [id number] Date of birth: [birth year] Age: 26 y.o. Gender: female Date of Admission: 12/11/2016  Date of Discharge: 12/11/16 Admitting Physician: Adam PhenixJames G Arnold, MD  Primary Care Provider: Patient, No Pcp Per  Discharge Diagnoses/Problem List:  False labor  Disposition: to h[facility]oe  Discharge Condition: stable  Discharge Exam: alert, cooperative, able to ambulate No IWB, regular respirations Regular heart rate when not contracting, distal pulses intact w/ no significant edema No bloody show, membrane intact 4/50/-3  Brief Hospital Course:  Monitored in MAU for ~2hrs   Issues for Follow Up:  1. Labor precautions, told to return with ROM, bleeding, true labor, SOB, chest pain or any other serious concern  Significant Labs and Imaging:  No results for input(s): WBC, HGB, HCT, PLT in the last 168 hours. No results for input(s): NA, K, CL, CO2, GLUCOSE, BUN, CREATININE, CALCIUM, MG, PHOS, ALKPHOS, AST, ALT, ALBUMIN, PROTEIN in the last 168 hours.  Invalid input(s): TBILI  No results found.  Discharge Medications:  Allergies as of 12/11/2016   No Known Allergies     Medication List    STOP taking these medications   cyclobenzaprine 10 MG tablet Commonly known as:  FLEXERIL   omeprazole 20 MG capsule Commonly known as:  PRILOSEC   terconazole 0.8 % vaginal cream Commonly known as:  TERAZOL 3   Vitamin D (Ergocalciferol) 50000 units Caps capsule Commonly known as:  DRISDOL     TAKE these medications   acetaminophen 500 MG tablet Commonly known as:  TYLENOL Take 1,000 mg by mouth every 6 (six) hours as needed for moderate pain.   prenatal multivitamin Tabs tablet Take 1 tablet by mouth daily at 12 noon.       Discharge Instructions: Please refer to Patient Instructions section of EMR for full details.  Patient was counseled important signs and symptoms that  should prompt return to medical care, changes in medications, dietary instructions, activity restrictions, and follow up appointments.   Marthenia RollingBland, Tonetta Napoles, DO 12/11/2016, 12:49 PM PGY-1, [facility]

## 2016-12-11 NOTE — MAU Note (Signed)
Pt. Present with c/o contracting every 3-5 minutes.  Positive for fetal movement, denies sudden gush of fluid, no vaginal bleeding noted.  GBS +, EFM applied - FHR 160s, Toco applied - abd. Soft. RN evaluating for r/o labor.

## 2016-12-11 NOTE — MAU Note (Signed)
Patient arrived via EMS with complaints of contractions.  Per patient, contractions are every three minutes.  SVE 90%/7/-1, with small amount of bloody show. EFM applied - FHR 150s-160, Contraction palpated. Dr. Parke SimmersBland notified, Ok to transfer to L&D for delivery.
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
## 2016-12-11 NOTE — Plan of Care (Signed)
Mother and baby bonding well

## 2016-12-12 LAB — RPR: RPR Ser Ql: NONREACTIVE

## 2016-12-12 NOTE — Progress Notes (Signed)
POSTPARTUM PROGRESS NOTE  Post Partum Day 1 Subjective: Chelsea Wallace is a 26 year old G3P3003 38wk2s s/p   Chelsea Wallace is a 26 y.o. [id number] 5576w2d s/p SVD.  No acute events overnight.  Pt denies problems with ambulating, voiding or po intake.  She denies nausea or vomiting.  Pain is well controlled.  She has not had flatus. She has not had bowel movement.  Lochia Small. Patients is breastfeeding with no problems and would like to use Depo for contraception after discharge.   Objective: Blood pressure (!) 100/54, pulse 84, temperature 98.1 F (36.7 C), resp. rate 18, last menstrual period 03/18/2016, SpO2 93 %, unknown if currently breastfeeding.  Physical Exam:  General: alert, cooperative and no distress Lochia:normal flow Chest: no respiratory distress Heart:regular rate, distal pulses intact Abdomen: soft, nontender,  Uterine Fundus: firm, appropriately tender DVT Evaluation: No calf swelling or tenderness Extremities: No edema  Recent Labs    12/11/16 1652  HGB 10.9*  HCT 34.4*    Assessment/Plan:  ASSESSMENT: Chelsea Wallace is a 26 y.o. A5W0981G3P3003 3276w2d s/p SVD PPD#1 who is doing well. Breast feeding without any problems. Has failed OCPs previously and would like to start Depo after discharge. Did not receive GBS treatment during labor will need to stay on more day for infant observation.  Plan to discharge tomorrow.     LOS: 1 day   Umaima Scholten PA-S 12/12/2016, 9:15 AM

## 2016-12-12 NOTE — Lactation Note (Signed)
This note was copied from a baby's chart. Lactation Consultation Note Baby 12 hrs old. Mom states BF well. Mom's 3rd baby. Mom BF her now 213 yr old for 5 months, and her now 26 yr old for 3 months. Denies difficulty. Mom holding baby BF in cradle position. Pulled baby off of breast shortly after LC entered rm. Latch looked good, mom denies painful latch.  Encouraged STS, I&O. Reminded mom of cluster feeding. Mom encouraged to feed baby 8-12 times/24 hours and with feeding cues.  Encouraged to call for assistance or questions.  Mom stated she was good. WH/LC brochure given w/resources, support groups and LC services. Patient Name: Chelsea Wallace ZOXWR'UToday's Date: 12/12/2016 Reason for consult: Initial assessment   Maternal Data Has patient been taught Hand Expression?: Yes Does the patient have breastfeeding experience prior to this delivery?: Yes  Feeding Feeding Type: Breast Fed Length of feed: 10 min  LATCH Score Latch: Grasps breast easily, tongue down, lips flanged, rhythmical sucking.  Audible Swallowing: A few with stimulation  Type of Nipple: Everted at rest and after stimulation(short shaft)  Comfort (Breast/Nipple): Soft / non-tender  Hold (Positioning): No assistance needed to correctly position infant at breast.  LATCH Score: 9  Interventions Interventions: Breast feeding basics reviewed  Lactation Tools Discussed/Used WIC Program: Yes   Consult Status Consult Status: PRN Date: 12/13/16 Follow-up type: In-patient    [name], [name] 12/12/2016, 4:59 AM

## 2016-12-12 NOTE — Plan of Care (Signed)
POC discussed with pt, no questions at this time.   

## 2016-12-12 NOTE — Lactation Note (Signed)
This note was copied from a baby's chart. Lactation Consultation Note  Patient Name: Chelsea Wallace Reason for consult: Follow-up assessment   Follow up with mom of 26 hour old infant. Infant with 10 BF for 10-32 minutes, 1 void and 9 stools in last 24 hours.   Infant was latched to the right breast in the cradle hold. Infant with shallow latch and sleeping more than eating. Enc mom to pull infant in towards the breast and to stimulate her to keep her feeding actively. Mom denies pain with latch.   Mom denies questions/concerns at this time. Enc mom to call out with any questions/concerns as needed.    Maternal Data Formula Feeding for Exclusion: No  Feeding Feeding Type: Breast Fed Length of feed: 10 min  LATCH Score                   Interventions    Lactation Tools Discussed/Used     Consult Status Consult Status: Follow-up Date: 12/13/16 Follow-up type: In-patient    Chelsea Wallace Wallace, 6:38 PM

## 2016-12-13 ENCOUNTER — Encounter (HOSPITAL_COMMUNITY): Payer: Self-pay | Admitting: *Deleted

## 2016-12-13 MED ORDER — IBUPROFEN 600 MG PO TABS: 600.0000 mg | ORAL_TABLET | Freq: Four times a day (QID) | ORAL | 0 refills | Status: DC

## 2016-12-13 NOTE — Discharge Instructions (Signed)

## 2016-12-13 NOTE — Discharge Summary (Signed)
OB Discharge Summary     Patient Name: Chelsea Wallace DOB: Dec 08, 1990 MRN: [id number]  Date of admission: 12/11/2016 Delivering MD: Pincus LargePHELPS, JAZMA Y   Date of discharge: 12/13/2016  Admitting diagnosis: 38wks ctx Intrauterine pregnancy: 6279w2d     Secondary diagnosis:  Active Problems:   Labor without complication   SVD (spontaneous vaginal delivery)  Additional problems: None     Discharge diagnosis: Term Pregnancy Delivered                                                                                                Post partum procedures:None  Augmentation: None  Complications: None  Hospital course:  Onset of Labor With Vaginal Delivery     26 y.o. yo G3P3003 at 4379w2d was admitted in Active Labor on 12/11/2016. Patient had an uncomplicated labor course as follows:  Membrane Rupture Time/Date: 4:23 PM ,12/11/2016   Intrapartum Procedures: Episiotomy: None [1]                                         Lacerations:  None [1]  Patient had a delivery of a Viable infant. 12/11/2016  Information for the patient's newborn:  Marcy PanningLegrand, Girl Adelma [[id number]][[id number]]  Delivery Method: Vaginal, Spontaneous(Filed from Delivery Summary)    Pateint had an uncomplicated postpartum course.  She is ambulating, tolerating a regular diet, passing flatus, and urinating well. Patient is discharged home in stable condition on 12/13/16.   Physical exam  Vitals:   12/11/16 2342 12/12/16 0700 12/12/16 1805 12/13/16 0530  BP: (!) 107/59 (!) 100/54 111/69 104/60  Pulse: 85 84 (!) 107 82  Resp: 16 18 18 17   Temp: 98.6 F (37 C) 98.1 F (36.7 C) 97.8 F (36.6 C) 98 F (36.7 C)  TempSrc:   Oral Oral  SpO2:    99%   General: alert, cooperative and no distress Lochia: appropriate Uterine Fundus: firm Incision: Healing well with no significant drainage DVT Evaluation: No evidence of DVT seen on physical exam. Labs: Lab Results  Component Value Date   WBC 14.5 (H) 12/11/2016   HGB 10.9 (L)  12/11/2016   HCT 34.4 (L) 12/11/2016   MCV 88.7 12/11/2016   PLT 183 12/11/2016   CMP Latest Ref Rng & Units 11/05/2016  Glucose 65 - 99 mg/dL 130(Q114(H)  BUN 6 - 20 mg/dL 6  Creatinine 6.570.44 - 8.461.00 mg/dL 9.620.60  Sodium 952135 - 841145 mmol/L 135  Potassium 3.5 - 5.1 mmol/L 3.4(L)  Chloride 101 - 111 mmol/L 107  CO2 22 - 32 mmol/L 18(L)  Calcium 8.9 - 10.3 mg/dL 3.2(G8.7(L)  Total Protein 6.5 - 8.1 g/dL 6.6  Total Bilirubin 0.3 - 1.2 mg/dL 0.8  Alkaline Phos 38 - 126 U/L 165(H)  AST 15 - 41 U/L 24  ALT 14 - 54 U/L 11(L)    Discharge instruction: per After Visit Summary and "Baby and Me Booklet".  After visit meds:  Allergies as of 12/13/2016   No Known Allergies  Medication List    TAKE these medications   acetaminophen 500 MG tablet Commonly known as:  TYLENOL Take 1,000 mg by mouth every 6 (six) hours as needed for moderate pain.   ibuprofen 600 MG tablet Commonly known as:  ADVIL,MOTRIN Take 1 tablet (600 mg total) by mouth every 6 (six) hours.   prenatal multivitamin Tabs tablet Take 1 tablet by mouth daily at 12 noon.       Diet: routine diet  Activity: Advance as tolerated. Pelvic rest for 6 weeks.   Outpatient follow up:4 weeks Follow up Appt: Future Appointments  Date Time Provider Department Center  01/09/2017  9:10 AM Rolm BookbinderNeill, Caroline M, CNM [facility] None   Follow up Visit:No Follow-up on file.  Postpartum contraception: Combination OCPs  Newborn Data: Live born female  Birth Weight: 8 lb 1.6 oz (3675 g) APGAR: 9, 9  Newborn Delivery   Birth date/time:  12/11/2016 16:26:00 Delivery type:  Vaginal, Spontaneous     Baby Feeding: Breast Disposition:home with mother   12/13/2016 Lovena NeighboursAbdoulaye Diallo, MD   I confirm that I have verified the information documented in the resident's note and that I have also personally reperformed the physical exam and all medical decision making activities.  Luna KitchensKathryn Satya Buttram CNM

## 2016-12-13 NOTE — Lactation Note (Signed)
This note was copied from a baby's chart. Lactation Consultation Note  Patient Name: Chelsea Wallace FAOZH'YToday's Date: 12/13/2016  Mom c/o sore nipples. Breasts filling.  Discussed importance of obtaining wide latch.  Offered assist and mom may call for latch assist prior to discharge. Discussed milk coming to volume and engorgement treatment.  Lactation outpatient services and support information reviewed and encouraged prn.   Maternal Data    Feeding Feeding Type: Breast Fed  LATCH Score                   Interventions    Lactation Tools Discussed/Used     Consult Status      Huston FoleyMOULDEN, Donaldson Richter S 12/13/2016, 9:35 AM

## 2016-12-17 ENCOUNTER — Telehealth: Payer: Self-pay

## 2016-12-17 NOTE — Telephone Encounter (Signed)
Pt called stating that she is having pelvic pain. Stating that the 600 mg Ibuprofen prescribed is not helping. She wants to know if the dosage can be increased, or if she can have a different prescription. Pt also request that depo be sent to her pharmacy so that she will have it for her PP visit

## 2016-12-18 ENCOUNTER — Encounter: Payer: Medicaid Other | Admitting: Certified Nurse Midwife

## 2016-12-18 ENCOUNTER — Other Ambulatory Visit: Payer: Self-pay | Admitting: Certified Nurse Midwife

## 2016-12-18 MED ORDER — TRAMADOL HCL 50 MG PO TABS: 50.0000 mg | ORAL_TABLET | Freq: Four times a day (QID) | ORAL | 0 refills | Status: DC | PRN

## 2016-12-18 MED ORDER — MEDROXYPROGESTERONE ACETATE 150 MG/ML IM SUSP: 150.0000 mg | INTRAMUSCULAR | 4 refills | Status: DC
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
## 2016-12-18 NOTE — Telephone Encounter (Signed)
Please let her know that Ultram and Depo were sent to her pharmacy.  Thank you.  Rachelle

## 2016-12-19 ENCOUNTER — Other Ambulatory Visit: Payer: Self-pay | Admitting: Certified Nurse Midwife

## 2016-12-19 DIAGNOSIS — R52 Pain, unspecified: Principal | ICD-10-CM

## 2016-12-19 DIAGNOSIS — O9089 Other complications of the puerperium, not elsewhere classified: Secondary | ICD-10-CM

## 2016-12-19 MED ORDER — IBUPROFEN 800 MG PO TABS: 800.0000 mg | ORAL_TABLET | Freq: Three times a day (TID) | ORAL | 1 refills | Status: DC | PRN

## 2016-12-19 MED ORDER — OXYCODONE-ACETAMINOPHEN 5-325 MG PO TABS: 1.0000 | ORAL_TABLET | ORAL | 0 refills | Status: DC | PRN

## 2016-12-31 ENCOUNTER — Telehealth: Payer: Self-pay | Admitting: Pediatrics

## 2016-12-31 NOTE — Telephone Encounter (Signed)
Tramadol requires PA.    Tramadol was sent 12/17/16 Percocet was rx'd 12/19/16  Would you like to dc tramadol rx since pt has rx for percocet? Please advise.

## 2017-01-01 NOTE — Telephone Encounter (Signed)
No Tramadol active in med list at this time.

## 2017-01-01 NOTE — Telephone Encounter (Signed)
Yes, she was unable to pick up the Toradol, not covered by her insurance.  Thank you. Zaneta Lightcap

## 2017-01-09 ENCOUNTER — Ambulatory Visit: Payer: Medicaid Other

## 2017-01-23 ENCOUNTER — Encounter: Payer: Self-pay | Admitting: Certified Nurse Midwife

## 2017-01-23 ENCOUNTER — Ambulatory Visit (INDEPENDENT_AMBULATORY_CARE_PROVIDER_SITE_OTHER): Payer: Medicaid Other | Admitting: Certified Nurse Midwife

## 2017-01-23 VITALS — BP 125/84 | HR 83 | Wt 166.4 lb

## 2017-01-23 DIAGNOSIS — Z30013 Encounter for initial prescription of injectable contraceptive: Secondary | ICD-10-CM

## 2017-01-23 DIAGNOSIS — Z3042 Encounter for surveillance of injectable contraceptive: Secondary | ICD-10-CM

## 2017-01-23 MED ORDER — MEDROXYPROGESTERONE ACETATE 150 MG/ML IM SUSP
150.0000 mg | Freq: Once | INTRAMUSCULAR | Status: AC
  Administered 2017-01-23: 150 mg via INTRAMUSCULAR

## 2017-01-23 NOTE — Progress Notes (Signed)
Post Partum Exam  Chelsea Wallace is a 27 y.o. 803P3003 female who presents for a postpartum visit. She is 6 weeks postpartum following a spontaneous vaginal delivery. I have fully reviewed the prenatal and intrapartum course. The delivery was at 38 2/7 gestational weeks.  Anesthesia: none. Postpartum course has been good. Baby's course has been good. Baby is feeding by breast. Bleeding no bleeding. Bowel function is normal. Bladder function is normal. Patient is not sexually active..  Patient requesting Depo Provera.  Postpartum depression screening: SCORE 0  The following portions of the patient's history were reviewed and updated as appropriate: allergies, current medications, past family history, past medical history, past social history, past surgical history and problem list.  Review of Systems Pertinent items are noted in HPI.    Objective:  Blood pressure 125/84, pulse 83, weight 166 lb 6.4 oz (75.5 kg), last menstrual period 03/18/2016, currently breastfeeding.  General:  alert, cooperative and no distress   Breasts:  inspection negative, no nipple discharge or bleeding, no masses or nodularity palpable  Lungs: clear to auscultation bilaterally  Heart:  regular rate and rhythm, S1, S2 normal, no murmur, click, rub or gallop  Abdomen: soft, non-tender; bowel sounds normal; no masses,  no organomegaly  Pelvic/Rectal Exam: Not performed.       Pap Smear: 06/24/16: LSIL  Assessment:    Normal 4 week postpartum exam. Pap smear not done at today's visit.   Plan:   1. Contraception: Depo-Provera injections 2.  F/U Depo injections every 3 months emphasized.   Needs Colposcopy: LSIL.   3. Follow up in: 3 months for Depo injections, Colposcopy ASAP & as needed.

## 2017-01-28 ENCOUNTER — Encounter: Payer: Self-pay | Admitting: *Deleted

## 2017-02-10 ENCOUNTER — Encounter: Payer: Medicaid Other | Admitting: Obstetrics and Gynecology

## 2017-02-19 ENCOUNTER — Encounter: Payer: Medicaid Other | Admitting: Obstetrics & Gynecology

## 2017-02-26 ENCOUNTER — Encounter: Payer: Self-pay | Admitting: Obstetrics & Gynecology

## 2017-02-26 ENCOUNTER — Ambulatory Visit (INDEPENDENT_AMBULATORY_CARE_PROVIDER_SITE_OTHER): Payer: Medicaid Other | Admitting: Obstetrics & Gynecology

## 2017-02-26 VITALS — BP 135/83 | HR 73 | Wt 168.0 lb

## 2017-02-26 DIAGNOSIS — Z3202 Encounter for pregnancy test, result negative: Secondary | ICD-10-CM

## 2017-02-26 DIAGNOSIS — R87612 Low grade squamous intraepithelial lesion on cytologic smear of cervix (LGSIL): Secondary | ICD-10-CM

## 2017-02-26 LAB — POCT URINE PREGNANCY: PREG TEST UR: NEGATIVE

## 2017-02-26 NOTE — Patient Instructions (Signed)

## 2017-02-26 NOTE — Progress Notes (Signed)
Subjective:     [name], female   DOB: 01/11/1991, 27 y.o.   MRN: [id number] Cc: abnormal pap HPI G3P3003 Patient's last menstrual period was 02/09/2017 (exact date). Pap 06/2016 had LSIL, she was pregnant is is postpartum 12 weeks. Colposcopy was scheduled today. Current ASCCP recommendation is to repeat cotesting 12 months  Review of Systems  Constitutional: Negative.   Gastrointestinal: Negative.   Genitourinary: Negative.        Objective:   Physical Exam  Constitutional: She is oriented to person, place, and time. She appears well-developed. No distress.  Pulmonary/Chest: Effort normal.  Genitourinary: Vagina normal.  Genitourinary Comments: Cervix grossly normal no lesions, no colposcopy done  Neurological: She is alert and oriented to person, place, and time.  Psychiatric: She has a normal mood and affect. Her behavior is normal.       Assessment:     LSIL pap age 27    Plan:     Pap with cytology in 5 months  Adam PhenixArnold, Keyshun Elpers G, MD 02/26/2017

## 2017-04-14 ENCOUNTER — Ambulatory Visit: Payer: Medicaid Other

## 2017-04-16 ENCOUNTER — Ambulatory Visit: Payer: Medicaid Other

## 2017-04-16 ENCOUNTER — Ambulatory Visit (INDEPENDENT_AMBULATORY_CARE_PROVIDER_SITE_OTHER): Payer: Medicaid Other

## 2017-04-16 DIAGNOSIS — Z3042 Encounter for surveillance of injectable contraceptive: Secondary | ICD-10-CM

## 2017-04-16 MED ORDER — MEDROXYPROGESTERONE ACETATE 150 MG/ML IM SUSP
150.0000 mg | Freq: Once | INTRAMUSCULAR | Status: AC
  Administered 2017-04-16: 150 mg via INTRAMUSCULAR

## 2017-04-16 NOTE — Progress Notes (Signed)
Pt presents for Depo injection Pt supplied  Last Injection: 01/23/2017 Next Depo Due: Jun 19-Jul 3rd  Pt made aware  Pt tolerated Injection well LUOQ

## 2017-05-16 ENCOUNTER — Other Ambulatory Visit: Payer: Self-pay | Admitting: Certified Nurse Midwife

## 2017-05-16 DIAGNOSIS — R52 Pain, unspecified: Principal | ICD-10-CM

## 2017-05-16 DIAGNOSIS — O9089 Other complications of the puerperium, not elsewhere classified: Secondary | ICD-10-CM

## 2017-07-08 ENCOUNTER — Ambulatory Visit: Payer: Medicaid Other

## 2017-07-09 ENCOUNTER — Ambulatory Visit (INDEPENDENT_AMBULATORY_CARE_PROVIDER_SITE_OTHER): Payer: Medicaid Other | Admitting: *Deleted

## 2017-07-09 DIAGNOSIS — Z3042 Encounter for surveillance of injectable contraceptive: Secondary | ICD-10-CM | POA: Diagnosis not present

## 2017-07-09 MED ORDER — MEDROXYPROGESTERONE ACETATE 150 MG/ML IM SUSP
150.0000 mg | Freq: Once | INTRAMUSCULAR | Status: AC
  Administered 2017-07-09: 150 mg via INTRAMUSCULAR

## 2017-07-09 NOTE — Progress Notes (Signed)
Pt is in office for depo injection today, pt is on time for depo. Pt supplied depo for today's visit. Pt tolerated injection well. Pt advised to RTO on 09/30/17 for next depo.  Pt has no other concerns today.  Administrations This Visit    medroxyPROGESTERone (DEPO-PROVERA) injection 150 mg    Admin Date 07/09/2017 Action Given Dose 150 mg Route Intramuscular Administered By Lanney GinsFoster, Marialuisa Basara D, CMA

## 2017-07-09 NOTE — Progress Notes (Signed)
I have reviewed the chart and agree with nursing staff's documentation of this patient's encounter.  Roe CoombsRachelle A Tajanae Guilbault, CNM 07/09/2017 3:37 PM

## 2017-12-22 ENCOUNTER — Encounter: Payer: Self-pay | Admitting: *Deleted

## 2017-12-23 ENCOUNTER — Ambulatory Visit (INDEPENDENT_AMBULATORY_CARE_PROVIDER_SITE_OTHER): Payer: Medicaid Other | Admitting: *Deleted

## 2017-12-23 DIAGNOSIS — Z3202 Encounter for pregnancy test, result negative: Secondary | ICD-10-CM | POA: Diagnosis not present

## 2017-12-23 DIAGNOSIS — Z3009 Encounter for other general counseling and advice on contraception: Secondary | ICD-10-CM

## 2017-12-23 LAB — POCT URINE PREGNANCY: PREG TEST UR: NEGATIVE

## 2017-12-23 MED ORDER — MEDROXYPROGESTERONE ACETATE 150 MG/ML IM SUSP: 150.0000 mg | INTRAMUSCULAR | 0 refills | Status: DC

## 2017-12-23 NOTE — Progress Notes (Signed)
I have reviewed the chart and agree with nursing staff's documentation of this patient's encounter.  Farrie Sann, MD 12/23/2017 3:48 PM    

## 2017-12-23 NOTE — Progress Notes (Signed)
Pt is in office for UPT for Depo restart today. UPT in office is negative.  Pt advised to abstain from intercourse for 2 weeks and RTO for repeat UPT and depo injection.    Rx sent today, pt instructed to bring at next visit. Pt advised she will be due for routine exam after January 10 and may continue refills at that visit.    Pt has no other concerns today.

## 2018-01-08 ENCOUNTER — Ambulatory Visit: Payer: Medicaid Other

## 2018-01-15 ENCOUNTER — Telehealth: Payer: Self-pay | Admitting: Obstetrics & Gynecology

## 2018-01-21 ENCOUNTER — Ambulatory Visit: Payer: Medicaid Other

## 2018-01-28 ENCOUNTER — Ambulatory Visit: Payer: Medicaid Other | Admitting: Advanced Practice Midwife

## 2018-01-28 ENCOUNTER — Encounter: Payer: Self-pay | Admitting: Advanced Practice Midwife

## 2018-01-28 ENCOUNTER — Other Ambulatory Visit (HOSPITAL_COMMUNITY)
Admission: RE | Admit: 2018-01-28 | Discharge: 2018-01-28 | Disposition: A | Discharge status: Home / Self Care | Payer: Medicaid Other | Source: Ambulatory Visit | Attending: Advanced Practice Midwife | Admitting: Advanced Practice Midwife

## 2018-01-28 VITALS — BP 116/73 | HR 84 | Ht 65.0 in | Wt 171.6 lb

## 2018-01-28 DIAGNOSIS — Z01419 Encounter for gynecological examination (general) (routine) without abnormal findings: Secondary | ICD-10-CM

## 2018-01-28 DIAGNOSIS — Z3009 Encounter for other general counseling and advice on contraception: Secondary | ICD-10-CM

## 2018-01-28 DIAGNOSIS — R87612 Low grade squamous intraepithelial lesion on cytologic smear of cervix (LGSIL): Secondary | ICD-10-CM | POA: Diagnosis not present

## 2018-01-28 LAB — POCT URINE PREGNANCY: PREG TEST UR: NEGATIVE

## 2018-01-28 NOTE — Patient Instructions (Signed)
Preventive Care 18-39 Years, Female Preventive care refers to lifestyle choices and visits with your health care provider that can promote health and wellness. What does preventive care include?   A yearly physical exam. This is also called an annual well check.  Dental exams once or twice a year.  Routine eye exams. Ask your health care provider how often you should have your eyes checked.  Personal lifestyle choices, including: ? Daily care of your teeth and gums. ? Regular physical activity. ? Eating a healthy diet. ? Avoiding tobacco and drug use. ? Limiting alcohol use. ? Practicing safe sex. ? Taking vitamin and mineral supplements as recommended by your health care provider. What happens during an annual well check? The services and screenings done by your health care provider during your annual well check will depend on your age, overall health, lifestyle risk factors, and family history of disease. Counseling Your health care provider may ask you questions about your:  Alcohol use.  Tobacco use.  Drug use.  Emotional well-being.  Home and relationship well-being.  Sexual activity.  Eating habits.  Work and work environment.  Method of birth control.  Menstrual cycle.  Pregnancy history. Screening You may have the following tests or measurements:  Height, weight, and BMI.  Diabetes screening. This is done by checking your blood sugar (glucose) after you have not eaten for a while (fasting).  Blood pressure.  Lipid and cholesterol levels. These may be checked every 5 years starting at age 20.  Skin check.  Hepatitis C blood test.  Hepatitis B blood test.  Sexually transmitted disease (STD) testing.  BRCA-related cancer screening. This may be done if you have a family history of breast, ovarian, tubal, or peritoneal cancers.  Pelvic exam and Pap test. This may be done every 3 years starting at age 21. Starting at age 30, this may be done every 5  years if you have a Pap test in combination with an HPV test. Discuss your test results, treatment options, and if necessary, the need for more tests with your health care provider. Vaccines Your health care provider may recommend certain vaccines, such as:  Influenza vaccine. This is recommended every year.  Tetanus, diphtheria, and acellular pertussis (Tdap, Td) vaccine. You may need a Td booster every 10 years.  Varicella vaccine. You may need this if you have not been vaccinated.  HPV vaccine. If you are 26 or younger, you may need three doses over 6 months.  Measles, mumps, and rubella (MMR) vaccine. You may need at least one dose of MMR. You may also need a second dose.  Pneumococcal 13-valent conjugate (PCV13) vaccine. You may need this if you have certain conditions and were not previously vaccinated.  Pneumococcal polysaccharide (PPSV23) vaccine. You may need one or two doses if you smoke cigarettes or if you have certain conditions.  Meningococcal vaccine. One dose is recommended if you are age 19-21 years and a first-year college student living in a residence hall, or if you have one of several medical conditions. You may also need additional booster doses.  Hepatitis A vaccine. You may need this if you have certain conditions or if you travel or work in places where you may be exposed to hepatitis A.  Hepatitis B vaccine. You may need this if you have certain conditions or if you travel or work in places where you may be exposed to hepatitis B.  Haemophilus influenzae type b (Hib) vaccine. You may need this if you   have certain risk factors. Talk to your health care provider about which screenings and vaccines you need and how often you need them. This information is not intended to replace advice given to you by your health care provider. Make sure you discuss any questions you have with your health care provider. Document Released: 02/26/2001 Document Revised: 08/13/2016  Document Reviewed: 11/01/2014 Elsevier Interactive Patient Education  2019 [employer].

## 2018-01-28 NOTE — Progress Notes (Signed)
Patient is in the office for annual, last pap 06-24-16. Pt is no longer on depo, states that it was making her her thinner. Pt wants to discuss other options. Pt states that last depo was in June, has not really had a cycle just irregular spotting. Pt also wants std testing.

## 2018-01-28 NOTE — Progress Notes (Signed)
GYNECOLOGY ANNUAL PREVENTATIVE CARE ENCOUNTER NOTE  Subjective:   Chelsea Wallace is a 28 y.o. [id number] female here for a routine annual gynecologic exam.  Current complaints: none. Patient previously used Depo Provera for contraception but was uncomfortable with the side effect profile. She has not had a Depo injection since June.   Denies abnormal vaginal bleeding, discharge, pelvic pain, problems with intercourse or other gynecologic concerns.   Female partners, unsure of testing status. Denies IPV.    Gynecologic History No LMP recorded. (Menstrual status: Irregular Periods). Contraception: abstinence and condoms Last Pap: 06/2016. Results were: abnormal (LSIL) Last mammogram:N/A.  Obstetric History OB History  Gravida Para Term Preterm AB Living  3 3 3  0 0 3  SAB TAB Ectopic Multiple Live Births  0 0 0 0 3    # Outcome Date GA Lbr Len/2nd Weight Sex Delivery Anes PTL Lv  3 Term 12/11/16 [redacted]w[redacted]d 03:53 / 00:03 8 lb 1.6 oz (3.675 kg) F Vag-Spont None  LIV  2 Term 01/31/15 [redacted]w[redacted]d 12:33 / 00:17 8 lb 8.2 oz (3.86 kg) M Vag-Spont EPI  LIV     Birth Comments: WNL  1 Term 05/23/13 [redacted]w[redacted]d 31:30 / 01:29 7 lb 9.5 oz (3.445 kg) M Vag-Spont EPI  LIV    Past Medical History:  Diagnosis Date  . GERD (gastroesophageal reflux disease)    on prescription for it; pt cannot remember name of med.  . Medical history non-contributory     Past Surgical History:  Procedure Laterality Date  . MANDIBLE RECONSTRUCTION     lower jaw surgery 2011  . MANDIBLE SURGERY Bilateral    age 65 or 68, manibles fx to adjust bite    Current Outpatient Medications on File Prior to Visit  Medication Sig Dispense Refill  . acetaminophen (TYLENOL) 500 MG tablet Take 1,000 mg by mouth every 6 (six) hours as needed for moderate pain.    Marland Kitchen ibuprofen (ADVIL,MOTRIN) 800 MG tablet Take 1 tablet (800 mg total) by mouth every 8 (eight) hours as needed. (Patient not taking: Reported on 01/28/2018) 60 tablet 1  .  medroxyPROGESTERone (DEPO-PROVERA) 150 MG/ML injection Inject 1 mL (150 mg total) into the muscle every 3 (three) months. (Patient not taking: Reported on 01/28/2018) 1 mL 0  . oxyCODONE-acetaminophen (PERCOCET/ROXICET) 5-325 MG tablet Take 1 tablet by mouth every 4 (four) hours as needed for severe pain. (Patient not taking: Reported on 02/26/2017) 30 tablet 0  . Prenatal Vit-Fe Fumarate-FA (PRENATAL MULTIVITAMIN) TABS tablet Take 1 tablet by mouth daily at 12 noon.     No current facility-administered medications on file prior to visit.     No Known Allergies  Social History:  reports that she has never smoked. She has never used smokeless tobacco. She reports that she does not drink alcohol or use drugs.  Family History  Problem Relation Age of Onset  . Diabetes Mother   . Diabetes Maternal Grandfather   . Hypertension Paternal Grandmother   . Hypertension Paternal Grandfather     The following portions of the patient's history were reviewed and updated as appropriate: allergies, current medications, past family history, past medical history, past social history, past surgical history and problem list.  Review of Systems Pertinent items noted in HPI and remainder of comprehensive ROS otherwise negative.   Objective:  BP 116/73   Pulse 84   Ht 5\' 5"  (1.651 m)   Wt 171 lb 9.6 oz (77.8 kg)   BMI 28.56 kg/m  CONSTITUTIONAL:  Well-developed, well-nourished female in no acute distress.  HENT:  Normocephalic, atraumatic, External right and left ear normal. Oropharynx is clear and moist EYES: Conjunctivae and EOM are normal. Pupils are equal, round, and reactive to light. No scleral icterus.  NECK: Normal range of motion, supple, no masses.  Normal thyroid.  SKIN: Skin is warm and dry. No rash noted. Not diaphoretic. No erythema. No pallor. MUSCULOSKELETAL: Normal range of motion. No tenderness.  No cyanosis, clubbing, or edema.  2+ distal pulses. NEUROLOGIC: Alert and oriented to  person, place, and time. Normal reflexes, muscle tone coordination. No cranial nerve deficit noted. PSYCHIATRIC: Normal mood and affect. Normal behavior. Normal judgment and thought content. CARDIOVASCULAR: Normal heart rate noted, regular rhythm RESPIRATORY: Clear to auscultation bilaterally. Effort and breath sounds normal, no problems with respiration noted. BREASTS: Symmetric in size. No masses, skin changes, nipple drainage, or lymphadenopathy. ABDOMEN: Soft, normal bowel sounds, no distention noted.  No tenderness, rebound or guarding.  PELVIC: Normal appearing external genitalia; normal appearing vaginal mucosa and cervix.  No abnormal discharge noted.  Pap smear obtained.  Normal uterine size, no other palpable masses, no uterine or adnexal tenderness.    Assessment and Plan:  1. Well woman exam with routine gynecological exam --No complaints or concerns  2. Encounter for other general counseling or advice on contraception --Patient would like to switch from depo --Reviewed all forms of birth control options available including abstinence; fertility period awareness methods; over the counter/barrier methods; hormonal contraceptive medication including pill, patch, ring, injection,contraceptive implant; hormonal and nonhormonal IUDs; permanent sterilization options including vasectomy and the various tubal sterilization modalities. Risks and benefits reviewed.  Questions were answered.  Information was given to patient to review.  --Patient to schedule IUD or Nexplanon placement at her convenience  3. LGSIL on Pap smear of cervix --LSIL noted 06/24/2016 --S/p colposcopy 02/26/2017  Will follow up results of pap smear and manage accordingly. Routine preventative health maintenance measures emphasized. Please refer to After Visit Summary for other counseling recommendations.   Clayton Bibles, CNM Obstetrician & Gynecologist, Biochemist, clinical for [employer], [facility]

## 2018-01-29 LAB — CBC
HEMATOCRIT: 37.4 % (ref 34.0–46.6)
Hemoglobin: 12.4 g/dL (ref 11.1–15.9)
MCH: 30.2 pg (ref 26.6–33.0)
MCHC: 33.2 g/dL (ref 31.5–35.7)
MCV: 91 fL (ref 79–97)
Platelets: 175 10*3/uL (ref 150–450)
RBC: 4.11 x10E6/uL (ref 3.77–5.28)
RDW: 12.9 % (ref 11.7–15.4)
WBC: 4.5 10*3/uL (ref 3.4–10.8)

## 2018-01-29 LAB — HEPATITIS B SURFACE ANTIGEN: HEP B S AG: NEGATIVE

## 2018-01-29 LAB — RPR: RPR: NONREACTIVE

## 2018-01-29 LAB — HEPATITIS C ANTIBODY: Hep C Virus Ab: 0.1 s/co ratio (ref 0.0–0.9)

## 2018-01-29 LAB — HIV ANTIBODY (ROUTINE TESTING W REFLEX): HIV SCREEN 4TH GENERATION: NONREACTIVE

## 2018-01-30 LAB — CYTOLOGY - PAP
Bacterial vaginitis: POSITIVE — AB
Candida vaginitis: NEGATIVE
Chlamydia: NEGATIVE
Diagnosis: NEGATIVE
HPV: NOT DETECTED
Neisseria Gonorrhea: NEGATIVE
Trichomonas: NEGATIVE

## 2018-01-31 ENCOUNTER — Other Ambulatory Visit: Payer: Self-pay | Admitting: Advanced Practice Midwife

## 2018-01-31 MED ORDER — METRONIDAZOLE 500 MG PO TABS: 500.0000 mg | ORAL_TABLET | Freq: Two times a day (BID) | ORAL | 0 refills | Status: DC

## 2018-01-31 NOTE — Progress Notes (Unsigned)
Bacterial vaginosis noted on Pap. Rx to pharmacy. Will notify patient via active MyChart account.  Clayton Bibles, CNM 01/31/18 12:04 PM

## 2018-03-10 ENCOUNTER — Ambulatory Visit: Payer: Medicaid Other

## 2018-03-11 ENCOUNTER — Ambulatory Visit (INDEPENDENT_AMBULATORY_CARE_PROVIDER_SITE_OTHER): Payer: Medicaid Other | Admitting: Nurse Practitioner

## 2018-03-11 ENCOUNTER — Other Ambulatory Visit (HOSPITAL_COMMUNITY)
Admission: RE | Admit: 2018-03-11 | Discharge: 2018-03-11 | Disposition: A | Discharge status: Home / Self Care | Payer: Medicaid Other | Source: Ambulatory Visit | Attending: Nurse Practitioner | Admitting: Nurse Practitioner

## 2018-03-11 ENCOUNTER — Encounter: Payer: Self-pay | Admitting: Nurse Practitioner

## 2018-03-11 VITALS — BP 112/78 | HR 89 | Wt 167.6 lb

## 2018-03-11 DIAGNOSIS — Z113 Encounter for screening for infections with a predominantly sexual mode of transmission: Secondary | ICD-10-CM | POA: Diagnosis not present

## 2018-03-11 DIAGNOSIS — Z3042 Encounter for surveillance of injectable contraceptive: Secondary | ICD-10-CM | POA: Insufficient documentation

## 2018-03-11 DIAGNOSIS — Z30013 Encounter for initial prescription of injectable contraceptive: Secondary | ICD-10-CM | POA: Diagnosis not present

## 2018-03-11 DIAGNOSIS — R87612 Low grade squamous intraepithelial lesion on cytologic smear of cervix (LGSIL): Secondary | ICD-10-CM

## 2018-03-11 DIAGNOSIS — Z3202 Encounter for pregnancy test, result negative: Secondary | ICD-10-CM | POA: Diagnosis not present

## 2018-03-11 DIAGNOSIS — Z6827 Body mass index (BMI) 27.0-27.9, adult: Secondary | ICD-10-CM

## 2018-03-11 DIAGNOSIS — O99210 Obesity complicating pregnancy, unspecified trimester: Secondary | ICD-10-CM | POA: Insufficient documentation

## 2018-03-11 LAB — POCT URINE PREGNANCY: Preg Test, Ur: NEGATIVE

## 2018-03-11 MED ORDER — MEDROXYPROGESTERONE ACETATE 150 MG/ML IM SUSP
150.0000 mg | Freq: Once | INTRAMUSCULAR | Status: AC
  Administered 2018-03-11: 150 mg via INTRAMUSCULAR

## 2018-03-11 MED ORDER — MEDROXYPROGESTERONE ACETATE 150 MG/ML IM SUSP: 150.0000 mg | INTRAMUSCULAR | 4 refills | Status: DC

## 2018-03-11 MED ORDER — MEDROXYPROGESTERONE ACETATE 150 MG/ML IM SUSP: 150.0000 mg | Freq: Once | INTRAMUSCULAR | Status: DC

## 2018-03-11 NOTE — Progress Notes (Signed)
   GYNECOLOGY OFFICE VISIT NOTE   History:  28 y.o. [id number] here today for vaginal discharge and odor. She denies any abnormal vaginal bleeding, pelvic pain or other concerns.  In the last 3 months, she has been treated twice with oral medications for BV and once with Metrogel vaginal cream.  She has not noticed any difference in the vaginal discharge.  Currently using condoms for intercourse.  Denies any douching.  Past Medical History:  Diagnosis Date  . GERD (gastroesophageal reflux disease)    on prescription for it; pt cannot remember name of med.  . Medical history non-contributory     Past Surgical History:  Procedure Laterality Date  . MANDIBLE RECONSTRUCTION     lower jaw surgery 2011  . MANDIBLE SURGERY Bilateral    age 30 or 59, manibles fx to adjust bite    The following portions of the patient's history were reviewed and updated as appropriate: allergies, current medications, past family history, past medical history, past social history, past surgical history and problem list.     Review of Systems:  Pertinent items noted in HPI and remainder of comprehensive ROS otherwise negative.  Objective:  Physical Exam BP 112/78   Pulse 89   Wt 167 lb 9.6 oz (76 kg)   LMP 02/19/2018 (Exact Date)   Breastfeeding No   BMI 27.89 kg/m  CONSTITUTIONAL: Well-developed, well-nourished female in no acute distress.  HENT:  Normocephalic, atraumatic. External right and left ear normal.  EYES: Conjunctivae and EOM are normal. Pupils are equal, round.  No scleral icterus.  NECK: Normal range of motion, supple, no masses SKIN: Skin is warm and dry. No rash noted. Not diaphoretic. No erythema. No pallor. NEUROLOGIC: Alert and oriented to person, place, and time. Normal muscle tone coordination. No cranial nerve deficit noted. PSYCHIATRIC: Normal mood and affect. Normal behavior. Normal judgment and thought content. ABDOMEN: Soft, no distention noted.   PELVIC: normal genital area,  small amount of creamy white discharge deep in the vagina and on the cervix.  No odor noted. MUSCULOSKELETAL: Normal range of motion. No edema noted.  Labs and Imaging No results found.  Office pregnancy test was negative.  Assessment & Plan:  1. Initiation of Depo Provera Restarting Depo - currently using condoms and instructed to continue to use condoms for at least 2 weeks for contraception.  2. Routine screening for STI (sexually transmitted infection) Wanted STD screening but did not get blood drawn at her visit here today.  Rolled orders to future orders.  - Hepatitis B surface antigen - Hepatitis C antibody - HIV Antibody (routine testing w rflx) - RPR - Cervicovaginal ancillary only( [place])  3. BMI 27.0-27.9,adult  4.  Vaginal discharge and possibly recurrent BV Will watch lab results and prescribe treatment if needed.  - medroxyPROGESTERone (DEPO-PROVERA) 150 MG/ML injection; Inject 1 mL (150 mg total) into the muscle every 3 (three) months.  Dispense: 1 mL; Refill: 4 - medroxyPROGESTERone (DEPO-PROVERA) injection 150 mg - POCT urine pregnancy   Routine preventative health maintenance measures emphasized. Please refer to After Visit Summary for other counseling recommendations.   Return in about 3 months (around 06/09/2018).   Total face-to-face time with patient: 15 minutes.  Over 50% of encounter was spent on counseling and coordination of care.  Nolene Bernheim, RN, MSN, NP-BC Nurse Practitioner, [facility] for [employer], [facility] 03/11/2018 11:41 AM

## 2018-03-11 NOTE — Progress Notes (Signed)
Pt has had BV confirmed twice in the past two months and treated both times, she feels she is having the same symptoms currently (odor and discharge). Last treatment was Metronidazole gel, finished the rx two weeks ago. Pt states she has had a new partner and requests STI testing. Depo restart today, UPT neg. Provider advised giving depo injection today, depo injection given in RUOQ, pt tolerated well.

## 2018-03-12 LAB — CERVICOVAGINAL ANCILLARY ONLY
Bacterial vaginitis: POSITIVE — AB
CANDIDA VAGINITIS: NEGATIVE
Chlamydia: NEGATIVE
NEISSERIA GONORRHEA: NEGATIVE
TRICH (WINDOWPATH): NEGATIVE

## 2018-03-18 MED ORDER — METRONIDAZOLE 500 MG PO TABS: 500.0000 mg | ORAL_TABLET | Freq: Two times a day (BID) | ORAL | 0 refills | Status: DC

## 2018-03-18 MED ORDER — METRONIDAZOLE 0.75 % VA GEL: VAGINAL | 3 refills | Status: DC

## 2018-03-18 NOTE — Addendum Note (Signed)
Addended by: Currie Paris on: 03/18/2018 08:01 AM   Modules accepted: Orders

## 2018-03-18 NOTE — Progress Notes (Signed)
Recurrent BV.  Will treat with Metronidazole 500 mg PO BID x 10 days and then afterwards Metrogel vaginally twice weekly for 2 months.  Mychart message sent to client and meds sent to her pharmacy.  Nolene Bernheim, RN, MSN, NP-BC Nurse Practitioner, [facility] for [employer], [facility] 03/18/2018 8:01 AM

## 2018-06-09 ENCOUNTER — Ambulatory Visit: Payer: Medicaid Other

## 2018-06-16 ENCOUNTER — Telehealth: Payer: Self-pay | Admitting: Obstetrics

## 2018-07-13 ENCOUNTER — Other Ambulatory Visit: Payer: Self-pay

## 2018-07-13 ENCOUNTER — Ambulatory Visit (INDEPENDENT_AMBULATORY_CARE_PROVIDER_SITE_OTHER): Payer: Medicaid Other

## 2018-07-13 DIAGNOSIS — Z3042 Encounter for surveillance of injectable contraceptive: Secondary | ICD-10-CM

## 2018-07-13 DIAGNOSIS — Z3202 Encounter for pregnancy test, result negative: Secondary | ICD-10-CM

## 2018-07-13 LAB — POCT URINE PREGNANCY: Preg Test, Ur: NEGATIVE

## 2018-07-13 MED ORDER — MEDROXYPROGESTERONE ACETATE 150 MG/ML IM SUSP: 150.0000 mg | INTRAMUSCULAR | 0 refills | Status: DC

## 2018-07-13 NOTE — Progress Notes (Signed)
Nurse visit for iniital Depo restart Pt informed to abstain from IC x 14 days RTO in 2 wks with Depo and if UPT neg, we will administer inj. Depo sent to the pharmacy for pt to bring with her NV.

## 2018-07-16 NOTE — Progress Notes (Signed)
[name], female   DOB: 11/22/1990, 28 y.o.   MRN: [id number] I have reviewed the chart and agree with nursing staff's documentation of this patient's encounter.  Emeterio Reeve, MD 07/16/2018 10:31 AM

## 2018-07-27 ENCOUNTER — Ambulatory Visit: Payer: Medicaid Other

## 2018-07-27 ENCOUNTER — Other Ambulatory Visit: Payer: Self-pay

## 2018-07-27 MED ORDER — MEDROXYPROGESTERONE ACETATE 150 MG/ML IM SUSP: 150.0000 mg | INTRAMUSCULAR | 3 refills | Status: DC

## 2018-07-27 NOTE — Progress Notes (Signed)
DEPO RX sent to pharmacy per patient request.

## 2018-07-29 ENCOUNTER — Ambulatory Visit: Payer: Medicaid Other

## 2018-07-30 ENCOUNTER — Telehealth: Payer: Self-pay | Admitting: Obstetrics

## 2018-09-28 ENCOUNTER — Other Ambulatory Visit: Payer: Self-pay

## 2018-09-28 DIAGNOSIS — N760 Acute vaginitis: Secondary | ICD-10-CM

## 2018-09-28 DIAGNOSIS — B9689 Other specified bacterial agents as the cause of diseases classified elsewhere: Secondary | ICD-10-CM

## 2018-09-28 MED ORDER — METRONIDAZOLE 500 MG PO TABS: 500.0000 mg | ORAL_TABLET | Freq: Two times a day (BID) | ORAL | 0 refills | Status: DC

## 2018-09-28 NOTE — Progress Notes (Signed)
Rx sent per protocol Pt made aware.    

## 2019-01-15 NOTE — L&D Delivery Note (Signed)
OB/GYN Faculty Practice Delivery Note  Chelsea Wallace is a 29 y.o. [id number] s/p vaginal delivery at [redacted]w[redacted]d. She was admitted for elective IOL.   ROM: 4h 36m with clear fluid GBS Status: negative Maximum Maternal Temperature: 98.63F  Labor Progress: Pt initially received cytotec on admission and was then transitioned to pitocin with good progress. She had SROM for clear fluid on 10/11 at 0330. She delivered after brief second stage as noted below.  Delivery Date/Time: [id number] on 10/25/19 Delivery: Called to room and patient was complete and pushing. Head delivered LOA. Tight nuchal cord present x1 that was reduced prior to delivery. Shoulder dystocia x15 seconds that resolved s/p McRoberts and suprapubic pressure. Infant with spontaneous cry, placed on mother's abdomen, dried and stimulated. Cord clamped x 2 after 1-minute delay, and cut by FOB under my direct supervision. Cord blood drawn. Placenta delivered spontaneously with gentle cord traction. Fundus firm with massage and Pitocin. Labia, perineum, vagina, and cervix were inspected; no evidence of lacerations.   Placenta: intact, 3-vessel cord, sent to L&D Complications: shoulder dystocia x15 seconds as noted above Lacerations: none EBL: 178 ml Analgesia: epidural  Infant: female   APGARs 8 & 9   4035g  Post-Placental IUD Insertion Procedure Note  Patient identified, informed consent signed prior to delivery, signed copy in chart, time out was performed.  Vaginal, labial and perineal areas thoroughly inspected for lacerations. No lacerations present.  - Liletta IUD grasped between sterile gloved fingers. Sterile lubrication applied to sterile gloved hand for ease of insertion. Fundus identified through abdominal wall using non-insertion hand. IUD inserted to fundus with bimanual technique. IUD carefully released at the fundus and insertion hand gently removed from vagina.   Strings trimmed to the level of the introitus. Patient  tolerated procedure well.  Patient given post procedure instructions and IUD care card with expiration date.  Patient is asked to keep IUD strings tucked in her vagina until her postpartum follow up visit in 4-6 weeks. Patient advised to abstain from sexual intercourse and pulling on strings before her follow-up visit. Patient verbalized an understanding of the plan of care and agrees.   Lynnda Shields, MD OB/GYN Fellow, Faculty Practice

## 2019-01-18 ENCOUNTER — Other Ambulatory Visit: Payer: Self-pay | Admitting: Nurse Practitioner

## 2019-02-19 ENCOUNTER — Inpatient Hospital Stay (HOSPITAL_COMMUNITY)
Admission: AD | Admit: 2019-02-19 | Discharge: 2019-02-19 | Disposition: A | Discharge status: Home / Self Care | Payer: Medicaid Other | Attending: Obstetrics & Gynecology | Admitting: Obstetrics & Gynecology

## 2019-02-19 ENCOUNTER — Ambulatory Visit (INDEPENDENT_AMBULATORY_CARE_PROVIDER_SITE_OTHER): Payer: Medicaid Other | Admitting: *Deleted

## 2019-02-19 ENCOUNTER — Encounter: Payer: Self-pay | Admitting: General Practice

## 2019-02-19 ENCOUNTER — Encounter (HOSPITAL_COMMUNITY): Payer: Self-pay | Admitting: Obstetrics & Gynecology

## 2019-02-19 ENCOUNTER — Other Ambulatory Visit: Payer: Self-pay

## 2019-02-19 ENCOUNTER — Inpatient Hospital Stay (HOSPITAL_COMMUNITY): Payer: Medicaid Other

## 2019-02-19 VITALS — BP 108/71 | HR 83 | Temp 97.7°F | Ht 65.0 in | Wt 175.0 lb

## 2019-02-19 DIAGNOSIS — Z3A01 Less than 8 weeks gestation of pregnancy: Secondary | ICD-10-CM | POA: Insufficient documentation

## 2019-02-19 DIAGNOSIS — O26891 Other specified pregnancy related conditions, first trimester: Secondary | ICD-10-CM | POA: Insufficient documentation

## 2019-02-19 DIAGNOSIS — Z3201 Encounter for pregnancy test, result positive: Secondary | ICD-10-CM | POA: Diagnosis not present

## 2019-02-19 DIAGNOSIS — Z3200 Encounter for pregnancy test, result unknown: Secondary | ICD-10-CM

## 2019-02-19 DIAGNOSIS — O3680X0 Pregnancy with inconclusive fetal viability, not applicable or unspecified: Secondary | ICD-10-CM | POA: Diagnosis not present

## 2019-02-19 DIAGNOSIS — R102 Pelvic and perineal pain: Secondary | ICD-10-CM | POA: Insufficient documentation

## 2019-02-19 DIAGNOSIS — Z6740 Type O blood, Rh positive: Secondary | ICD-10-CM

## 2019-02-19 LAB — CBC
HCT: 37.6 % (ref 36.0–46.0)
Hemoglobin: 12.4 g/dL (ref 12.0–15.0)
MCH: 30.8 pg (ref 26.0–34.0)
MCHC: 33 g/dL (ref 30.0–36.0)
MCV: 93.3 fL (ref 80.0–100.0)
Platelets: 175 10*3/uL (ref 150–400)
RBC: 4.03 MIL/uL (ref 3.87–5.11)
RDW: 12.9 % (ref 11.5–15.5)
WBC: 4.4 10*3/uL (ref 4.0–10.5)
nRBC: 0 % (ref 0.0–0.2)

## 2019-02-19 LAB — WET PREP, GENITAL
Clue Cells Wet Prep HPF POC: NONE SEEN
Sperm: NONE SEEN
Trich, Wet Prep: NONE SEEN
Yeast Wet Prep HPF POC: NONE SEEN

## 2019-02-19 LAB — POCT URINE PREGNANCY: Preg Test, Ur: POSITIVE — AB

## 2019-02-19 LAB — URINALYSIS, ROUTINE W REFLEX MICROSCOPIC
Bilirubin Urine: NEGATIVE
Glucose, UA: NEGATIVE mg/dL
Hgb urine dipstick: NEGATIVE
Ketones, ur: NEGATIVE mg/dL
Nitrite: NEGATIVE
Protein, ur: NEGATIVE mg/dL
Specific Gravity, Urine: 1.026 (ref 1.005–1.030)
pH: 7 (ref 5.0–8.0)

## 2019-02-19 LAB — HIV ANTIBODY (ROUTINE TESTING W REFLEX): HIV Screen 4th Generation wRfx: NONREACTIVE

## 2019-02-19 LAB — HCG, QUANTITATIVE, PREGNANCY: hCG, Beta Chain, Quant, S: 1545 m[IU]/mL — ABNORMAL HIGH (ref <5)

## 2019-02-19 NOTE — Discharge Instructions (Signed)

## 2019-02-19 NOTE — MAU Provider Note (Addendum)
History     CSN: [id number]  Arrival date and time: 02/19/19 1110   None     Chief Complaint  Patient presents with  . Vaginal Bleeding  . Pelvic Pain   Chelsea Wallace is a 29 y.o. G4P3003 at [redacted]w[redacted]d who presents today with spotting and cramping.   Vaginal Bleeding The patient's primary symptoms include pelvic pain and vaginal bleeding. The patient's pertinent negatives include no vaginal discharge. This is a new problem. The current episode started in the past 7 days. The problem occurs intermittently. The problem has been unchanged. The problem affects both sides. She is pregnant. Pertinent negatives include no chills, dysuria, fever, frequency, nausea or vomiting. The vaginal discharge was bloody. The vaginal bleeding is spotting (had some heavier bleeding last week, but now only spotting. ). Nothing aggravates the symptoms. She has tried nothing for the symptoms. She uses nothing for contraception. Her menstrual history has been irregular (LMP 01/11/2019).  Pelvic Pain The patient's primary symptoms include pelvic pain and vaginal bleeding. The patient's pertinent negatives include no vaginal discharge. Pertinent negatives include no chills, dysuria, fever, frequency, nausea or vomiting.    OB History    Gravida  4   Para  3   Term  3   Preterm  0   AB  0   Living  3     SAB  0   TAB  0   Ectopic  0   Multiple  0   Live Births  3           Past Medical History:  Diagnosis Date  . GERD (gastroesophageal reflux disease)    on prescription for it; pt cannot remember name of med.  . Medical history non-contributory     Past Surgical History:  Procedure Laterality Date  . MANDIBLE RECONSTRUCTION     lower jaw surgery 2011  . MANDIBLE SURGERY Bilateral    age 27 or 65, manibles fx to adjust bite    Family History  Problem Relation Age of Onset  . Diabetes Mother   . Diabetes Maternal Grandfather   . Hypertension Paternal Grandmother   . Hypertension  Paternal Grandfather     Social History   Tobacco Use  . Smoking status: Never Smoker  . Smokeless tobacco: Never Used  Substance Use Topics  . Alcohol use: No  . Drug use: No    Allergies: No Known Allergies  No medications prior to admission.    Review of Systems  Constitutional: Negative for chills and fever.  Gastrointestinal: Negative for nausea and vomiting.  Genitourinary: Positive for pelvic pain and vaginal bleeding. Negative for dysuria, frequency and vaginal discharge.   Physical Exam   Blood pressure 123/79, pulse 89, temperature 99 F (37.2 C), resp. rate 16, height 5\' 5"  (1.651 m), weight 81.1 kg, last menstrual period 01/11/2019, SpO2 100 %.  Physical Exam  Nursing note and vitals reviewed. Constitutional: She is oriented to person, place, and time. She appears well-developed and well-nourished. No distress.  HENT:  Head: Normocephalic.  Cardiovascular: Normal rate.  Respiratory: Effort normal.  GI: Soft. There is no abdominal tenderness.  Neurological: She is alert and oriented to person, place, and time.  Skin: Skin is warm and dry.  Psychiatric: She has a normal mood and affect.     Results for orders placed or performed during the hospital encounter of 02/19/19 (from the past 24 hour(s))  CBC     Status: None   Collection Time: 02/19/19  12:43 PM  Result Value Ref Range   WBC 4.4 4.0 - 10.5 K/uL   RBC 4.03 3.87 - 5.11 MIL/uL   Hemoglobin 12.4 12.0 - 15.0 g/dL   HCT 26.3 33.5 - 45.6 %   MCV 93.3 80.0 - 100.0 fL   MCH 30.8 26.0 - 34.0 pg   MCHC 33.0 30.0 - 36.0 g/dL   RDW 25.6 38.9 - 37.3 %   Platelets 175 150 - 400 K/uL   nRBC 0.0 0.0 - 0.2 %  hCG, quantitative, pregnancy     Status: Abnormal   Collection Time: 02/19/19 12:43 PM  Result Value Ref Range   hCG, Beta Chain, Quant, S 1,545 (H) <5 mIU/mL  HIV Antibody (routine testing w rflx)     Status: None   Collection Time: 02/19/19 12:43 PM  Result Value Ref Range   HIV Screen 4th  Generation wRfx NON REACTIVE NON REACTIVE  Urinalysis, Routine w reflex microscopic     Status: Abnormal   Collection Time: 02/19/19  1:00 PM  Result Value Ref Range   Color, Urine YELLOW YELLOW   APPearance HAZY (A) CLEAR   Specific Gravity, Urine 1.026 1.005 - 1.030   pH 7.0 5.0 - 8.0   Glucose, UA NEGATIVE NEGATIVE mg/dL   Hgb urine dipstick NEGATIVE NEGATIVE   Bilirubin Urine NEGATIVE NEGATIVE   Ketones, ur NEGATIVE NEGATIVE mg/dL   Protein, ur NEGATIVE NEGATIVE mg/dL   Nitrite NEGATIVE NEGATIVE   Leukocytes,Ua TRACE (A) NEGATIVE   RBC / HPF 0-5 0 - 5 RBC/hpf   WBC, UA 0-5 0 - 5 WBC/hpf   Bacteria, UA RARE (A) NONE SEEN   Squamous Epithelial / LPF 0-5 0 - 5   Mucus PRESENT   Wet prep, genital     Status: Abnormal   Collection Time: 02/19/19  2:36 PM   Specimen: Vaginal  Result Value Ref Range   Yeast Wet Prep HPF POC NONE SEEN NONE SEEN   Trich, Wet Prep NONE SEEN NONE SEEN   Clue Cells Wet Prep HPF POC NONE SEEN NONE SEEN   WBC, Wet Prep HPF POC MANY (A) NONE SEEN   Sperm NONE SEEN    US OB LESS THAN 14 WEEKS WITH OB TRANSVAGINAL  Result Date: 02/19/2019 CLINICAL DATA:  Bleeding, 1 week of cramping, beta HCG 1545 EXAM: OBSTETRIC <14 WK [place] AND TRANSVAGINAL OB US DOPPLER ULTRASOUND OF OVARIES TECHNIQUE: Both transabdominal and transvaginal ultrasound examinations were performed for complete evaluation of the gestation as well as the maternal uterus, adnexal regions, and pelvic cul-de-sac. Transvaginal technique was performed to assess early pregnancy. Color and duplex Doppler ultrasound was utilized to evaluate blood flow to the ovaries. COMPARISON:  Prior gestational ultrasound 11/05/2016 FINDINGS: Intrauterine gestational sac: Single Yolk sac:  Not Visualized. Embryo:  Not Visualized. Cardiac Activity: Not Visualized. MSD: 3.12 mm   5 w   0 d Subchorionic hemorrhage: Small volume crescentic low-attenuation subchorionic hemorrhage adjacent the gestational sac (image 19). Maternal  uterus/adnexae: Question a tiny 2 mm subendometrial cyst towards the lower uterine segment. Maternal uterus is otherwise unremarkable. Corpus luteum cyst noted in the left ovary. Right ovary is unremarkable. Pulsed Doppler evaluation of both ovaries demonstrates normal appearing low-resistance arterial and venous waveforms. IMPRESSION: Single intrauterine gestational sac is visualized with small volume subchorionic hemorrhage. Nonvisualization of the yolk sac or embryo, possibly related to early dates (5 weeks, 0 days by MSD measurement). Recommend short-term interval follow-up ultrasound and serial quantitative beta HCG. Tiny subendometrial cystic structure  in the lower uterine segment, nonspecific, recommend attention on continued follow-up. Electronically Signed   By: Lovena Le M.D.   On: 02/19/2019 16:26    MAU Course  Procedures  MDM   Assessment and Plan   1. Pregnancy of unknown anatomic location   2. Pelvic pain in pregnancy, antepartum, first trimester   3. [redacted] weeks gestation of pregnancy   4. Type O blood, Rh positive    DC home 1st Trimester precautions  Bleeding precautions Ectopic precautions RX: none Return to MAU as needed Repeat HCG in clinic on Monday   [place] for [facility] Follow up.   Specialty: Obstetrics and Gynecology Why: Monday 02/22/19 at 9:00am for repeat blood work  Sport and exercise psychologist information: [address] [place], [place] [id number] [place] [id number] [place], [place]  02/19/19  4:39 PM

## 2019-02-19 NOTE — MAU Note (Signed)
3 +HPT, + test at clinic this morning.  Hx of irreg cycles, had bleeding last wk, spotting since then. Has been cramping. Sent over for further eval.

## 2019-02-19 NOTE — Progress Notes (Signed)
   Ms. Bolla presents today for UPT. She has no unusual complaints. LMP: Unsure of last menstrual cycle. Patient reported that menses are irregular.    OBJECTIVE: Appears well, in no apparent distress.  OB History    Gravida  3   Para  3   Term  3   Preterm  0   AB  0   Living  3     SAB  0   TAB  0   Ectopic  0   Multiple  0   Live Births  3          Home UPT Result: Positive In-Office UPT result: Positive I have reviewed the patient's medical, obstetrical, social, and family histories, and medications.   ASSESSMENT: Positive pregnancy test  PLAN Prenatal care to be completed at: [facility], Bronson Ing, RN

## 2019-02-22 ENCOUNTER — Other Ambulatory Visit: Payer: Self-pay

## 2019-02-22 ENCOUNTER — Other Ambulatory Visit (INDEPENDENT_AMBULATORY_CARE_PROVIDER_SITE_OTHER): Payer: Medicaid Other

## 2019-02-22 DIAGNOSIS — Z3A00 Weeks of gestation of pregnancy not specified: Secondary | ICD-10-CM

## 2019-02-22 DIAGNOSIS — O3680X0 Pregnancy with inconclusive fetal viability, not applicable or unspecified: Secondary | ICD-10-CM

## 2019-02-22 LAB — GC/CHLAMYDIA PROBE AMP (~~LOC~~) NOT AT ARMC
Chlamydia: NEGATIVE
Comment: NEGATIVE
Comment: NORMAL
Neisseria Gonorrhea: NEGATIVE

## 2019-02-22 LAB — BETA HCG QUANT (REF LAB): hCG Quant: 3026 m[IU]/mL

## 2019-02-22 NOTE — Progress Notes (Signed)
Per chart review pt is here following visit to MAU on 02/19/19 with abdominal cramping and spotting during pregnancy. [place] completed at that visit, did not confirm IUP. Pt states today that she did not ever experience any pain or bleeding. Pt unsure of need for follow up. Explained that pt is here for a follow up stat beta HCG lab because the location of her pregnancy has not been confirmed. Lab drawn. Explained to pt I will call with results and further plan of care later this AM after discussing with a provider. Pt denies any bleeding or pain today. Ectopic precautions reviewed.  Beta HCG result today is 3026 increased from 1545 on 02/19/19. Results reviewed with Shawnie Pons, MD who states pt needs repeat stat beta HCG on Wednesday.   Called pt; results and provider recommendation given. Ectopic precautions reviewed. Front office notified to schedule pt for stat beta on Wednesday.   Fleet Contras RN 02/22/19

## 2019-02-22 NOTE — Progress Notes (Signed)
Patient seen and assessed by nursing staff.  Agree with documentation and plan.  

## 2019-02-24 ENCOUNTER — Encounter: Payer: Self-pay | Admitting: Family Medicine

## 2019-02-24 ENCOUNTER — Ambulatory Visit (INDEPENDENT_AMBULATORY_CARE_PROVIDER_SITE_OTHER): Payer: Medicaid Other | Admitting: *Deleted

## 2019-02-24 ENCOUNTER — Other Ambulatory Visit: Payer: Self-pay

## 2019-02-24 VITALS — BP 113/61 | HR 82

## 2019-02-24 DIAGNOSIS — R102 Pelvic and perineal pain: Secondary | ICD-10-CM

## 2019-02-24 DIAGNOSIS — O3680X0 Pregnancy with inconclusive fetal viability, not applicable or unspecified: Secondary | ICD-10-CM

## 2019-02-24 DIAGNOSIS — O26891 Other specified pregnancy related conditions, first trimester: Secondary | ICD-10-CM

## 2019-02-24 LAB — BETA HCG QUANT (REF LAB): hCG Quant: 5305 m[IU]/mL

## 2019-02-24 NOTE — Progress Notes (Signed)
Pt presents for stat BHCG as scheduled. She reports having no bleeding or abdominal cramping. Lab drawn and pt will be notified later today  1120  Pt was called with BHCG results - 5305 which is appropriate rise in hormone level. Per Dr. Vergie Living, pt will be scheduled for [place] in 2 weeks to check viability.She will be notified of [place] appt via MyChart. Pt voiced understanding and agreed to plan of care.

## 2019-03-01 ENCOUNTER — Encounter: Payer: Self-pay | Admitting: *Deleted

## 2019-03-01 ENCOUNTER — Telehealth: Payer: Self-pay | Admitting: *Deleted

## 2019-03-01 NOTE — Progress Notes (Signed)
Patient seen and assessed by nursing staff during this encounter. I have reviewed the chart and agree with the documentation and plan.  [place] Bing, MD 03/01/2019 12:02 PM

## 2019-03-02 NOTE — Telephone Encounter (Signed)
Phone call to pt. to f/u on her inquiry from 2/15, about COVID test results from 02/24/19.  She reported that she and her 3 children were tested on 02/24/19, at the [facility] site.  Stated she needs a note for her Employer, showing that she had gone for testing.  Advised the pt. that there are no appts. for her, or her children, showing that she was tested on that date.  Asked pt. if she could have possibly gone for testing outside of [place]?  The pt. Stated she was at the site of the "old [facility]" about 1:00 PM on that date.   Spoke with the Site Lead, at [employer].  She reviewed the patient list from 02/24/19.  She stated there is no record of the patient, or her children being checked in, or tested on that date.  Per Site Lead, due to their workflow for  identifying, labeling, and moving patients through the different points in the testing process, it is unlikely that they would have made an error in processing all four family members.  She was unable to confirm that the patient and her children were at the Testing Site on 02/24/19.    Called patient.  Unable to reach at this time.  Left vm that the PEC is not able to provide a letter to support that she had gone for COVID testing on 02/24/19.  Advised to return call to [phone] if further questions.

## 2019-03-15 ENCOUNTER — Ambulatory Visit (HOSPITAL_COMMUNITY): Payer: Medicaid Other

## 2019-03-19 ENCOUNTER — Ambulatory Visit: Payer: Medicaid Other

## 2019-03-19 ENCOUNTER — Other Ambulatory Visit: Payer: Self-pay

## 2019-03-19 ENCOUNTER — Ambulatory Visit (HOSPITAL_COMMUNITY)
Admission: RE | Admit: 2019-03-19 | Discharge: 2019-03-19 | Disposition: A | Discharge status: Home / Self Care | Payer: Medicaid Other | Source: Ambulatory Visit | Attending: Obstetrics and Gynecology | Admitting: Obstetrics and Gynecology

## 2019-03-19 ENCOUNTER — Encounter: Payer: Self-pay | Admitting: *Deleted

## 2019-03-19 DIAGNOSIS — O3680X0 Pregnancy with inconclusive fetal viability, not applicable or unspecified: Secondary | ICD-10-CM | POA: Diagnosis present

## 2019-03-19 DIAGNOSIS — O26891 Other specified pregnancy related conditions, first trimester: Secondary | ICD-10-CM | POA: Insufficient documentation

## 2019-03-19 DIAGNOSIS — R102 Pelvic and perineal pain: Secondary | ICD-10-CM | POA: Diagnosis present

## 2019-03-31 ENCOUNTER — Ambulatory Visit: Payer: Medicaid Other

## 2019-03-31 DIAGNOSIS — Z3480 Encounter for supervision of other normal pregnancy, unspecified trimester: Secondary | ICD-10-CM

## 2019-04-09 ENCOUNTER — Other Ambulatory Visit (HOSPITAL_COMMUNITY)
Admission: RE | Admit: 2019-04-09 | Discharge: 2019-04-09 | Disposition: A | Discharge status: Home / Self Care | Payer: Medicaid Other | Source: Ambulatory Visit | Attending: Obstetrics | Admitting: Obstetrics

## 2019-04-09 ENCOUNTER — Ambulatory Visit (INDEPENDENT_AMBULATORY_CARE_PROVIDER_SITE_OTHER): Payer: Medicaid Other | Admitting: Obstetrics

## 2019-04-09 ENCOUNTER — Encounter: Payer: Self-pay | Admitting: Obstetrics

## 2019-04-09 ENCOUNTER — Other Ambulatory Visit: Payer: Self-pay

## 2019-04-09 VITALS — BP 118/78 | HR 92 | Wt 178.0 lb

## 2019-04-09 DIAGNOSIS — Z3401 Encounter for supervision of normal first pregnancy, first trimester: Secondary | ICD-10-CM | POA: Insufficient documentation

## 2019-04-09 DIAGNOSIS — Z3A12 12 weeks gestation of pregnancy: Secondary | ICD-10-CM | POA: Diagnosis not present

## 2019-04-09 DIAGNOSIS — Z3481 Encounter for supervision of other normal pregnancy, first trimester: Secondary | ICD-10-CM

## 2019-04-09 DIAGNOSIS — Z3480 Encounter for supervision of other normal pregnancy, unspecified trimester: Secondary | ICD-10-CM | POA: Insufficient documentation

## 2019-04-09 MED ORDER — PRENATE MINI 29-0.6-0.4-350 MG PO CAPS: 1.0000 | ORAL_CAPSULE | Freq: Every day | ORAL | 4 refills | Status: DC

## 2019-04-09 MED ORDER — BLOOD PRESSURE KIT: 1.0000 | PACK | Freq: Once | 0 refills | Status: DC

## 2019-04-09 NOTE — Progress Notes (Signed)
Subjective:    Chelsea Wallace is being seen today for her first obstetrical visit.  This is not a planned pregnancy. She is at 36w4dgestation. Her obstetrical history is significant for none. Relationship with FOB: significant other, not living together. Patient does intend to breast feed. Pregnancy history fully reviewed.  The information documented in the HPI was reviewed and verified.  Menstrual History: OB History    Gravida  4   Para  3   Term  3   Preterm  0   AB  0   Living  3     SAB  0   TAB  0   Ectopic  0   Multiple  0   Live Births  3            Patient's last menstrual period was 01/11/2019 (approximate).    Past Medical History:  Diagnosis Date  . GERD (gastroesophageal reflux disease)    on prescription for it; pt cannot remember name of med.  . Medical history non-contributory     Past Surgical History:  Procedure Laterality Date  . MANDIBLE RECONSTRUCTION     lower jaw surgery 2011  . MANDIBLE SURGERY Bilateral    age [age] manibles fx to adjust bite    (Not in a hospital admission)  No Known Allergies  Social History   Tobacco Use  . Smoking status: Never Smoker  . Smokeless tobacco: Never Used  Substance Use Topics  . Alcohol use: No    Family History  Problem Relation Age of Onset  . Diabetes Mother   . Diabetes Maternal Grandfather   . Hypertension Paternal Grandmother   . Hypertension Paternal Grandfather      Review of Systems Constitutional: negative for weight loss Gastrointestinal: negative for vomiting Genitourinary:negative for genital lesions and vaginal discharge and dysuria Musculoskeletal:negative for back pain Behavioral/Psych: negative for abusive relationship, depression, illegal drug usage and tobacco use    Objective:    BP 118/78   Pulse 92   Wt 178 lb (80.7 kg)   LMP 01/11/2019 (Approximate)   BMI 29.62 kg/m  General Appearance:    Alert, cooperative, no distress, appears stated age   Head:    Normocephalic, without obvious abnormality, atraumatic  Eyes:    PERRL, conjunctiva/corneas clear, EOM's intact, fundi    benign, both eyes  Ears:    Normal TM's and external ear canals, both ears  Nose:   Nares normal, septum midline, mucosa normal, no drainage    or sinus tenderness  Throat:   Lips, mucosa, and tongue normal; teeth and gums normal  Neck:   Supple, symmetrical, trachea midline, no adenopathy;    thyroid:  no enlargement/tenderness/nodules; no carotid   bruit or JVD  Back:     Symmetric, no curvature, ROM normal, no CVA tenderness  Lungs:     Clear to auscultation bilaterally, respirations unlabored  Chest Wall:    No tenderness or deformity   Heart:    Regular rate and rhythm, S1 and S2 normal, no murmur, rub   or gallop  Breast Exam:    No tenderness, masses, or nipple abnormality  Abdomen:     Soft, non-tender, bowel sounds active all four quadrants,    no masses, no organomegaly  Genitalia:    Normal female without lesion, discharge or tenderness  Extremities:   Extremities normal, atraumatic, no cyanosis or edema  Pulses:   2+ and symmetric all extremities  Skin:   Skin  color, texture, turgor normal, no rashes or lesions  Lymph nodes:   Cervical, supraclavicular, and axillary nodes normal  Neurologic:   CNII-XII intact, normal strength, sensation and reflexes    throughout      Lab Review Urine pregnancy test Labs reviewed yes Radiologic studies reviewed no  Assessment:    Pregnancy at 49w4dweeks    Plan:     1. Encounter for supervision of normal first pregnancy in first trimester Rx: - Cytology - PAP - Cervicovaginal ancillary only - Blood Pressure KIT; 1 kit by Does not apply route once for 1 dose.  Dispense: 1 kit; Refill: 0 - Babyscripts Schedule Optimization - Urine Culture - Obstetric Panel, Including HIV - [place]OB Comp Less 14 Wks; Future - [place]MFM OB DETAIL +14 WK; Future - Prenat w/o A-FeCbn-Meth-FA-DHA (PRENATE MINI)  29-0.6-0.4-350 MG CAPS; Take 1 capsule by mouth daily before breakfast.  Dispense: 90 capsule; Refill: 4  Prenatal vitamins.  Counseling provided regarding continued use of seat belts, cessation of alcohol consumption, smoking or use of illicit drugs; infection precautions i.e., influenza/TDAP immunizations, toxoplasmosis,CMV, parvovirus, listeria and varicella; workplace safety, exercise during pregnancy; routine dental care, safe medications, sexual activity, hot tubs, saunas, pools, travel, caffeine use, fish and methlymercury, potential toxins, hair treatments, varicose veins Weight gain recommendations per IOM guidelines reviewed: underweight/BMI< 18.5--> gain 28 - 40 lbs; normal weight/BMI 18.5 - 24.9--> gain 25 - 35 lbs; overweight/BMI 25 - 29.9--> gain 15 - 25 lbs; obese/BMI >30->gain  11 - 20 lbs Problem list reviewed and updated. FIRST/CF mutation testing/NIPT/QUAD SCREEN/fragile X/Ashkenazi Jewish population testing/Spinal muscular atrophy discussed: requested. Role of ultrasound in pregnancy discussed; fetal survey: requested. Amniocentesis discussed: not indicated   Meds ordered this encounter  Medications  . Blood Pressure KIT    Sig: 1 kit by Does not apply route once for 1 dose.    Dispense:  1 kit    Refill:  0   Orders Placed This Encounter  Procedures  . Urine Culture  . UK[place]OB Comp Less 14 Wks    Standing Status:   Future    Standing Expiration Date:   06/08/2020    Order Specific Question:   Reason for Exam (SYMPTOM  OR DIAGNOSIS REQUIRED)    Answer:   Unsure LMP.    Order Specific Question:   [facility]erred [facility]    [facility]  . UKoreaMFM OB DETAIL +14 WK    Standing Status:   Future    Standing Expiration Date:   06/08/2020    Order Specific Question:   Reason for Exam (SYMPTOM  OR DIAGNOSIS REQUIRED)    Answer:   Anatomy    Order Specific Question:   Preferred Location    Answer[facility] .  Obstetric Panel, Including HIV    Follow up in 4 weeks. 50% of 25 min visit spent on counseling and coordination of care.    HShelly Bombard MD 04/09/2019 11:24 AM

## 2019-04-10 LAB — OBSTETRIC PANEL, INCLUDING HIV
Antibody Screen: NEGATIVE
Basophils Absolute: 0 10*3/uL (ref 0.0–0.2)
Basos: 0 %
EOS (ABSOLUTE): 0 10*3/uL (ref 0.0–0.4)
Eos: 0 %
HIV Screen 4th Generation wRfx: NONREACTIVE
Hematocrit: 37 % (ref 34.0–46.6)
Hemoglobin: 12.6 g/dL (ref 11.1–15.9)
Hepatitis B Surface Ag: NEGATIVE
Immature Grans (Abs): 0 10*3/uL (ref 0.0–0.1)
Immature Granulocytes: 0 %
Lymphocytes Absolute: 1.6 10*3/uL (ref 0.7–3.1)
Lymphs: 34 %
MCH: 31.4 pg (ref 26.6–33.0)
MCHC: 34.1 g/dL (ref 31.5–35.7)
MCV: 92 fL (ref 79–97)
Monocytes Absolute: 0.4 10*3/uL (ref 0.1–0.9)
Monocytes: 8 %
Neutrophils Absolute: 2.6 10*3/uL (ref 1.4–7.0)
Neutrophils: 58 %
Platelets: 164 10*3/uL (ref 150–450)
RBC: 4.01 x10E6/uL (ref 3.77–5.28)
RDW: 12.8 % (ref 11.7–15.4)
RPR Ser Ql: NONREACTIVE
Rh Factor: POSITIVE
Rubella Antibodies, IGG: 5.85 index (ref >0.99)
WBC: 4.7 10*3/uL (ref 3.4–10.8)

## 2019-04-11 LAB — URINE CULTURE

## 2019-04-12 LAB — CERVICOVAGINAL ANCILLARY ONLY
Bacterial Vaginitis (gardnerella): NEGATIVE
Candida Glabrata: NEGATIVE
Candida Vaginitis: NEGATIVE
Chlamydia: NEGATIVE
Comment: NEGATIVE
Comment: NEGATIVE
Comment: NEGATIVE
Comment: NEGATIVE
Comment: NEGATIVE
Comment: NORMAL
Neisseria Gonorrhea: NEGATIVE
Trichomonas: NEGATIVE

## 2019-04-21 ENCOUNTER — Encounter: Payer: Self-pay | Admitting: *Deleted

## 2019-04-26 ENCOUNTER — Encounter: Payer: Self-pay | Admitting: Obstetrics and Gynecology

## 2019-04-26 DIAGNOSIS — Z3401 Encounter for supervision of normal first pregnancy, first trimester: Secondary | ICD-10-CM

## 2019-04-27 ENCOUNTER — Telehealth: Payer: Self-pay

## 2019-04-27 NOTE — Telephone Encounter (Signed)
Returned call and answered questions about genetic testing, provided number for genetic counseling.

## 2019-04-28 ENCOUNTER — Encounter: Payer: Self-pay | Admitting: Obstetrics and Gynecology

## 2019-05-04 ENCOUNTER — Encounter: Payer: Self-pay | Admitting: Obstetrics and Gynecology

## 2019-05-07 ENCOUNTER — Encounter: Payer: Self-pay | Admitting: Obstetrics

## 2019-05-07 ENCOUNTER — Inpatient Hospital Stay (HOSPITAL_COMMUNITY)
Admission: AD | Admit: 2019-05-07 | Discharge: 2019-05-07 | Disposition: A | Discharge status: Home / Self Care | Payer: Medicaid Other | Attending: Obstetrics and Gynecology | Admitting: Obstetrics and Gynecology

## 2019-05-07 ENCOUNTER — Other Ambulatory Visit: Payer: Self-pay

## 2019-05-07 ENCOUNTER — Encounter (HOSPITAL_COMMUNITY): Payer: Self-pay | Admitting: Obstetrics and Gynecology

## 2019-05-07 ENCOUNTER — Telehealth (INDEPENDENT_AMBULATORY_CARE_PROVIDER_SITE_OTHER): Payer: Medicaid Other | Admitting: Obstetrics

## 2019-05-07 DIAGNOSIS — Z3480 Encounter for supervision of other normal pregnancy, unspecified trimester: Secondary | ICD-10-CM

## 2019-05-07 DIAGNOSIS — Z3482 Encounter for supervision of other normal pregnancy, second trimester: Secondary | ICD-10-CM

## 2019-05-07 DIAGNOSIS — R519 Headache, unspecified: Secondary | ICD-10-CM | POA: Insufficient documentation

## 2019-05-07 DIAGNOSIS — O99891 Other specified diseases and conditions complicating pregnancy: Secondary | ICD-10-CM | POA: Diagnosis not present

## 2019-05-07 DIAGNOSIS — R1030 Lower abdominal pain, unspecified: Secondary | ICD-10-CM | POA: Insufficient documentation

## 2019-05-07 DIAGNOSIS — Z3A15 15 weeks gestation of pregnancy: Secondary | ICD-10-CM | POA: Diagnosis not present

## 2019-05-07 DIAGNOSIS — R102 Pelvic and perineal pain: Secondary | ICD-10-CM | POA: Insufficient documentation

## 2019-05-07 DIAGNOSIS — O26899 Other specified pregnancy related conditions, unspecified trimester: Secondary | ICD-10-CM

## 2019-05-07 DIAGNOSIS — R109 Unspecified abdominal pain: Secondary | ICD-10-CM

## 2019-05-07 DIAGNOSIS — O26892 Other specified pregnancy related conditions, second trimester: Secondary | ICD-10-CM

## 2019-05-07 LAB — URINALYSIS, ROUTINE W REFLEX MICROSCOPIC
Bilirubin Urine: NEGATIVE
Glucose, UA: NEGATIVE mg/dL
Hgb urine dipstick: NEGATIVE
Ketones, ur: 20 mg/dL — AB
Nitrite: NEGATIVE
Protein, ur: NEGATIVE mg/dL
Specific Gravity, Urine: 1.025 (ref 1.005–1.030)
pH: 6 (ref 5.0–8.0)

## 2019-05-07 MED ORDER — BLOOD PRESSURE KIT: 1.0000 | PACK | Freq: Once | 0 refills | Status: AC

## 2019-05-07 MED ORDER — CYCLOBENZAPRINE HCL 5 MG PO TABS
10.0000 mg | ORAL_TABLET | Freq: Once | ORAL | Status: AC
  Administered 2019-05-07: 21:00:00 10 mg via ORAL
  Filled 2019-05-07: qty 2

## 2019-05-07 MED ORDER — CYCLOBENZAPRINE HCL 5 MG PO TABS: 5.0000 mg | ORAL_TABLET | Freq: Three times a day (TID) | ORAL | 0 refills | Status: DC | PRN

## 2019-05-07 MED ORDER — COMFORT FIT MATERNITY SUPP MED MISC: 1.0000 | Freq: Every day | 0 refills | Status: DC

## 2019-05-07 NOTE — Discharge Instructions (Signed)

## 2019-05-07 NOTE — MAU Provider Note (Signed)
Chief Complaint: Abdominal Pain, Back Pain, Headache, and pelvic pressure   First Provider Initiated Contact with Patient 05/07/19 2030      SUBJECTIVE HPI: Chelsea Wallace is a 29 y.o. G4P3003 at 106w6dby LMP who presents to maternity admissions reporting bilateral low abdominal pain, back pain, headache, and pelvic pressure x 2 days. She reports intermittent low abdominal cramping, every "few" minutes per pt, even when trying to rest.  The pain radiates to her low back.  Her pelvic pressure is constant and unchanged since onset.  Her headache is frontal, constant, and not improved by Tylenol.  There are no other symptoms. She has not tried any other treatments.  She denies vaginal bleeding, vaginal itching/burning, urinary symptoms, dizziness, n/v, or fever/chills.     HPI  Past Medical History:  Diagnosis Date  . GERD (gastroesophageal reflux disease)    on prescription for it; pt cannot remember name of med.  . Medical history non-contributory    Past Surgical History:  Procedure Laterality Date  . MANDIBLE RECONSTRUCTION     lower jaw surgery 2011  . MANDIBLE SURGERY Bilateral    age [age]or 89 manibles fx to adjust bite   Social History   Socioeconomic History  . Marital status: Single    Spouse name: Not on file  . Number of children: Not on file  . Years of education: Not on file  . Highest education level: Not on file  Occupational History  . Not on file  Tobacco Use  . Smoking status: Never Smoker  . Smokeless tobacco: Never Used  Substance and Sexual Activity  . Alcohol use: No  . Drug use: No  . Sexual activity: Yes    Birth control/protection: None  Other Topics Concern  . Not on file  Social History Narrative   ** Merged History Encounter **       Social Determinants of Health   Financial Resource Strain:   . Difficulty of Paying Living Expenses:   Food Insecurity:   . Worried About RCharity fundraiserin the Last Year:   . RArboriculturistin the  Last Year:   Transportation Needs:   . LFilm/video editor(Medical):   .Marland KitchenLack of Transportation (Non-Medical):   Physical Activity:   . Days of Exercise per Week:   . Minutes of Exercise per Session:   Stress:   . Feeling of Stress :   Social Connections:   . Frequency of Communication with Friends and Family:   . Frequency of Social Gatherings with Friends and Family:   . Attends Religious Services:   . Active Member of Clubs or Organizations:   . Attends CArchivistMeetings:   .Marland KitchenMarital Status:   Intimate Partner Violence:   . Fear of Current or Ex-Partner:   . Emotionally Abused:   .Marland KitchenPhysically Abused:   . Sexually Abused:    No current facility-administered medications on file prior to encounter.   Current Outpatient Medications on File Prior to Encounter  Medication Sig Dispense Refill  . Prenat w/o A-FeCbn-Meth-FA-DHA (PRENATE MINI) 29-0.6-0.4-350 MG CAPS Take 1 capsule by mouth daily before breakfast. 90 capsule 4  . Blood Pressure KIT 1 kit by Does not apply route once for 1 dose. 1 kit 0   No Known Allergies  ROS:  Review of Systems  Constitutional: Negative for chills and fever.  Respiratory: Negative for cough and shortness of breath.   Cardiovascular: Negative for chest pain.  Gastrointestinal: Positive for abdominal pain. Negative for nausea and vomiting.  Genitourinary: Negative for dysuria, frequency and urgency.  Musculoskeletal: Positive for back pain.  Neurological: Positive for headaches. Negative for dizziness.     I have reviewed patient's Past Medical Hx, Surgical Hx, Family Hx, Social Hx, medications and allergies.   Physical Exam   Patient Vitals for the past 24 hrs:  BP Temp Temp src Pulse Resp SpO2 Height Weight  05/07/19 1757 120/62 99.3 F (37.4 C) Oral (!) 105 18 100 % '5\' 5"'  (1.651 m) 81.1 kg   Constitutional: Well-developed, well-nourished female in no acute distress.  Cardiovascular: normal rate Respiratory: normal  effort GI: Abd soft, non-tender. Pos BS x 4 MS: Extremities nontender, no edema, normal ROM Neurologic: Alert and oriented x 4.  GU: Neg CVAT.  PELVIC EXAM:  Cervix closed/thick/high/posterior/firm  FHT 162 by doppler  LAB RESULTS Results for orders placed or performed during the hospital encounter of 05/07/19 (from the past 24 hour(s))  Urinalysis, Routine w reflex microscopic     Status: Abnormal   Collection Time: 05/07/19  6:23 PM  Result Value Ref Range   Color, Urine YELLOW YELLOW   APPearance HAZY (A) CLEAR   Specific Gravity, Urine 1.025 1.005 - 1.030   pH 6.0 5.0 - 8.0   Glucose, UA NEGATIVE NEGATIVE mg/dL   Hgb urine dipstick NEGATIVE NEGATIVE   Bilirubin Urine NEGATIVE NEGATIVE   Ketones, ur 20 (A) NEGATIVE mg/dL   Protein, ur NEGATIVE NEGATIVE mg/dL   Nitrite NEGATIVE NEGATIVE   Leukocytes,Ua SMALL (A) NEGATIVE   RBC / HPF 0-5 0 - 5 RBC/hpf   WBC, UA 0-5 0 - 5 WBC/hpf   Bacteria, UA RARE (A) NONE SEEN   Squamous Epithelial / LPF 6-10 0 - 5   Mucus PRESENT     O/Positive/-- (03/26 1110)  IMAGING No results found.  MAU Management/MDM: Orders Placed This Encounter  Procedures  . OB Urine Culture  . Urinalysis, Routine w reflex microscopic  . Discharge patient    Meds ordered this encounter  Medications  . cyclobenzaprine (FLEXERIL) tablet 10 mg  . Elastic Bandages & Supports (COMFORT FIT MATERNITY SUPP MED) MISC    Sig: 1 Device by Does not apply route daily.    Dispense:  1 each    Refill:  0    Order Specific Question:   Supervising Provider    Answer:   Aletha Halim [id number]  . cyclobenzaprine (FLEXERIL) 5 MG tablet    Sig: Take 1 tablet (5 mg total) by mouth 3 (three) times daily as needed for muscle spasms.    Dispense:  20 tablet    Refill:  0    Order Specific Question:   Supervising Provider    Answer:   Aletha Halim [[id number]]    No evidence of preterm labor with closed cervix.   Pain c/w round ligament pain.  Flexeril 10 mg in  MAU. Pt reports headache and pelvic pain unchanged after medication but she desires discharge from MAU.  No evidence of preterm labor, UTI, no acute abdomen on exam.  Rx for Flexeril for pt to try at home.  Rest/ice/heat/warm bath/Tylenol/pregnancy support belt. Rx for support belt.  F/U in office, return to MAU with emergencies.  Urine sent for culture.    ASSESSMENT 1. Abdominal pain during pregnancy in second trimester   2. Pain of round ligament affecting pregnancy, antepartum     PLAN Discharge home Allergies as of 05/07/2019   No Known  Allergies     Medication List    TAKE these medications   Blood Pressure Kit 1 kit by Does not apply route once for 1 dose.   Comfort Fit Maternity Supp Med Misc 1 Device by Does not apply route daily.   cyclobenzaprine 5 MG tablet Commonly known as: FLEXERIL Take 1 tablet (5 mg total) by mouth 3 (three) times daily as needed for muscle spasms.   Prenate Mini 29-0.6-0.4-350 MG Caps Take 1 capsule by mouth daily before breakfast.      Follow-up Information    [facility] Follow up.   Why: As scheduled, return to MAU as needed for emergencies Contact information: [place] [id number] [place] Certified Nurse-Midwife 05/07/2019  11:01 PM

## 2019-05-07 NOTE — MAU Note (Addendum)
Cramping in lower stomach (started this morning- told DR) and "have a lot a lot of pressure" (started earlier this afternoon.  Having a consistent HA and back pain since yesterday.  Took Tylenol, no relief.

## 2019-05-07 NOTE — Progress Notes (Signed)
   TELEHEALTH VIRTUAL OBSTETRICS VISIT ENCOUNTER NOTE  I connected with Chelsea Wallace on 05/07/19 at 10:00 AM EDT by telephone at home and verified that I am speaking with the correct person using two identifiers.   I discussed the limitations, risks, security and privacy concerns of performing an evaluation and management service by telephone and the availability of in person appointments. I also discussed with the patient that there may be a patient responsible charge related to this service. The patient expressed understanding and agreed to proceed.  Subjective:  Chelsea Wallace is a 29 y.o. G4P3003 at 57w6dbeing followed for ongoing prenatal care.  She is currently monitored for the following issues for this low-risk pregnancy and has BMI 27.0-27.9,adult; Depo-Provera contraceptive status; and Encounter for supervision of normal first pregnancy in first trimester on their problem list.  Patient reports backache. Reports fetal movement. Denies any contractions, bleeding or leaking of fluid.   The following portions of the patient's history were reviewed and updated as appropriate: allergies, current medications, past family history, past medical history, past social history, past surgical history and problem list.   Objective:   General:  Alert, oriented and cooperative.   Mental Status: Normal mood and affect perceived. Normal judgment and thought content.  Rest of physical exam deferred due to type of encounter  Assessment and Plan:  Pregnancy: G4P3003 at 137w6d. Supervision of other normal pregnancy, antepartum Rx: - Blood Pressure KIT; 1 kit by Does not apply route once for 1 dose.  Dispense: 1 kit; Refill: 0   Preterm labor symptoms and general obstetric precautions including but not limited to vaginal bleeding, contractions, leaking of fluid and fetal movement were reviewed in detail with the patient.  I discussed the assessment and treatment plan with the patient. The  patient was provided an opportunity to ask questions and all were answered. The patient agreed with the plan and demonstrated an understanding of the instructions. The patient was advised to call back or seek an in-person office evaluation/[facility] any [facility] or concerning symptoms. Please refer to After Visit Summary for other counseling recommendations.   I provided 10 minutes of non-face-to-face time during this encounter.  Return in about 4 weeks (around 06/04/2019) for MyChart.  Future Appointments  Date Time Provider [place]4/23/2021 10:00 AM HaShelly BombardMD CWBarstowone  05/28/2019 10:30 AM WHWestvaleURSE WHTarrantFC-US  05/28/2019 10:30 AM WH-MFC [place] WH-MFCUS MFC-US    Arick Mareno, MDLexingtonor [facility] 05/07/2019

## 2019-05-09 LAB — CULTURE, OB URINE

## 2019-05-11 ENCOUNTER — Other Ambulatory Visit: Payer: Self-pay

## 2019-05-11 ENCOUNTER — Encounter (HOSPITAL_COMMUNITY): Payer: Self-pay | Admitting: Family Medicine

## 2019-05-11 ENCOUNTER — Inpatient Hospital Stay (HOSPITAL_COMMUNITY)
Admission: AD | Admit: 2019-05-11 | Discharge: 2019-05-12 | Disposition: A | Discharge status: Home / Self Care | Payer: Medicaid Other | Attending: Family Medicine | Admitting: Family Medicine

## 2019-05-11 DIAGNOSIS — R102 Pelvic and perineal pain: Secondary | ICD-10-CM | POA: Insufficient documentation

## 2019-05-11 DIAGNOSIS — Z3A16 16 weeks gestation of pregnancy: Secondary | ICD-10-CM | POA: Diagnosis not present

## 2019-05-11 DIAGNOSIS — O26892 Other specified pregnancy related conditions, second trimester: Secondary | ICD-10-CM | POA: Insufficient documentation

## 2019-05-11 DIAGNOSIS — O210 Mild hyperemesis gravidarum: Secondary | ICD-10-CM | POA: Insufficient documentation

## 2019-05-11 DIAGNOSIS — Z3401 Encounter for supervision of normal first pregnancy, first trimester: Secondary | ICD-10-CM

## 2019-05-11 DIAGNOSIS — R439 Unspecified disturbances of smell and taste: Secondary | ICD-10-CM | POA: Diagnosis not present

## 2019-05-11 DIAGNOSIS — R112 Nausea with vomiting, unspecified: Secondary | ICD-10-CM | POA: Diagnosis not present

## 2019-05-11 DIAGNOSIS — R82998 Other abnormal findings in urine: Secondary | ICD-10-CM | POA: Diagnosis not present

## 2019-05-11 LAB — URINALYSIS, ROUTINE W REFLEX MICROSCOPIC
Bilirubin Urine: NEGATIVE
Glucose, UA: NEGATIVE mg/dL
Hgb urine dipstick: NEGATIVE
Ketones, ur: 80 mg/dL — AB
Nitrite: NEGATIVE
Protein, ur: 30 mg/dL — AB
Specific Gravity, Urine: 1.027 (ref 1.005–1.030)
pH: 5 (ref 5.0–8.0)

## 2019-05-11 LAB — COMPREHENSIVE METABOLIC PANEL
ALT: 19 U/L (ref 0–44)
AST: 21 U/L (ref 15–41)
Albumin: 3.1 g/dL — ABNORMAL LOW (ref 3.5–5.0)
Alkaline Phosphatase: 56 U/L (ref 38–126)
Anion gap: 9 (ref 5–15)
BUN: 7 mg/dL (ref 6–20)
CO2: 21 mmol/L — ABNORMAL LOW (ref 22–32)
Calcium: 8.5 mg/dL — ABNORMAL LOW (ref 8.9–10.3)
Chloride: 105 mmol/L (ref 98–111)
Creatinine, Ser: 0.7 mg/dL (ref 0.44–1.00)
GFR calc Af Amer: >60 mL/min (ref >60)
GFR calc non Af Amer: >60 mL/min (ref >60)
Glucose, Bld: 94 mg/dL (ref 70–99)
Potassium: 3.8 mmol/L (ref 3.5–5.1)
Sodium: 135 mmol/L (ref 135–145)
Total Bilirubin: 0.5 mg/dL (ref 0.3–1.2)
Total Protein: 6.4 g/dL — ABNORMAL LOW (ref 6.5–8.1)

## 2019-05-11 LAB — WET PREP, GENITAL
Clue Cells Wet Prep HPF POC: NONE SEEN
Sperm: NONE SEEN
Trich, Wet Prep: NONE SEEN
Yeast Wet Prep HPF POC: NONE SEEN

## 2019-05-11 LAB — CBC WITH DIFFERENTIAL/PLATELET
Abs Immature Granulocytes: 0.02 10*3/uL (ref 0.00–0.07)
Basophils Absolute: 0 10*3/uL (ref 0.0–0.1)
Basophils Relative: 0 %
Eosinophils Absolute: 0 10*3/uL (ref 0.0–0.5)
Eosinophils Relative: 0 %
HCT: 36.2 % (ref 36.0–46.0)
Hemoglobin: 11.9 g/dL — ABNORMAL LOW (ref 12.0–15.0)
Immature Granulocytes: 0 %
Lymphocytes Relative: 13 %
Lymphs Abs: 0.6 10*3/uL — ABNORMAL LOW (ref 0.7–4.0)
MCH: 30.1 pg (ref 26.0–34.0)
MCHC: 32.9 g/dL (ref 30.0–36.0)
MCV: 91.4 fL (ref 80.0–100.0)
Monocytes Absolute: 0.5 10*3/uL (ref 0.1–1.0)
Monocytes Relative: 11 %
Neutro Abs: 3.6 10*3/uL (ref 1.7–7.7)
Neutrophils Relative %: 76 %
Platelets: 138 10*3/uL — ABNORMAL LOW (ref 150–400)
RBC: 3.96 MIL/uL (ref 3.87–5.11)
RDW: 12.7 % (ref 11.5–15.5)
WBC: 4.7 10*3/uL (ref 4.0–10.5)
nRBC: 0 % (ref 0.0–0.2)

## 2019-05-11 MED ORDER — PROMETHAZINE HCL 25 MG PO TABS
25.0000 mg | ORAL_TABLET | Freq: Once | ORAL | Status: AC
  Administered 2019-05-11: 25 mg via ORAL
  Filled 2019-05-11: qty 1

## 2019-05-11 MED ORDER — IBUPROFEN 600 MG PO TABS
600.0000 mg | ORAL_TABLET | Freq: Once | ORAL | Status: AC
  Administered 2019-05-11: 600 mg via ORAL
  Filled 2019-05-11: qty 1

## 2019-05-11 MED ORDER — ONDANSETRON 4 MG PO TBDP
4.0000 mg | ORAL_TABLET | Freq: Four times a day (QID) | ORAL | 0 refills | Status: DC | PRN
Start: 2019-05-11 — End: 2019-05-14

## 2019-05-11 MED ORDER — CEPHALEXIN 500 MG PO CAPS
500.0000 mg | ORAL_CAPSULE | Freq: Four times a day (QID) | ORAL | 0 refills | Status: DC
Start: 2019-05-11 — End: 2019-10-25

## 2019-05-11 MED ORDER — PHENAZOPYRIDINE HCL 100 MG PO TABS
100.0000 mg | ORAL_TABLET | Freq: Once | ORAL | Status: AC
  Administered 2019-05-11: 23:00:00 100 mg via ORAL
  Filled 2019-05-11: qty 1

## 2019-05-11 MED ORDER — PROMETHAZINE HCL 25 MG PO TABS
25.0000 mg | ORAL_TABLET | Freq: Four times a day (QID) | ORAL | 2 refills | Status: DC | PRN
Start: 2019-05-11 — End: 2019-05-14

## 2019-05-11 NOTE — MAU Note (Signed)
PT  SAYS SHE WAS Friday - WITH CRAMPING  AND H/A-  THEN  BOTH ARE WORSE TODAY - THEN DRANK  AND VOMITED .  IN B-ROOM - WIPED -  RED BLOOD ON TP. NONE ON UNDERWEAR.  WAS CRYING  FROM PAIN OF CRAMPING - SO SHE COULDN'T TASTE THE FRIES SHE WAS EATING .  HER CHILDREN WERE AROUND THEIR DAD ON 4-7/8( HE HAD COVID 3 WEEKS AGO ) . ALL TESTED ON 4-19-  EVERYBODY NEG.

## 2019-05-11 NOTE — MAU Provider Note (Signed)
Chief Complaint:  No chief complaint on file.   First Provider Initiated Contact with Patient 05/11/19 2057     HPI: Chelsea Wallace is a 29 y.o. G4P3003 at 38w3dwho presents to maternity admissions reporting pelvic cramping and bleeding today.  Only saw blood on tissue.  No further bleeding. .Also has new nausea and vomiting today.  She denies LOF, vaginal itching/burning, urinary symptoms, h/a, dizziness, diarrhea, constipation or fever/chills.    Vaginal Bleeding The patient's primary symptoms include pelvic pain and vaginal bleeding. The patient's pertinent negatives include no genital itching, genital lesions or genital odor. This is a new problem. The current episode started today. The problem occurs intermittently. The problem has been resolved. The problem affects both sides. She is pregnant. Associated symptoms include abdominal pain, nausea and vomiting. Pertinent negatives include no back pain, chills, constipation, diarrhea, dysuria or fever. The vaginal discharge was bloody. The vaginal bleeding is lighter than menses. She has not been passing clots. She has not been passing tissue. Nothing aggravates the symptoms. She has tried nothing for the symptoms.  Emesis  This is a new problem. The current episode started today. There has been no fever. Associated symptoms include abdominal pain. Pertinent negatives include no chills, diarrhea or fever. She has tried nothing for the symptoms.   RN Note: PT  SAYS SHE WAS Friday - WITH CRAMPING  AND H/A-  THEN  BOTH ARE WORSE TODAY - THEN DRANK  AND VOMITED .  IN B-ROOM - WIPED -  RED BLOOD ON TP. NONE ON UNDERWEAR.  WAS CRYING  FROM PAIN OF CRAMPING - SO SHE COULDN'T TASTE THE FRIES SHE WAS EATING .  HER CHILDREN WERE AROUND THEIR DAD ON 4-7/8( HE HAD COVID 3 WEEKS AGO ) . ALL TESTED ON 4-19-  EVERYBODY NEG.    Past Medical History: Past Medical History:  Diagnosis Date  . GERD (gastroesophageal reflux disease)    on prescription for it; pt  cannot remember name of med.    Past obstetric history: OB History  Gravida Para Term Preterm AB Living  4 3 3  0 0 3  SAB TAB Ectopic Multiple Live Births  0 0 0 0 3    # Outcome Date GA Lbr Len/2nd Weight Sex Delivery Anes PTL Lv  4 Current           3 Term 12/11/16 [redacted]w[redacted]d 03:53 / 00:03 3675 g F Vag-Spont None  LIV  2 Term 01/31/15 [redacted]w[redacted]d 12:33 / 00:17 3860 g M Vag-Spont EPI  LIV     Birth Comments: WNL  1 Term 05/23/13 [redacted]w[redacted]d 31:30 / 01:29 3445 g M Vag-Spont EPI  LIV    Past Surgical History: Past Surgical History:  Procedure Laterality Date  . MANDIBLE RECONSTRUCTION     lower jaw surgery 2011  . MANDIBLE SURGERY Bilateral    age 29 or 27, manibles fx to adjust bite    Family History: Family History  Problem Relation Age of Onset  . Diabetes Mother   . Diabetes Maternal Grandfather   . Hypertension Paternal Grandmother   . Hypertension Paternal Grandfather     Social History: Social History   Tobacco Use  . Smoking status: Never Smoker  . Smokeless tobacco: Never Used  Substance Use Topics  . Alcohol use: No  . Drug use: No    Allergies: No Known Allergies  Meds:  Medications Prior to Admission  Medication Sig Dispense Refill Last Dose  . cyclobenzaprine (FLEXERIL) 5 MG tablet Take 1  tablet (5 mg total) by mouth 3 (three) times daily as needed for muscle spasms. 20 tablet 0 Past Week at Unknown time  . Prenat w/o A-FeCbn-Meth-FA-DHA (PRENATE MINI) 29-0.6-0.4-350 MG CAPS Take 1 capsule by mouth daily before breakfast. 90 capsule 4 05/11/2019 at Unknown time  . Elastic Bandages & Supports (COMFORT FIT MATERNITY SUPP MED) MISC 1 Device by Does not apply route daily. 1 each 0     I have reviewed patient's Past Medical Hx, Surgical Hx, Family Hx, Social Hx, medications and allergies.   ROS:  Review of Systems  Constitutional: Negative for chills and fever.  Gastrointestinal: Positive for abdominal pain, nausea and vomiting. Negative for constipation and  diarrhea.  Genitourinary: Positive for pelvic pain and vaginal bleeding. Negative for dysuria.  Musculoskeletal: Negative for back pain.   Other systems negative  Physical Exam   Patient Vitals for the past 24 hrs:  BP Temp Temp src Pulse Resp Height Weight  05/11/19 1939 102/66 99.2 F (37.3 C) Oral (!) 117 20 5\' 5"  (1.651 m) 80.7 kg   Constitutional: Well-developed, well-nourished female in no acute distress.  Cardiovascular: normal rate and rhythm Respiratory: normal effort, clear to auscultation bilaterally GI: Abd soft, non-tender, gravid appropriate for gestational age.   No rebound or guarding. MS: Extremities nontender, no edema, normal ROM Neurologic: Alert and oriented x 4.  GU: Neg CVAT.  PELVIC EXAM: Cervix pink, visually closed, ectropion seen but no blood at present, scant white creamy discharge, vaginal walls and external genitalia normal  FHT:  163   Labs: Results for orders placed or performed during the hospital encounter of 05/11/19 (from the past 24 hour(s))  Urinalysis, Routine w reflex microscopic     Status: Abnormal   Collection Time: 05/11/19  8:05 PM  Result Value Ref Range   Color, Urine YELLOW YELLOW   APPearance HAZY (A) CLEAR   Specific Gravity, Urine 1.027 1.005 - 1.030   pH 5.0 5.0 - 8.0   Glucose, UA NEGATIVE NEGATIVE mg/dL   Hgb urine dipstick NEGATIVE NEGATIVE   Bilirubin Urine NEGATIVE NEGATIVE   Ketones, ur 80 (A) NEGATIVE mg/dL   Protein, ur 30 (A) NEGATIVE mg/dL   Nitrite NEGATIVE NEGATIVE   Leukocytes,Ua LARGE (A) NEGATIVE   RBC / HPF 0-5 0 - 5 RBC/hpf   WBC, UA 11-20 0 - 5 WBC/hpf   Bacteria, UA RARE (A) NONE SEEN   Squamous Epithelial / LPF 11-20 0 - 5   Mucus PRESENT   Wet prep, genital     Status: Abnormal   Collection Time: 05/11/19  9:28 PM   Specimen: Vaginal  Result Value Ref Range   Yeast Wet Prep HPF POC NONE SEEN NONE SEEN   Trich, Wet Prep NONE SEEN NONE SEEN   Clue Cells Wet Prep HPF POC NONE SEEN NONE SEEN    WBC, Wet Prep HPF POC MANY (A) NONE SEEN   Sperm NONE SEEN   CBC with Differential/Platelet     Status: Abnormal   Collection Time: 05/11/19  9:51 PM  Result Value Ref Range   WBC 4.7 4.0 - 10.5 K/uL   RBC 3.96 3.87 - 5.11 MIL/uL   Hemoglobin 11.9 (L) 12.0 - 15.0 g/dL   HCT 05/13/19 99.2 - 42.6 %   MCV 91.4 80.0 - 100.0 fL   MCH 30.1 26.0 - 34.0 pg   MCHC 32.9 30.0 - 36.0 g/dL   RDW 83.4 19.6 - 22.2 %   Platelets 138 (L) 150 - 400  K/uL   nRBC 0.0 0.0 - 0.2 %   Neutrophils Relative % 76 %   Neutro Abs 3.6 1.7 - 7.7 K/uL   Lymphocytes Relative 13 %   Lymphs Abs 0.6 (L) 0.7 - 4.0 K/uL   Monocytes Relative 11 %   Monocytes Absolute 0.5 0.1 - 1.0 K/uL   Eosinophils Relative 0 %   Eosinophils Absolute 0.0 0.0 - 0.5 K/uL   Basophils Relative 0 %   Basophils Absolute 0.0 0.0 - 0.1 K/uL   Immature Granulocytes 0 %   Abs Immature Granulocytes 0.02 0.00 - 0.07 K/uL  Comprehensive metabolic panel     Status: Abnormal   Collection Time: 05/11/19  9:51 PM  Result Value Ref Range   Sodium 135 135 - 145 mmol/L   Potassium 3.8 3.5 - 5.1 mmol/L   Chloride 105 98 - 111 mmol/L   CO2 21 (L) 22 - 32 mmol/L   Glucose, Bld 94 70 - 99 mg/dL   BUN 7 6 - 20 mg/dL   Creatinine, Ser 0.70 0.44 - 1.00 mg/dL   Calcium 8.5 (L) 8.9 - 10.3 mg/dL   Total Protein 6.4 (L) 6.5 - 8.1 g/dL   Albumin 3.1 (L) 3.5 - 5.0 g/dL   AST 21 15 - 41 U/L   ALT 19 0 - 44 U/L   Alkaline Phosphatase 56 38 - 126 U/L   Total Bilirubin 0.5 0.3 - 1.2 mg/dL   GFR calc non Af Amer >60 >60 mL/min   GFR calc Af Amer >60 >60 mL/min   Anion gap 9 5 - 15   Urine sent to culture O/Positive/-- (03/26 1110)  Imaging:  No results found.  MAU Course/MDM: I have ordered labs and reviewed results. UA is suspicious for UTI. Sent to culture Gave ibuprofen with no relief in pain Gave one dose of Pyridium for posslble bladder pain as source of pain >> States no relief DIscussed no leukocytosis or abnormalities in labs, doubt  appendicitis UA suggestive of UTI, so will treat presumptively and culture urine Will Rx phenergan and zofran for nausea so she can keep meds down  Assessment: Single IUP at [redacted]w[redacted]d Pelvic pain in pregnancy Leukocytes in urine, likely UTI Nausea and vomiting Loss of taste, ?Covid  Plan: Discharge home Rx Keflex for presumed UTI Rx Zofran and Phenergan for nausea and vomiting Push PO fluids Urine to culture PTL precautions Follow up in Office for prenatal visits Encouraged to return here or to other Urgent Care/ED if she develops worsening of symptoms, increase in pain, fever, or other concerning symptoms.   Pt stable at time of discharge.  Hansel Feinstein CNM, MSN Certified Nurse-Midwife 05/11/2019 8:58 PM

## 2019-05-11 NOTE — Discharge Instructions (Signed)
Nausea and Vomiting, Adult Nausea is the feeling that you have an upset stomach or that you are about to vomit. Vomiting is when stomach contents are thrown up and out of the mouth as a result of nausea. Vomiting can make you feel weak and cause you to become dehydrated. Dehydration can make you feel tired and thirsty, cause you to have a dry mouth, and decrease how often you urinate. Older adults and people with other diseases or a weak disease-fighting system (immune system) are at higher risk for dehydration. It is important to treat your nausea and vomiting as told by your health care provider. Follow these instructions at home: Watch your symptoms for any changes. Tell your health care provider about them. Follow these instructions to care for yourself at home. Eating and drinking      Take an oral rehydration solution (ORS). This is a drink that is sold at pharmacies and retail stores.  Drink clear fluids slowly and in small amounts as you are able. Clear fluids include water, ice chips, low-calorie sports drinks, and fruit juice that has water added (diluted fruit juice).  Eat bland, easy-to-digest foods in small amounts as you are able. These foods include bananas, applesauce, rice, lean meats, toast, and crackers.  Avoid fluids that contain a lot of sugar or caffeine, such as energy drinks, sports drinks, and soda.  Avoid alcohol.  Avoid spicy or fatty foods. General instructions  Take over-the-counter and prescription medicines only as told by your health care provider.  Drink enough fluid to keep your urine pale yellow.  Wash your hands often using soap and water. If soap and water are not available, use hand sanitizer.  Make sure that all people in your household wash their hands well and often.  Rest at home while you recover.  Watch your condition for any changes.  Breathe slowly and deeply when you feel nauseated.  Keep all follow-up visits as told by your health  care provider. This is important. Contact a health care provider if:  Your symptoms get worse.  You have new symptoms.  You have a fever.  You cannot drink fluids without vomiting.  Your nausea does not go away after 2 days.  You feel light-headed or dizzy.  You have a headache.  You have muscle cramps.  You have a rash.  You have pain while urinating. Get help right away if:  You have pain in your chest, neck, arm, or jaw.  You feel extremely weak or you faint.  You have persistent vomiting.  You have vomit that is bright red or looks like black coffee grounds.  You have bloody or black stools or stools that look like tar.  You have a severe headache, a stiff neck, or both.  You have severe pain, cramping, or bloating in your abdomen.  You have difficulty breathing, or you are breathing very quickly.  Your heart is beating very quickly.  Your skin feels cold and clammy.  You feel confused.  You have signs of dehydration, such as: ? Dark urine, very little urine, or no urine. ? Cracked lips. ? Dry mouth. ? Sunken eyes. ? Sleepiness. ? Weakness. These symptoms may represent a serious problem that is an emergency. Do not wait to see if the symptoms will go away. Get medical help right away. Call your local emergency services (911 in the [place] Do not drive yourself to the hospital. Summary  Nausea is the feeling that you have an upset stomach  or that you are about to vomit. As nausea gets worse, it can lead to vomiting. Vomiting can make you feel weak and cause you to become dehydrated.  Follow instructions from your health care provider about eating and drinking to prevent dehydration.  Take over-the-counter and prescription medicines only as told by your health care provider.  Contact your health care provider if your symptoms get worse, or you have new symptoms.  Keep all follow-up visits as told by your health care provider. This is important. This  information is not intended to replace advice given to you by your health care provider. Make sure you discuss any questions you have with your health care provider. Document Revised: 04/24/2018 Document Reviewed: 06/10/2017 Elsevier Patient Education  [place] of Pregnancy The second trimester is from week 14 through week 27 (months 4 through 6). The second trimester is often a time when you feel your best. Your body has adjusted to being pregnant, and you begin to feel better physically. Usually, morning sickness has lessened or quit completely, you may have more energy, and you may have an increase in appetite. The second trimester is also a time when the fetus is growing rapidly. At the end of the sixth month, the fetus is about 9 inches long and weighs about 1 pounds. You will likely begin to feel the baby move (quickening) between 16 and 20 weeks of pregnancy. Body changes during your second trimester Your body continues to go through many changes during your second trimester. The changes vary from woman to woman.  Your weight will continue to increase. You will notice your lower abdomen bulging out.  You may begin to get stretch marks on your hips, abdomen, and breasts.  You may develop headaches that can be relieved by medicines. The medicines should be approved by your health care provider.  You may urinate more often because the fetus is pressing on your bladder.  You may develop or continue to have heartburn as a result of your pregnancy.  You may develop constipation because certain hormones are causing the muscles that push waste through your intestines to slow down.  You may develop hemorrhoids or swollen, bulging veins (varicose veins).  You may have back pain. This is caused by: ? Weight gain. ? Pregnancy hormones that are relaxing the joints in your pelvis. ? A shift in weight and the muscles that support your balance.  Your breasts will  continue to grow and they will continue to become tender.  Your gums may bleed and may be sensitive to brushing and flossing.  Dark spots or blotches (chloasma, mask of pregnancy) may develop on your face. This will likely fade after the baby is born.  A dark line from your belly button to the pubic area (linea nigra) may appear. This will likely fade after the baby is born.  You may have changes in your hair. These can include thickening of your hair, rapid growth, and changes in texture. Some women also have hair loss during or after pregnancy, or hair that feels dry or thin. Your hair will most likely return to normal after your baby is born. What to expect at prenatal visits During a routine prenatal visit:  You will be weighed to make sure you and the fetus are growing normally.  Your blood pressure will be taken.  Your abdomen will be measured to track your baby's growth.  The fetal heartbeat will be listened to.  Any test results  from the previous visit will be discussed. Your health care provider may ask you:  How you are feeling.  If you are feeling the baby move.  If you have had any abnormal symptoms, such as leaking fluid, bleeding, severe headaches, or abdominal cramping.  If you are using any tobacco products, including cigarettes, chewing tobacco, and electronic cigarettes.  If you have any questions. Other tests that may be performed during your second trimester include:  Blood tests that check for: ? Low iron levels (anemia). ? High blood sugar that affects pregnant women (gestational diabetes) between 60 and 28 weeks. ? Rh antibodies. This is to check for a protein on red blood cells (Rh factor).  Urine tests to check for infections, diabetes, or protein in the urine.  An ultrasound to confirm the proper growth and development of the baby.  An amniocentesis to check for possible genetic problems.  Fetal screens for spina bifida and Down syndrome.  HIV  (human immunodeficiency virus) testing. Routine prenatal testing includes screening for HIV, unless you choose not to have this test. Follow these instructions at home: Medicines  Follow your health care provider's instructions regarding medicine use. Specific medicines may be either safe or unsafe to take during pregnancy.  Take a prenatal vitamin that contains at least 600 micrograms (mcg) of folic acid.  If you develop constipation, try taking a stool softener if your health care provider approves. Eating and drinking   Eat a balanced diet that includes fresh fruits and vegetables, whole grains, good sources of protein such as meat, eggs, or tofu, and low-fat dairy. Your health care provider will help you determine the amount of weight gain that is right for you.  Avoid raw meat and uncooked cheese. These carry germs that can cause birth defects in the baby.  If you have low calcium intake from food, talk to your health care provider about whether you should take a daily calcium supplement.  Limit foods that are high in fat and processed sugars, such as fried and sweet foods.  To prevent constipation: ? Drink enough fluid to keep your urine clear or pale yellow. ? Eat foods that are high in fiber, such as fresh fruits and vegetables, whole grains, and beans. Activity  Exercise only as directed by your health care provider. Most women can continue their usual exercise routine during pregnancy. Try to exercise for 30 minutes at least 5 days a week. Stop exercising if you experience uterine contractions.  Avoid heavy lifting, wear low heel shoes, and practice good posture.  A sexual relationship may be continued unless your health care provider directs you otherwise. Relieving pain and discomfort  Wear a good support bra to prevent discomfort from breast tenderness.  Take warm sitz baths to soothe any pain or discomfort caused by hemorrhoids. Use hemorrhoid cream if your health care  provider approves.  Rest with your legs elevated if you have leg cramps or low back pain.  If you develop varicose veins, wear support hose. Elevate your feet for 15 minutes, 3-4 times a day. Limit salt in your diet. Prenatal Care  Write down your questions. Take them to your prenatal visits.  Keep all your prenatal visits as told by your health care provider. This is important. Safety  Wear your seat belt at all times when driving.  Make a list of emergency phone numbers, including numbers for family, friends, the hospital, and police and fire departments. General instructions  Ask your health care provider for  a referral to a local prenatal education class. Begin classes no later than the beginning of month 6 of your pregnancy.  Ask for help if you have counseling or nutritional needs during pregnancy. Your health care provider can offer advice or refer you to specialists for help with various needs.  Do not use hot tubs, steam rooms, or saunas.  Do not douche or use tampons or scented sanitary pads.  Do not cross your legs for long periods of time.  Avoid cat litter boxes and soil used by cats. These carry germs that can cause birth defects in the baby and possibly loss of the fetus by miscarriage or stillbirth.  Avoid all smoking, herbs, alcohol, and unprescribed drugs. Chemicals in these products can affect the formation and growth of the baby.  Do not use any products that contain nicotine or tobacco, such as cigarettes and e-cigarettes. If you need help quitting, ask your health care provider.  Visit your dentist if you have not gone yet during your pregnancy. Use a soft toothbrush to brush your teeth and be gentle when you floss. Contact a health care provider if:  You have dizziness.  You have mild pelvic cramps, pelvic pressure, or nagging pain in the abdominal area.  You have persistent nausea, vomiting, or diarrhea.  You have a bad smelling vaginal  discharge.  You have pain when you urinate. Get help right away if:  You have a fever.  You are leaking fluid from your vagina.  You have spotting or bleeding from your vagina.  You have severe abdominal cramping or pain.  You have rapid weight gain or weight loss.  You have shortness of breath with chest pain.  You notice sudden or extreme swelling of your face, hands, ankles, feet, or legs.  You have not felt your baby move in over an hour.  You have severe headaches that do not go away when you take medicine.  You have vision changes. Summary  The second trimester is from week 14 through week 27 (months 4 through 6). It is also a time when the fetus is growing rapidly.  Your body goes through many changes during pregnancy. The changes vary from woman to woman.  Avoid all smoking, herbs, alcohol, and unprescribed drugs. These chemicals affect the formation and growth your baby.  Do not use any tobacco products, such as cigarettes, chewing tobacco, and e-cigarettes. If you need help quitting, ask your health care provider.  Contact your health care provider if you have any questions. Keep all prenatal visits as told by your health care provider. This is important. This information is not intended to replace advice given to you by your health care provider. Make sure you discuss any questions you have with your health care provider. Document Revised: 04/24/2018 Document Reviewed: 02/06/2016 Elsevier Patient Education  2020 [employer] Pregnancy and Urinary Tract Infection  A urinary tract infection (UTI) is an infection of any part of the urinary tract. This includes the kidneys, the tubes that connect your kidneys to your bladder (ureters), the bladder, and the tube that carries urine out of your body (urethra). These organs make, store, and get rid of urine in the body. Your health care provider may use other names to describe the infection. An upper UTI affects the  ureters and kidneys (pyelonephritis). A lower UTI affects the bladder (cystitis) and urethra (urethritis). Most urinary tract infections are caused by bacteria in your genital area, around the entrance to your urinary tract (  urethra). These bacteria grow and cause irritation and inflammation of your urinary tract. You are more likely to develop a UTI during pregnancy because the physical and hormonal changes your body goes through can make it easier for bacteria to get into your urinary tract. Your growing baby also puts pressure on your bladder and can affect urine flow. It is important to recognize and treat UTIs in pregnancy because of the risk of serious complications for both you and your baby. How does this affect me? Symptoms of a UTI include:  Needing to urinate right away (urgently).  Frequent urination or passing small amounts of urine frequently.  Pain or burning with urination.  Blood in the urine.  Urine that smells bad or unusual.  Trouble urinating.  Cloudy urine.  Pain in the abdomen or lower back.  Vaginal discharge. You may also have:  Vomiting or a decreased appetite.  Confusion.  Irritability or tiredness.  A fever.  Diarrhea. How does this affect my baby? An untreated UTI during pregnancy could lead to a kidney infection or a systemic infection, which can cause health problems that could affect your baby. Possible complications of an untreated UTI include:  Giving birth to your baby before 37 weeks of pregnancy (premature).  Having a baby with a low birth weight.  Developing high blood pressure during pregnancy (preeclampsia).  Having a low hemoglobin level (anemia). What can I do to lower my risk? To prevent a UTI:  Go to the bathroom as soon as you feel the need. Do not hold urine for long periods of time.  Always wipe from front to back, especially after a bowel movement. Use each tissue one time when you wipe.  Empty your bladder after  sex.  Keep your genital area dry.  Drink 6-10 glasses of water each day.  Do not douche or use deodorant sprays. How is this treated? Treatment for this condition may include:  Antibiotic medicines that are safe to take during pregnancy.  Other medicines to treat less common causes of UTI. Follow these instructions at home:  If you were prescribed an antibiotic medicine, take it as told by your health care provider. Do not stop using the antibiotic even if you start to feel better.  Keep all follow-up visits as told by your health care provider. This is important. Contact a health care provider if:  Your symptoms do not improve or they get worse.  You have abnormal vaginal discharge. Get help right away if you:  Have a fever.  Have nausea and vomiting.  Have back or side pain.  Feel contractions in your uterus.  Have lower belly pain.  Have a gush of fluid from your vagina.  Have blood in your urine. Summary  A urinary tract infection (UTI) is an infection of any part of the urinary tract, which includes the kidneys, ureters, bladder, and urethra.  Most urinary tract infections are caused by bacteria in your genital area, around the entrance to your urinary tract (urethra).  You are more likely to develop a UTI during pregnancy.  If you were prescribed an antibiotic medicine, take it as told by your health care provider. Do not stop using the antibiotic even if you start to feel better. This information is not intended to replace advice given to you by your health care provider. Make sure you discuss any questions you have with your health care provider. Document Revised: 04/24/2018 Document Reviewed: 12/04/2017 Elsevier Patient Education  2020 [employer].

## 2019-05-12 LAB — CULTURE, OB URINE

## 2019-05-12 LAB — GC/CHLAMYDIA PROBE AMP (~~LOC~~) NOT AT ARMC
Chlamydia: NEGATIVE
Comment: NEGATIVE
Comment: NORMAL
Neisseria Gonorrhea: NEGATIVE

## 2019-05-14 ENCOUNTER — Inpatient Hospital Stay (HOSPITAL_COMMUNITY)
Admission: AD | Admit: 2019-05-14 | Discharge: 2019-05-14 | Disposition: A | Discharge status: Home / Self Care | Payer: Medicaid Other | Attending: Obstetrics and Gynecology | Admitting: Obstetrics and Gynecology

## 2019-05-14 ENCOUNTER — Encounter (HOSPITAL_COMMUNITY): Payer: Self-pay | Admitting: Obstetrics and Gynecology

## 2019-05-14 DIAGNOSIS — O98512 Other viral diseases complicating pregnancy, second trimester: Secondary | ICD-10-CM

## 2019-05-14 DIAGNOSIS — R05 Cough: Secondary | ICD-10-CM | POA: Insufficient documentation

## 2019-05-14 DIAGNOSIS — R6883 Chills (without fever): Secondary | ICD-10-CM | POA: Diagnosis not present

## 2019-05-14 DIAGNOSIS — Z3A16 16 weeks gestation of pregnancy: Secondary | ICD-10-CM | POA: Insufficient documentation

## 2019-05-14 DIAGNOSIS — R1030 Lower abdominal pain, unspecified: Secondary | ICD-10-CM | POA: Insufficient documentation

## 2019-05-14 DIAGNOSIS — O26892 Other specified pregnancy related conditions, second trimester: Secondary | ICD-10-CM | POA: Insufficient documentation

## 2019-05-14 DIAGNOSIS — R519 Headache, unspecified: Secondary | ICD-10-CM | POA: Insufficient documentation

## 2019-05-14 DIAGNOSIS — M545 Low back pain: Secondary | ICD-10-CM | POA: Diagnosis not present

## 2019-05-14 DIAGNOSIS — Z3401 Encounter for supervision of normal first pregnancy, first trimester: Secondary | ICD-10-CM

## 2019-05-14 DIAGNOSIS — R112 Nausea with vomiting, unspecified: Secondary | ICD-10-CM | POA: Diagnosis not present

## 2019-05-14 DIAGNOSIS — Z79899 Other long term (current) drug therapy: Secondary | ICD-10-CM | POA: Insufficient documentation

## 2019-05-14 DIAGNOSIS — U071 COVID-19: Secondary | ICD-10-CM

## 2019-05-14 LAB — URINALYSIS, ROUTINE W REFLEX MICROSCOPIC
Bilirubin Urine: NEGATIVE
Glucose, UA: NEGATIVE mg/dL
Hgb urine dipstick: NEGATIVE
Ketones, ur: 80 mg/dL — AB
Nitrite: NEGATIVE
Protein, ur: 100 mg/dL — AB
Specific Gravity, Urine: 1.023 (ref 1.005–1.030)
pH: 6 (ref 5.0–8.0)

## 2019-05-14 LAB — COMPREHENSIVE METABOLIC PANEL
ALT: 18 U/L (ref 0–44)
AST: 20 U/L (ref 15–41)
Albumin: 3.1 g/dL — ABNORMAL LOW (ref 3.5–5.0)
Alkaline Phosphatase: 53 U/L (ref 38–126)
Anion gap: 9 (ref 5–15)
BUN: 9 mg/dL (ref 6–20)
CO2: 24 mmol/L (ref 22–32)
Calcium: 8.7 mg/dL — ABNORMAL LOW (ref 8.9–10.3)
Chloride: 102 mmol/L (ref 98–111)
Creatinine, Ser: 0.65 mg/dL (ref 0.44–1.00)
GFR calc Af Amer: >60 mL/min (ref >60)
GFR calc non Af Amer: >60 mL/min (ref >60)
Glucose, Bld: 106 mg/dL — ABNORMAL HIGH (ref 70–99)
Potassium: 3.8 mmol/L (ref 3.5–5.1)
Sodium: 135 mmol/L (ref 135–145)
Total Bilirubin: 0.7 mg/dL (ref 0.3–1.2)
Total Protein: 6.5 g/dL (ref 6.5–8.1)

## 2019-05-14 LAB — CBC
HCT: 36 % (ref 36.0–46.0)
Hemoglobin: 12.1 g/dL (ref 12.0–15.0)
MCH: 30.1 pg (ref 26.0–34.0)
MCHC: 33.6 g/dL (ref 30.0–36.0)
MCV: 89.6 fL (ref 80.0–100.0)
Platelets: 158 10*3/uL (ref 150–400)
RBC: 4.02 MIL/uL (ref 3.87–5.11)
RDW: 12.4 % (ref 11.5–15.5)
WBC: 4.7 10*3/uL (ref 4.0–10.5)
nRBC: 0 % (ref 0.0–0.2)

## 2019-05-14 LAB — MAGNESIUM: Magnesium: 2 mg/dL (ref 1.7–2.4)

## 2019-05-14 MED ORDER — ONDANSETRON 4 MG PO TBDP
4.0000 mg | ORAL_TABLET | Freq: Four times a day (QID) | ORAL | 0 refills | Status: DC | PRN
Start: 2019-05-14 — End: 2019-10-26

## 2019-05-14 MED ORDER — SODIUM CHLORIDE 0.9 % IV BOLUS
2000.0000 mL | Freq: Once | INTRAVENOUS | Status: AC
  Administered 2019-05-14: 2000 mL via INTRAVENOUS

## 2019-05-14 MED ORDER — ACETAMINOPHEN 500 MG PO TABS
1000.0000 mg | ORAL_TABLET | Freq: Once | ORAL | Status: AC
  Administered 2019-05-14: 21:00:00 1000 mg via ORAL
  Filled 2019-05-14: qty 2

## 2019-05-14 MED ORDER — METOCLOPRAMIDE HCL 5 MG/ML IJ SOLN
10.0000 mg | Freq: Once | INTRAMUSCULAR | Status: AC
  Administered 2019-05-14: 10 mg via INTRAVENOUS
  Filled 2019-05-14: qty 2

## 2019-05-14 MED ORDER — METOCLOPRAMIDE HCL 5 MG PO TABS
5.0000 mg | ORAL_TABLET | Freq: Three times a day (TID) | ORAL | 0 refills | Status: DC | PRN
Start: 2019-05-14 — End: 2019-10-26

## 2019-05-14 NOTE — MAU Provider Note (Signed)
Chief Complaint: Abdominal Cramping   First Provider Initiated Contact with Patient 05/14/19 1922      SUBJECTIVE HPI: Chelsea Wallace is a 29 y.o. G4P3003 at [redacted]w[redacted]d who presents to maternity admissions reporting lower abdominal cramping and lower back pain. She was diagnosed Wednesday with COVID with symptoms starting Tuesday. She noticed loss of taste on 4/27. Reports COVID exposure as her children's dad was recently diagnosed. She has had nausea, vomiting as well since Tuesday with difficulty tolerating PO. Cough started on Wednesday. She has a headache since the end of last week. At home, she tried Flexeril, Zofran and Tylenol without relief. Abdominal cramping has been constant for the last few hours but previously intermittent. Reports having chills and was unsure if she had a fever. Denies dizziness, SOB, chest pain, diarrhea, constipation, dysuria, hematuria, hematochezia, melena, difficulty moving arms/legs, speech difficulty, confusion or any other complaints. She denies vaginal bleeding, vaginal itching/burning, urinary symptoms, dizziness.  Past Medical History:  Diagnosis Date  . GERD (gastroesophageal reflux disease)    on prescription for it; pt cannot remember name of med.   Past Surgical History:  Procedure Laterality Date  . MANDIBLE RECONSTRUCTION     lower jaw surgery 2011  . MANDIBLE SURGERY Bilateral    age 40 or 15, manibles fx to adjust bite   Social History   Socioeconomic History  . Marital status: Single    Spouse name: Not on file  . Number of children: Not on file  . Years of education: Not on file  . Highest education level: Not on file  Occupational History  . Not on file  Tobacco Use  . Smoking status: Never Smoker  . Smokeless tobacco: Never Used  Substance and Sexual Activity  . Alcohol use: No  . Drug use: No  . Sexual activity: Yes    Birth control/protection: None  Other Topics Concern  . Not on file  Social History Narrative   ** Merged  History Encounter **       Social Determinants of Health   Financial Resource Strain:   . Difficulty of Paying Living Expenses:   Food Insecurity:   . Worried About Programme researcher, broadcasting/film/video in the Last Year:   . Barista in the Last Year:   Transportation Needs:   . Freight forwarder (Medical):   Marland Kitchen Lack of Transportation (Non-Medical):   Physical Activity:   . Days of Exercise per Week:   . Minutes of Exercise per Session:   Stress:   . Feeling of Stress :   Social Connections:   . Frequency of Communication with Friends and Family:   . Frequency of Social Gatherings with Friends and Family:   . Attends Religious Services:   . Active Member of Clubs or Organizations:   . Attends Banker Meetings:   Marland Kitchen Marital Status:   Intimate Partner Violence:   . Fear of Current or Ex-Partner:   . Emotionally Abused:   Marland Kitchen Physically Abused:   . Sexually Abused:    No current facility-administered medications on file prior to encounter.   Current Outpatient Medications on File Prior to Encounter  Medication Sig Dispense Refill  . cephALEXin (KEFLEX) 500 MG capsule Take 1 capsule (500 mg total) by mouth 4 (four) times daily. 28 capsule 0  . cyclobenzaprine (FLEXERIL) 5 MG tablet Take 1 tablet (5 mg total) by mouth 3 (three) times daily as needed for muscle spasms. 20 tablet 0  . Prenat  w/o A-FeCbn-Meth-FA-DHA (PRENATE MINI) 29-0.6-0.4-350 MG CAPS Take 1 capsule by mouth daily before breakfast. 90 capsule 4  . promethazine (PHENERGAN) 25 MG tablet Take 1 tablet (25 mg total) by mouth every 6 (six) hours as needed for nausea or vomiting. 30 tablet 2  . Elastic Bandages & Supports (COMFORT FIT MATERNITY SUPP MED) MISC 1 Device by Does not apply route daily. 1 each 0   No Known Allergies  ROS:  Review of Systems All other systems negative unless noted above in HPI.   I have reviewed patient's Past Medical Hx, Surgical Hx, Family Hx, Social Hx, medications and allergies.    Physical Exam   Patient Vitals for the past 24 hrs:  BP Temp Temp src Pulse Resp SpO2 Height Weight  05/14/19 2126 -- 98.4 F (36.9 C) -- (!) 102 -- 99 % -- --  05/14/19 2034 125/68 -- -- (!) 114 -- -- -- --  05/14/19 1920 -- -- -- -- -- 98 % -- --  05/14/19 1913 122/72 (!) 102.9 F (39.4 C) Oral (!) 115 20 100 % 5\' 5"  (1.651 m) 80.7 kg   Constitutional: Well-developed, well-nourished female in no acute distress.  Cardiovascular: tachycardic with regular rhythm Respiratory: normal effort, CTAB GI: Abd soft, mild generalized non-localizing tenderness to palpation MS: Extremities nontender, no edema, normal ROM Neurologic: Alert and oriented x 4.  GU: Neg CVAT. Bimanual exam: Cervix 0/long/high, firm, anterior, neg CMT, uterus nontender, nonenlarged, adnexa without tenderness, enlargement, or mass Skin: No rashes or wounds on exposed areas. Skin warm to touch.   FHT 182 by doppler initially and then 169 on repeat after IVF/Tylenol  LAB RESULTS Results for orders placed or performed during the hospital encounter of 05/14/19 (from the past 24 hour(s))  CBC     Status: None   Collection Time: 05/14/19  7:47 PM  Result Value Ref Range   WBC 4.7 4.0 - 10.5 K/uL   RBC 4.02 3.87 - 5.11 MIL/uL   Hemoglobin 12.1 12.0 - 15.0 g/dL   HCT 05/16/19 32.4 - 40.1 %   MCV 89.6 80.0 - 100.0 fL   MCH 30.1 26.0 - 34.0 pg   MCHC 33.6 30.0 - 36.0 g/dL   RDW 02.7 25.3 - 66.4 %   Platelets 158 150 - 400 K/uL   nRBC 0.0 0.0 - 0.2 %  Comprehensive metabolic panel     Status: Abnormal   Collection Time: 05/14/19  7:47 PM  Result Value Ref Range   Sodium 135 135 - 145 mmol/L   Potassium 3.8 3.5 - 5.1 mmol/L   Chloride 102 98 - 111 mmol/L   CO2 24 22 - 32 mmol/L   Glucose, Bld 106 (H) 70 - 99 mg/dL   BUN 9 6 - 20 mg/dL   Creatinine, Ser 05/16/19 0.44 - 1.00 mg/dL   Calcium 8.7 (L) 8.9 - 10.3 mg/dL   Total Protein 6.5 6.5 - 8.1 g/dL   Albumin 3.1 (L) 3.5 - 5.0 g/dL   AST 20 15 - 41 U/L   ALT 18 0 - 44  U/L   Alkaline Phosphatase 53 38 - 126 U/L   Total Bilirubin 0.7 0.3 - 1.2 mg/dL   GFR calc non Af Amer >60 >60 mL/min   GFR calc Af Amer >60 >60 mL/min   Anion gap 9 5 - 15  Magnesium     Status: None   Collection Time: 05/14/19  7:47 PM  Result Value Ref Range   Magnesium 2.0 1.7 -  2.4 mg/dL  Urinalysis, Routine w reflex microscopic     Status: Abnormal   Collection Time: 05/14/19  8:25 PM  Result Value Ref Range   Color, Urine AMBER (A) YELLOW   APPearance CLOUDY (A) CLEAR   Specific Gravity, Urine 1.023 1.005 - 1.030   pH 6.0 5.0 - 8.0   Glucose, UA NEGATIVE NEGATIVE mg/dL   Hgb urine dipstick NEGATIVE NEGATIVE   Bilirubin Urine NEGATIVE NEGATIVE   Ketones, ur 80 (A) NEGATIVE mg/dL   Protein, ur 093 (A) NEGATIVE mg/dL   Nitrite NEGATIVE NEGATIVE   Leukocytes,Ua MODERATE (A) NEGATIVE   RBC / HPF 0-5 0 - 5 RBC/hpf   WBC, UA 6-10 0 - 5 WBC/hpf   Bacteria, UA RARE (A) NONE SEEN   Squamous Epithelial / LPF 11-20 0 - 5   Mucus PRESENT     O/Positive/-- (03/26 1110)  IMAGING No results found.  MAU Management/MDM: Orders Placed This Encounter  Procedures  . Culture, Urine  . CBC  . Comprehensive metabolic panel  . Magnesium  . Urinalysis, Routine w reflex microscopic  . Airborne and Enteric precautions (UV disinfection)  . Discharge patient    Meds ordered this encounter  Medications  . metoCLOPramide (REGLAN) injection 10 mg  . acetaminophen (TYLENOL) tablet 1,000 mg  . sodium chloride 0.9 % bolus 2,000 mL  . ondansetron (ZOFRAN ODT) 4 MG disintegrating tablet    Sig: Take 1 tablet (4 mg total) by mouth every 6 (six) hours as needed for nausea.    Dispense:  20 tablet    Refill:  0  . metoCLOPramide (REGLAN) 5 MG tablet    Sig: Take 1 tablet (5 mg total) by mouth every 8 (eight) hours as needed for nausea or vomiting (headache).    Dispense:  30 tablet    Refill:  0    Patient presented with abdominal cramping, headache, cough, nausea and vomiting in setting  of COVID-19 infection. Reporting cramping without vaginal bleeding. Cervix closed/thick/hi. Patient tachycardic and febrile on initial vitals. FHR tachycardic as well. Labs drawn and patient given 2L IVF, Reglan and Tylenol. Labs reviewed without electrolyte abnormalities, AKI. CBC WNL. Vitals improved after treatment and fetal tachycardia also improved. Patient able to tolerate PO and felt comfortable going home. Abdominal cramping improved. Reglan and Zofran prescribed on discharge.  Pt discharged with strict return precautions.  ASSESSMENT 1. COVID-19 affecting pregnancy in second trimester   2. Encounter for supervision of normal first pregnancy in first trimester     PLAN Discharge home Allergies as of 05/14/2019   No Known Allergies     Medication List    STOP taking these medications   promethazine 25 MG tablet Commonly known as: PHENERGAN     TAKE these medications   cephALEXin 500 MG capsule Commonly known as: KEFLEX Take 1 capsule (500 mg total) by mouth 4 (four) times daily.   Comfort Fit Maternity Supp Med Misc 1 Device by Does not apply route daily.   cyclobenzaprine 5 MG tablet Commonly known as: FLEXERIL Take 1 tablet (5 mg total) by mouth 3 (three) times daily as needed for muscle spasms.   metoCLOPramide 5 MG tablet Commonly known as: Reglan Take 1 tablet (5 mg total) by mouth every 8 (eight) hours as needed for nausea or vomiting (headache).   ondansetron 4 MG disintegrating tablet Commonly known as: Zofran ODT Take 1 tablet (4 mg total) by mouth every 6 (six) hours as needed for nausea.   Prenate Mini  29-0.6-0.4-350 MG Caps Take 1 capsule by mouth daily before breakfast.      Follow-up Information    [facility] Follow up.   Specialty: Obstetrics and Gynecology Contact information: [address] [place], [place],  [facility], [facility] 05/14/2019  9:45 PM

## 2019-05-14 NOTE — MAU Note (Signed)
Arrived via EMS. Pt is G4P3 at [redacted]w[redacted]d. Pt complaining of abdominal cramps since midnight -says it feels like cntrx, every 5-6 mins. Denies VB or discharge. Denies urinary symptoms. Covid + since Wednesday.

## 2019-05-14 NOTE — Discharge Instructions (Signed)
10 Things You Can Do to Manage Your COVID-19 Symptoms at Home If you have possible or confirmed COVID-19: 1. Stay home from work and school. And stay away from other public places. If you must go out, avoid using any kind of public transportation, ridesharing, or taxis. 2. Monitor your symptoms carefully. If your symptoms get worse, call your healthcare provider immediately. 3. Get rest and stay hydrated. 4. If you have a medical appointment, call the healthcare provider ahead of time and tell them that you have or may have COVID-19. 5. For medical emergencies, call 911 and notify the dispatch personnel that you have or may have COVID-19. 6. Cover your cough and sneezes with a tissue or use the inside of your elbow. 7. Wash your hands often with soap and water for at least 20 seconds or clean your hands with an alcohol-based hand sanitizer that contains at least 60% alcohol. 8. As much as possible, stay in a specific room and away from other people in your home. Also, you should use a separate bathroom, if available. If you need to be around other people in or outside of the home, wear a mask. 9. Avoid sharing personal items with other people in your household, like dishes, towels, and bedding. 10. Clean all surfaces that are touched often, like counters, tabletops, and doorknobs. Use household cleaning sprays or wipes according to the label instructions. cdc.gov/coronavirus 07/15/2018 This information is not intended to replace advice given to you by your health care provider. Make sure you discuss any questions you have with your health care provider. Document Revised: 12/17/2018 Document Reviewed: 12/17/2018 Elsevier Patient Education  2020 Elsevier Inc.  

## 2019-05-16 ENCOUNTER — Telehealth: Payer: Self-pay

## 2019-05-16 ENCOUNTER — Encounter (INDEPENDENT_AMBULATORY_CARE_PROVIDER_SITE_OTHER): Payer: Self-pay

## 2019-05-16 LAB — URINE CULTURE: Culture: 10000 — AB

## 2019-05-16 NOTE — Telephone Encounter (Signed)
Called pt and LM on VM to call back to discuss sx. °

## 2019-05-16 NOTE — Telephone Encounter (Signed)
No return phone calls form pt. Sent message through [employer].  I just wanted to send you some guidance for when to see the doctor or call 911. If you have any questions, please call [phone]. Worsening cough: Marland Kitchen If cough remains the same or better: continue to treat with over the counter medications.  Hard candy or cough drops and drinking warm fluids. Adults can also use honey 2 tsp (10 ML) at bedtime.  Marland Kitchen Honey is not recommended for infants under one. . If cough is becoming worse even with the use of over the counter medications and patient is not able to sleep at night, cough becomes productive with sputum that maybe yellow or green in color, contact PCP .  Worsening weakness: . If patient has worsening weakness with inability to stand or if patient has to hold on to something to get balance, advise patient to call 911 and seek treatment in ED  Worsening appetite:  . develops severe vomiting (more than 6 times a day and or >8 hours) and/or severe abdominal pain advise patient If appetite becomes worse:   encourage patient to drink fluids as tolerated, work their way up to bland solid food such as crackers, pretzels, soup, bread or applesauce and boiled starches.  . If patient is unable to tolerate any foods or liquids, notify PCP. Marland Kitchen  If patient to call 911 and seek treatment in ED  Hope this helps and that you feel better! Calogero Geisen RN

## 2019-05-16 NOTE — Telephone Encounter (Signed)
Question 05/16/2019 1:31 PM EDT - Filed by Patient  Are you feeling short of breath today? Yes  Is the shortness of breath better, the same, or worse than yesterday? Same  Are you having a cough today? Yes  Is the cough better, the same, or worse than yesterday?  WorseAbnormal   Are you experiencing weakness today? Yes  Is the weakness better, the same, or worse than yesterday? WorseAbnormal   Are you vomiting? Yes  How is your appetite compared to yesterday? WorseAbnormal   Are you experiencing diarrhea?  No   Called pt abut unable to LM on VM "mailbox full." Sent pt message through MyChart to call back. Call back number provided.

## 2019-05-16 NOTE — Telephone Encounter (Signed)
Pt has read MyChart message: Me to Claudette, [place]     05/16/19 1:40 PM Carlyon Shadow an RN with [facility] and saw you answered that you are having worsening symptoms (cough, weakness, appetite).  I just called you but your mailbox is full, so I was not able to leave a message.  Please call me at [phone] to discuss your symptoms and we will discuss how to manage them.  If you are having chest pain, wheezing or difficulty breathing, please call 911.  Thank you, Tailor Westfall RN  Last read by [name] at 1:40 PM on 05/16/2019.

## 2019-05-17 ENCOUNTER — Encounter (INDEPENDENT_AMBULATORY_CARE_PROVIDER_SITE_OTHER): Payer: Self-pay

## 2019-05-17 ENCOUNTER — Telehealth: Payer: Self-pay

## 2019-05-17 NOTE — Telephone Encounter (Signed)
Patient triggered Covid-19 BPA for SOB, cough, vomiting. Called patient and she states that she is feeling SOB and it is worse than yesterday. She states it is when moving about.  She also states that she is coughing up thick brown to clear sputum.  She is [redacted] weeks pregnant and states that she can not keep liquids down.  She can not remember voiding since getting out of bed this AM. Patient was told to go to ER for evaluation of symptoms.  She states that she has a ride. She was advised to sip liquid. She verbalized understanding. Call report to [facility]. Triage nurse.

## 2019-05-18 ENCOUNTER — Encounter (INDEPENDENT_AMBULATORY_CARE_PROVIDER_SITE_OTHER): Payer: Self-pay

## 2019-05-19 ENCOUNTER — Other Ambulatory Visit: Payer: Self-pay | Admitting: Obstetrics

## 2019-05-19 DIAGNOSIS — K219 Gastro-esophageal reflux disease without esophagitis: Secondary | ICD-10-CM

## 2019-05-19 MED ORDER — PANTOPRAZOLE SODIUM 20 MG PO TBEC: 20.0000 mg | DELAYED_RELEASE_TABLET | Freq: Two times a day (BID) | ORAL | 5 refills | Status: DC

## 2019-05-20 ENCOUNTER — Telehealth: Payer: Self-pay

## 2019-05-20 ENCOUNTER — Emergency Department (HOSPITAL_COMMUNITY)
Admission: AD | Admit: 2019-05-20 | Discharge: 2019-05-20 | Disposition: A | Discharge status: Home / Self Care | Payer: Medicaid Other | Attending: Emergency Medicine | Admitting: Emergency Medicine

## 2019-05-20 ENCOUNTER — Encounter (INDEPENDENT_AMBULATORY_CARE_PROVIDER_SITE_OTHER): Payer: Self-pay

## 2019-05-20 DIAGNOSIS — O98512 Other viral diseases complicating pregnancy, second trimester: Secondary | ICD-10-CM | POA: Diagnosis present

## 2019-05-20 DIAGNOSIS — Z5321 Procedure and treatment not carried out due to patient leaving prior to being seen by health care provider: Secondary | ICD-10-CM | POA: Diagnosis not present

## 2019-05-20 DIAGNOSIS — Z3A17 17 weeks gestation of pregnancy: Secondary | ICD-10-CM | POA: Insufficient documentation

## 2019-05-20 DIAGNOSIS — U071 COVID-19: Secondary | ICD-10-CM | POA: Insufficient documentation

## 2019-05-20 NOTE — MAU Note (Signed)
+  covid last Wed.  Still having the same lower abd pain.  Every time she coughs, she coughs up blood. Denies nose bleed, no vag bleeding. Denies UTI symptoms or diarrhea.

## 2019-05-20 NOTE — ED Triage Notes (Signed)
Pt st's she is 17 weeks preg. And was having abd pain.  Pt was seen at [facility] and cleared to come to ED for cough, coughing up blood and Covid.  Pt st's she was tested positive last week  Pt speaking in full sentences

## 2019-05-20 NOTE — ED Notes (Signed)
Pt stated she is not going to wait, she will come back in the morning.

## 2019-05-20 NOTE — MAU Provider Note (Signed)
  S Ms. Chelsea Wallace is a 29 y.o. [phone] pregnant female at 17 weeks 5 days EGA who presents to MAU today with complaint of hemoptysis in the context of recent COVID19 diagnosis.  She denies vaginal bleeding, contractions, or leakage of fluid. She reports shortness of breath on exertion and difficulty breathing with specks of bbright red blood when she coughs. Denies nose bleeds or vomiting.  O BP 108/73 (BP Location: Right Arm)   Pulse (!) 118   Temp 98.3 F (36.8 C) (Oral)   Resp (!) 24   Wt 78.9 kg   LMP 01/11/2019 (Approximate)   SpO2 98%   BMI 28.94 kg/m  Physical Exam  Nursing note and vitals reviewed. Constitutional: She is oriented to person, place, and time. She appears well-developed and well-nourished.  Ill-appearing  HENT:  Head: Normocephalic and atraumatic.  Eyes: Pupils are equal, round, and reactive to light. Conjunctivae and EOM are normal.  Respiratory: No respiratory distress.  Intermittent coughing behind her mask  GI: Soft. Bowel sounds are normal. She exhibits no distension and no mass. There is no abdominal tenderness. There is no rebound and no guarding.  FHR doppler 140 by BSUS  Musculoskeletal:        General: Normal range of motion.     Cervical back: Normal range of motion and neck supple.  Neurological: She is alert and oriented to person, place, and time. She has normal reflexes.  Skin: Skin is warm and dry.  Psychiatric: She has a normal mood and affect. Her behavior is normal. Judgment and thought content normal.    A [redacted] weeks gestation COVID19+ Medical screening exam complete  P Report called to Dr. Judd Lien in [facility] for transfer, patient accepted to [facility]. Patient may return to MAU as needed for pregnancy related complaints  Danon Lograsso L, DO 05/20/2019 6:22 PM

## 2019-05-20 NOTE — Telephone Encounter (Addendum)
Left a vm for  patient to callback to advise on the sob and cough that has gotten worse.     Shortness of breath is the same: continue to monitor at home   If symptoms become severe, i.e. shortness of breath at rest, gasping for air, wheezing, CALL 911 AND SEEK TREATMENT IN THE ED    If cough remains the same or better: continue to treat with over the counter medications. Hard candy or cough drops and drinking warm fluids. Adults can also use honey 2 tsp (10 ML) at bedtime.   HONEY IS NOT RECOMMENDED FOR INFANTS UNDER ONE.   If cough is becoming worse even with the use of over the counter medications and patient is not able to sleep at night, cough becomes productive with sputum that maybe yellow or green in color, contact PCP.

## 2019-05-24 NOTE — Progress Notes (Signed)
Work note for patient who is COVID-19 positive.

## 2019-05-25 ENCOUNTER — Other Ambulatory Visit: Payer: Self-pay

## 2019-05-25 ENCOUNTER — Ambulatory Visit: Payer: Medicaid Other | Attending: Obstetrics

## 2019-05-25 ENCOUNTER — Other Ambulatory Visit: Payer: Self-pay | Admitting: *Deleted

## 2019-05-25 ENCOUNTER — Ambulatory Visit: Payer: Medicaid Other | Admitting: *Deleted

## 2019-05-25 DIAGNOSIS — Z148 Genetic carrier of other disease: Secondary | ICD-10-CM | POA: Diagnosis not present

## 2019-05-25 DIAGNOSIS — Z3401 Encounter for supervision of normal first pregnancy, first trimester: Secondary | ICD-10-CM | POA: Insufficient documentation

## 2019-05-25 DIAGNOSIS — Z3A18 18 weeks gestation of pregnancy: Secondary | ICD-10-CM | POA: Diagnosis not present

## 2019-05-25 DIAGNOSIS — Z363 Encounter for antenatal screening for malformations: Secondary | ICD-10-CM

## 2019-05-25 DIAGNOSIS — Z362 Encounter for other antenatal screening follow-up: Secondary | ICD-10-CM

## 2019-05-28 ENCOUNTER — Ambulatory Visit: Payer: Medicaid Other

## 2019-05-28 ENCOUNTER — Other Ambulatory Visit: Payer: Medicaid Other

## 2019-06-04 ENCOUNTER — Telehealth (INDEPENDENT_AMBULATORY_CARE_PROVIDER_SITE_OTHER): Payer: Medicaid Other | Admitting: Obstetrics

## 2019-06-04 DIAGNOSIS — Z3480 Encounter for supervision of other normal pregnancy, unspecified trimester: Secondary | ICD-10-CM

## 2019-06-04 NOTE — Progress Notes (Signed)
Patient did not answer nurse's phone call.  Brock Bad, MD 06/04/2019 11:22 AM

## 2019-06-11 ENCOUNTER — Encounter: Payer: Self-pay | Admitting: Obstetrics

## 2019-06-11 ENCOUNTER — Telehealth (INDEPENDENT_AMBULATORY_CARE_PROVIDER_SITE_OTHER): Payer: Medicaid Other | Admitting: Obstetrics

## 2019-06-11 VITALS — BP 110/69 | HR 88

## 2019-06-11 DIAGNOSIS — Z3401 Encounter for supervision of normal first pregnancy, first trimester: Secondary | ICD-10-CM

## 2019-06-11 DIAGNOSIS — O99282 Endocrine, nutritional and metabolic diseases complicating pregnancy, second trimester: Secondary | ICD-10-CM

## 2019-06-11 DIAGNOSIS — K219 Gastro-esophageal reflux disease without esophagitis: Secondary | ICD-10-CM

## 2019-06-11 DIAGNOSIS — Z3A20 20 weeks gestation of pregnancy: Secondary | ICD-10-CM | POA: Diagnosis not present

## 2019-06-11 MED ORDER — FAMOTIDINE 20 MG PO TABS: 20.0000 mg | ORAL_TABLET | Freq: Two times a day (BID) | ORAL | 5 refills | Status: DC

## 2019-06-11 NOTE — Progress Notes (Signed)
   TELEHEALTH OBSTETRICS VISIT ENCOUNTER NOTE  I connected with Chelsea Wallace on 06/11/19 at  9:45 AM EDT by telephone at home and verified that I am speaking with the correct person using two identifiers.   I discussed the limitations, risks, security and privacy concerns of performing an evaluation and management service by telephone and the availability of in person appointments. I also discussed with the patient that there may be a patient responsible charge related to this service. The patient expressed understanding and agreed to proceed.  Subjective:  Chelsea Wallace is a 29 y.o. G4P3003 at [redacted]w[redacted]d being followed for ongoing prenatal care.  She is currently monitored for the following issues for this low-risk pregnancy and has BMI 27.0-27.9,adult; Depo-Provera contraceptive status; and Encounter for supervision of normal first pregnancy in first trimester on their problem list.  Patient reports no fetal movement. Reports fetal movement. Denies any contractions, bleeding or leaking of fluid.   The following portions of the patient's history were reviewed and updated as appropriate: allergies, current medications, past family history, past medical history, past social history, past surgical history and problem list.   Objective:   General:  Alert, oriented and cooperative.   Mental Status: Normal mood and affect perceived. Normal judgment and thought content.  Rest of physical exam deferred due to type of encounter  Assessment and Plan:  Pregnancy: G4P3003 at [redacted]w[redacted]d 1. Encounter for supervision of normal first pregnancy in first trimester - absence of fetal movement.  Sent to [facility] for evaluation  2. GERD without esophagitis Rx: - famotidine (PEPCID) 20 MG tablet; Take 1 tablet (20 mg total) by mouth 2 (two) times daily.  Dispense: 60 tablet; Refill: 5   Preterm labor symptoms and general obstetric precautions including but not limited to vaginal bleeding, contractions, leaking  of fluid and fetal movement were reviewed in detail with the patient.  I discussed the assessment and treatment plan with the patient. The patient was provided an opportunity to ask questions and all were answered. The patient agreed with the plan and demonstrated an understanding of the instructions. The patient was advised to call back or seek an in-person office evaluation/go to MAU at [facility] for any urgent or concerning symptoms. Please refer to After Visit Summary for other counseling recommendations.   I provided 10 minutes of non-face-to-face time during this encounter.  Return in about 4 weeks (around 07/09/2019) for MyChart.  Future Appointments  Date Time Provider Department Center  06/25/2019 10:45 AM [facility] NURSE [facility]  06/25/2019 10:45 AM [facility] US4 [facility]    Coral Ceo, MD [facility] [place], [facility] 06/11/2019

## 2019-06-11 NOTE — Progress Notes (Signed)
Pt is on the phone preparing for virtual visit with provider. [redacted]w[redacted]d. Pt denies fetal movement, advised pt to go to MAU for evaluation per Dr. Clearance Coots. Pt voices understanding.

## 2019-06-25 ENCOUNTER — Other Ambulatory Visit: Payer: Self-pay

## 2019-06-25 ENCOUNTER — Ambulatory Visit: Payer: Medicaid Other | Admitting: *Deleted

## 2019-06-25 ENCOUNTER — Ambulatory Visit: Payer: Medicaid Other | Attending: Obstetrics and Gynecology

## 2019-06-25 VITALS — BP 110/64 | HR 96

## 2019-06-25 DIAGNOSIS — Z3401 Encounter for supervision of normal first pregnancy, first trimester: Secondary | ICD-10-CM | POA: Diagnosis present

## 2019-06-25 DIAGNOSIS — O0990 Supervision of high risk pregnancy, unspecified, unspecified trimester: Secondary | ICD-10-CM | POA: Diagnosis present

## 2019-06-25 DIAGNOSIS — Z362 Encounter for other antenatal screening follow-up: Secondary | ICD-10-CM | POA: Insufficient documentation

## 2019-06-25 DIAGNOSIS — Z3A22 22 weeks gestation of pregnancy: Secondary | ICD-10-CM

## 2019-06-25 DIAGNOSIS — Z148 Genetic carrier of other disease: Secondary | ICD-10-CM | POA: Diagnosis not present

## 2019-07-09 ENCOUNTER — Encounter: Payer: Self-pay | Admitting: Obstetrics

## 2019-07-09 ENCOUNTER — Telehealth (INDEPENDENT_AMBULATORY_CARE_PROVIDER_SITE_OTHER): Payer: Medicaid Other | Admitting: Obstetrics

## 2019-07-09 DIAGNOSIS — Z3A24 24 weeks gestation of pregnancy: Secondary | ICD-10-CM

## 2019-07-09 DIAGNOSIS — Z3480 Encounter for supervision of other normal pregnancy, unspecified trimester: Secondary | ICD-10-CM

## 2019-07-09 DIAGNOSIS — Z3482 Encounter for supervision of other normal pregnancy, second trimester: Secondary | ICD-10-CM

## 2019-07-09 NOTE — Progress Notes (Signed)
Virtual Visit via Telephone Note  I connected with Chelsea Wallace on 07/09/19 at 10:00 AM EDT by telephone and verified that I am speaking with the correct person using two identifiers.  Unable to check BP she's not at home Pt c/o increase vaginal discharge, no odor, no itching

## 2019-07-09 NOTE — Progress Notes (Signed)
   TELEHEALTH OBSTETRICS VISIT ENCOUNTER NOTE  I connected with [name] on 07/09/19 at 10:00 AM EDT by telephone at home and verified that I am speaking with the correct person using two identifiers.   I discussed the limitations, risks, security and privacy concerns of performing an evaluation and management service by telephone and the availability of in person appointments. I also discussed with the patient that there may be a patient responsible charge related to this service. The patient expressed understanding and agreed to proceed.  Subjective:  Chelsea Wallace is a 29 y.o. G4P3003 at [redacted]w[redacted]d being followed for ongoing prenatal care.  She is currently monitored for the following issues for this low-risk pregnancy and has BMI 27.0-27.9,adult; Depo-Provera contraceptive status; and Encounter for supervision of normal first pregnancy in first trimester on their problem list.  Patient reports backache and heartburn. Reports fetal movement. Denies any contractions, bleeding or leaking of fluid.   The following portions of the patient's history were reviewed and updated as appropriate: allergies, current medications, past family history, past medical history, past social history, past surgical history and problem list.   Objective:   General:  Alert, oriented and cooperative.   Mental Status: Normal mood and affect perceived. Normal judgment and thought content.  Rest of physical exam deferred due to type of encounter  Assessment and Plan:  Pregnancy: G4P3003 at [redacted]w[redacted]d  1. Supervision of other normal pregnancy, antepartum  There are no diagnoses linked to this encounter. Preterm labor symptoms and general obstetric precautions including but not limited to vaginal bleeding, contractions, leaking of fluid and fetal movement were reviewed in detail with the patient.  I discussed the assessment and treatment plan with the patient. The patient was provided an opportunity to ask  questions and all were answered. The patient agreed with the plan and demonstrated an understanding of the instructions. The patient was advised to call back or seek an in-person office evaluation/go to [facility]U at [facility] for any urgent or concerning symptoms. Please refer to After Visit Summary for other counseling recommendations.   I provided 10 minutes of non-face-to-face time during this encounter.  Return in about 4 weeks (around 08/06/2019) for ROB, 2 hour OGTT.   Coral Ceo, MD Center for [facility], [facility] 07/09/19

## 2019-07-14 ENCOUNTER — Encounter: Payer: Self-pay | Admitting: Obstetrics and Gynecology

## 2019-07-27 ENCOUNTER — Other Ambulatory Visit: Payer: Self-pay

## 2019-07-27 DIAGNOSIS — N898 Other specified noninflammatory disorders of vagina: Secondary | ICD-10-CM

## 2019-07-27 DIAGNOSIS — Z3401 Encounter for supervision of normal first pregnancy, first trimester: Secondary | ICD-10-CM

## 2019-07-27 MED ORDER — PRENATE MINI 29-0.6-0.4-350 MG PO CAPS: 1.0000 | ORAL_CAPSULE | Freq: Every day | ORAL | 4 refills | Status: DC

## 2019-07-27 MED ORDER — METRONIDAZOLE 500 MG PO TABS: 500.0000 mg | ORAL_TABLET | Freq: Two times a day (BID) | ORAL | 0 refills | Status: DC

## 2019-07-29 ENCOUNTER — Other Ambulatory Visit: Payer: Self-pay

## 2019-07-29 DIAGNOSIS — N898 Other specified noninflammatory disorders of vagina: Secondary | ICD-10-CM

## 2019-07-29 MED ORDER — TERCONAZOLE 0.8 % VA CREA: 1.0000 | TOPICAL_CREAM | Freq: Every day | VAGINAL | 0 refills | Status: DC

## 2019-07-29 NOTE — Progress Notes (Signed)
Pt was sent BV Rx Called today to request yeast Rx. Rx sent per protocol.  Pt sent Mychart message.

## 2019-08-06 ENCOUNTER — Ambulatory Visit (INDEPENDENT_AMBULATORY_CARE_PROVIDER_SITE_OTHER): Payer: Medicaid Other | Admitting: Obstetrics and Gynecology

## 2019-08-06 ENCOUNTER — Other Ambulatory Visit: Payer: Medicaid Other

## 2019-08-06 ENCOUNTER — Other Ambulatory Visit: Payer: Self-pay

## 2019-08-06 DIAGNOSIS — Z3480 Encounter for supervision of other normal pregnancy, unspecified trimester: Secondary | ICD-10-CM

## 2019-08-06 DIAGNOSIS — Z3483 Encounter for supervision of other normal pregnancy, third trimester: Secondary | ICD-10-CM

## 2019-08-06 NOTE — Progress Notes (Signed)
   PRENATAL VISIT NOTE  Subjective:  Chelsea Wallace is a 29 y.o. G4P3003 at [redacted]w[redacted]d being seen today for ongoing prenatal care.  She is currently monitored for the following issues for this low-risk pregnancy and has BMI 27.0-27.9,adult; Depo-Provera contraceptive status; and Supervision of other normal pregnancy, antepartum on their problem list.  Patient doing well with no acute concerns today. She reports backache.  Contractions: Not present. Vag. Bleeding: None.  Movement: Present. Denies leaking of fluid.  Pt notes occasional pelvic pressure and right sided low back pain.  She has tried tylenol with minimal effect.  She already owns a belly band.  The following portions of the patient's history were reviewed and updated as appropriate: allergies, current medications, past family history, past medical history, past social history, past surgical history and problem list. Problem list updated.  Objective:   Vitals:   08/06/19 0939  BP: 122/77  Pulse: (!) 118  Weight: 195 lb (88.5 kg)    Fetal Status: Fetal Heart Rate (bpm): 148 Fundal Height: 28 cm Movement: Present     General:  Alert, oriented and cooperative. Patient is in no acute distress.  Skin: Skin is warm and dry. No rash noted.   Cardiovascular: Normal heart rate noted  Respiratory: Normal respiratory effort, no problems with respiration noted  Abdomen: Soft, gravid, appropriate for gestational age.  Pain/Pressure: Present     Pelvic: Cervical exam deferred        Extremities: Normal range of motion.  Edema: None  Mental Status:  Normal mood and affect. Normal behavior. Normal judgment and thought content.   Assessment and Plan:  Pregnancy: G4P3003 at [redacted]w[redacted]d  1. Supervision of other normal pregnancy, antepartum 28 week labs today with 2 hour GTT  Preterm labor symptoms and general obstetric precautions including but not limited to vaginal bleeding, contractions, leaking of fluid and fetal movement were reviewed in  detail with the patient.  Please refer to After Visit Summary for other counseling recommendations.   Return in about 2 weeks (around 08/20/2019) for LOB, in person.   Mariel Aloe, MD

## 2019-08-06 NOTE — Progress Notes (Signed)
Patient reports fetal movement and pressure and pain. Pt reports that maternity belt has not helped.

## 2019-08-07 LAB — CBC
Hematocrit: 34.3 % (ref 34.0–46.6)
Hemoglobin: 10.8 g/dL — ABNORMAL LOW (ref 11.1–15.9)
MCH: 29.4 pg (ref 26.6–33.0)
MCHC: 31.5 g/dL (ref 31.5–35.7)
MCV: 94 fL (ref 79–97)
Platelets: 168 10*3/uL (ref 150–450)
RBC: 3.67 x10E6/uL — ABNORMAL LOW (ref 3.77–5.28)
RDW: 13.7 % (ref 11.7–15.4)
WBC: 8.3 10*3/uL (ref 3.4–10.8)

## 2019-08-07 LAB — GLUCOSE TOLERANCE, 2 HOURS W/ 1HR
Glucose, 1 hour: 146 mg/dL (ref 65–179)
Glucose, 2 hour: 120 mg/dL (ref 65–152)
Glucose, Fasting: 83 mg/dL (ref 65–91)

## 2019-08-07 LAB — RPR: RPR Ser Ql: NONREACTIVE

## 2019-08-07 LAB — HIV ANTIBODY (ROUTINE TESTING W REFLEX): HIV Screen 4th Generation wRfx: NONREACTIVE

## 2019-08-19 ENCOUNTER — Encounter: Payer: Self-pay | Admitting: Medical

## 2019-08-19 DIAGNOSIS — Z148 Genetic carrier of other disease: Secondary | ICD-10-CM | POA: Insufficient documentation

## 2019-08-20 ENCOUNTER — Other Ambulatory Visit (HOSPITAL_COMMUNITY)
Admission: RE | Admit: 2019-08-20 | Discharge: 2019-08-20 | Disposition: A | Discharge status: Home / Self Care | Payer: Medicaid Other | Source: Ambulatory Visit | Attending: Medical | Admitting: Medical

## 2019-08-20 ENCOUNTER — Other Ambulatory Visit: Payer: Self-pay

## 2019-08-20 ENCOUNTER — Ambulatory Visit (INDEPENDENT_AMBULATORY_CARE_PROVIDER_SITE_OTHER): Payer: Medicaid Other | Admitting: Certified Nurse Midwife

## 2019-08-20 DIAGNOSIS — Z3480 Encounter for supervision of other normal pregnancy, unspecified trimester: Secondary | ICD-10-CM

## 2019-08-20 LAB — POCT FERNING: Ferning, POC: NEGATIVE

## 2019-08-20 MED ORDER — OMEPRAZOLE 20 MG PO CPDR: 20.0000 mg | DELAYED_RELEASE_CAPSULE | Freq: Two times a day (BID) | ORAL | 2 refills | Status: DC

## 2019-08-20 NOTE — Patient Instructions (Signed)

## 2019-08-20 NOTE — Progress Notes (Signed)
   PRENATAL VISIT NOTE  Subjective:  Chelsea Wallace is a 29 y.o. G4P3003 at [redacted]w[redacted]d being seen today for ongoing prenatal care.  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  Patient reports no complaints other than continued heavy discharge that feels like she is leaking fluids. She has had several episodes of soaking her underwear and pad upon standing.  Contractions: Not present. Vag. Bleeding: None.  Movement: Present. Denies leaking of fluid.   The following portions of the patient's history were reviewed and updated as appropriate: allergies, current medications, past family history, past medical history, past social history, past surgical history and problem list.   Objective:   Vitals:   08/20/19 1005  BP: 122/76  Pulse: 91  Weight: 194 lb 12.8 oz (88.4 kg)    Fetal Status: Fetal Heart Rate (bpm): 136 Fundal Height: 30 cm Movement: Present     General:  Alert, oriented and cooperative. Patient is in no acute distress.  Skin: Skin is warm and dry. No rash noted.   Cardiovascular: Normal heart rate noted  Respiratory: Normal respiratory effort, no problems with respiration noted  Abdomen: Soft, gravid, appropriate for gestational age.  Pain/Pressure: Present     Pelvic: Cervix visually closed, copious thick white-yellow discharge.        Extremities: Normal range of motion.  Edema: None  Mental Status: Normal mood and affect. Normal behavior. Normal judgment and thought content.   Assessment and Plan:  Pregnancy: G4P3003 at [redacted]w[redacted]d 1. Supervision of other normal pregnancy, antepartum - Wet prep, gc/ct done again since pt is still symptomatic - Fern test negative - Advised to go to MAU if another "soaking" event happens  Preterm labor symptoms and general obstetric precautions including but not limited to vaginal  bleeding, contractions, leaking of fluid and fetal movement were reviewed in detail with the patient. Please refer to After Visit Summary for other counseling recommendations.   Return in about 2 weeks (around 09/03/2019) for LOB, VIRTUAL.  No future appointments.  Bernerd Limbo, CNM

## 2019-08-20 NOTE — Addendum Note (Signed)
Addended by: Dalphine Handing on: 08/20/2019 12:10 PM   Modules accepted: Orders

## 2019-08-20 NOTE — Progress Notes (Signed)
Pt presents for routine OB visit c/o heartburn no relief with medications Tdap offered; pt reqs to defer till next visit

## 2019-08-22 LAB — CERVICOVAGINAL ANCILLARY ONLY
Bacterial Vaginitis (gardnerella): NEGATIVE
Candida Glabrata: NEGATIVE
Candida Vaginitis: NEGATIVE
Chlamydia: NEGATIVE
Comment: NEGATIVE
Comment: NEGATIVE
Comment: NEGATIVE
Comment: NEGATIVE
Comment: NEGATIVE
Comment: NORMAL
Neisseria Gonorrhea: NEGATIVE
Trichomonas: NEGATIVE

## 2019-09-03 ENCOUNTER — Encounter: Payer: Self-pay | Admitting: Obstetrics

## 2019-09-03 ENCOUNTER — Telehealth (INDEPENDENT_AMBULATORY_CARE_PROVIDER_SITE_OTHER): Payer: Medicaid Other | Admitting: Obstetrics

## 2019-09-03 DIAGNOSIS — Z3480 Encounter for supervision of other normal pregnancy, unspecified trimester: Secondary | ICD-10-CM

## 2019-09-03 DIAGNOSIS — Z148 Genetic carrier of other disease: Secondary | ICD-10-CM

## 2019-09-03 DIAGNOSIS — Z3A32 32 weeks gestation of pregnancy: Secondary | ICD-10-CM

## 2019-09-03 NOTE — Progress Notes (Addendum)
I connected with  Chelsea Wallace on 09/03/19 by a video enabled telemedicine application and verified that I am speaking with the correct person using two identifiers.   I discussed the limitations of evaluation and management by telemedicine. The patient expressed understanding and agreed to proceed.   Mychart ROB c/o back pain, pressure 8-9/10, LOF/gush when she stands up, she called the Hospital on Wednesday and was told to not come to the Hospital and wait until her appointment with you. She is unable to check her BP at this time.

## 2019-09-03 NOTE — Progress Notes (Signed)
   OBSTETRICS PRENATAL VIRTUAL VISIT ENCOUNTER NOTE  Provider location: Center for [facility]   I connected with Chelsea Wallace on 09/03/19 at  9:30 AM EDT by MyChart Video Encounter at home and verified that I am speaking with the correct person using two identifiers.   I discussed the limitations, risks, security and privacy concerns of performing an evaluation and management service virtually and the availability of in person appointments. I also discussed with the patient that there may be a patient responsible charge related to this service. The patient expressed understanding and agreed to proceed. Subjective:  Chelsea Wallace is a 29 y.o. G4P3003 at [redacted]w[redacted]d being seen today for ongoing prenatal care.  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  Patient reports backache.  Contractions: Irregular. Vag. Bleeding: None.  Movement: Present. Denies any leaking of fluid.   The following portions of the patient's history were reviewed and updated as appropriate: allergies, current medications, past family history, past medical history, past social history, past surgical history and problem list.   Objective:  There were no vitals filed for this visit.  Fetal Status:     Movement: Present     General:  Alert, oriented and cooperative. Patient is in no acute distress.  Respiratory: Normal respiratory effort, no problems with respiration noted  Mental Status: Normal mood and affect. Normal behavior. Normal judgment and thought content.  Rest of physical exam deferred due to type of encounter  Imaging: No results found.  Assessment and Plan:  Pregnancy: G4P3003 at [redacted]w[redacted]d 1. Supervision of other normal pregnancy, antepartum  2. Genetic carrier   Preterm labor symptoms and general obstetric precautions including  but not limited to vaginal bleeding, contractions, leaking of fluid and fetal movement were reviewed in detail with the patient. I discussed the assessment and treatment plan with the patient. The patient was provided an opportunity to ask questions and all were answered. The patient agreed with the plan and demonstrated an understanding of the instructions. The patient was advised to call back or seek an in-person office evaluation/go to MAU at [facility] for any urgent or concerning symptoms. Please refer to After Visit Summary for other counseling recommendations.   I provided 10 minutes of face-to-face time during this encounter.  Return in about 1 week (around 09/10/2019) for ROB with midwife.  Future Appointments  Date Time Provider Department Center  09/13/2019 10:55 AM [place], Wilmer Floor, CNM [facility] None    Coral Ceo, MD Center for [facility], [facility] 09/03/19

## 2019-09-13 ENCOUNTER — Encounter: Payer: Self-pay | Admitting: *Deleted

## 2019-09-13 ENCOUNTER — Ambulatory Visit (INDEPENDENT_AMBULATORY_CARE_PROVIDER_SITE_OTHER): Payer: Medicaid Other | Admitting: Advanced Practice Midwife

## 2019-09-13 ENCOUNTER — Other Ambulatory Visit: Payer: Self-pay

## 2019-09-13 VITALS — BP 117/72 | HR 93 | Wt 200.2 lb

## 2019-09-13 DIAGNOSIS — O99891 Other specified diseases and conditions complicating pregnancy: Secondary | ICD-10-CM

## 2019-09-13 DIAGNOSIS — Z3480 Encounter for supervision of other normal pregnancy, unspecified trimester: Secondary | ICD-10-CM

## 2019-09-13 DIAGNOSIS — M549 Dorsalgia, unspecified: Secondary | ICD-10-CM

## 2019-09-13 DIAGNOSIS — Z8616 Personal history of COVID-19: Secondary | ICD-10-CM

## 2019-09-13 DIAGNOSIS — Z7189 Other specified counseling: Secondary | ICD-10-CM

## 2019-09-13 DIAGNOSIS — Z3A34 34 weeks gestation of pregnancy: Secondary | ICD-10-CM

## 2019-09-13 DIAGNOSIS — W19XXXA Unspecified fall, initial encounter: Secondary | ICD-10-CM

## 2019-09-13 NOTE — Progress Notes (Signed)
   PRENATAL VISIT NOTE  Subjective:  Chelsea Wallace is a 29 y.o. G4P3003 at [redacted]w[redacted]d being seen today for ongoing prenatal care.  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  Patient reports occasional contractions and pain in back/pelvis from fall this morning.  Contractions: Irregular. Vag. Bleeding: None.  Movement: Present. Denies leaking of fluid.   The following portions of the patient's history were reviewed and updated as appropriate: allergies, current medications, past family history, past medical history, past social history, past surgical history and problem list.   Objective:   Vitals:   09/13/19 1114  BP: 117/72  Pulse: 93  Weight: 200 lb 3.2 oz (90.8 kg)    Fetal Status: Fetal Heart Rate (bpm): 139   Movement: Present     General:  Alert, oriented and cooperative. Patient is in no acute distress.  Skin: Skin is warm and dry. No rash noted.   Cardiovascular: Normal heart rate noted  Respiratory: Normal respiratory effort, no problems with respiration noted  Abdomen: Soft, gravid, appropriate for gestational age.  Pain/Pressure: Present     Pelvic: Cervical exam deferred        Extremities: Normal range of motion.  Edema: Trace  Mental Status: Normal mood and affect. Normal behavior. Normal judgment and thought content.       Assessment and Plan:  Pregnancy: G4P3003 at [redacted]w[redacted]d 1. Supervision of other normal pregnancy, antepartum --Anticipatory guidance about next visits/weeks of pregnancy given. --Next visit in 2 weeks in office for GBS  --The patient was counseled on the potential benefits and lack of known risks of COVID vaccination, during pregnancy and breastfeeding, on today's visit. The patient's questions and concerns were addressed today, including safety of returning to work. The patient is still  unsure of her decision for vaccination. The patient is aware that if she chooses not to get an employee mandated vaccination we will provide documentation for her employers in the form of a letter unless a specific exemption form is submitted to the provider.  --Pt had COVID 19 in April. Discussed how vaccine is still recommended.   2. [redacted] weeks gestation of pregnancy   3. Fall, initial encounter --Pt fell getting out of shower at 4 am this morning, she slipped and landed on her buttocks/back.  She did not hit her abdomen. --She reports back/pelvic pain worse after the fall --There is good fetal movement and fall was 8+ hours ago. --FHT normal in office today. --Precautions/reasons to seek care reviewed.  4. Back pain affecting pregnancy in third trimester --Pt has been working from home, was supposed to return to office at [employer] this week.  She has difficulty sitting at desk at work due to pain and has letter from Dr Clearance Coots but her work will not accommodate.  She may be sending short term disability paperwork or asking for additional letter requesting limited work hours if she decides to return to the office.  I will approve working from home, limiting hours in the office, or short term disability.  Preterm labor symptoms and general obstetric precautions including but not limited to vaginal bleeding, contractions, leaking of fluid and fetal movement were reviewed in detail with the patient. Please refer to After Visit Summary for other counseling recommendations.   Return in about 2 weeks (around 09/27/2019).  No future appointments.  Sharen Counter, CNM

## 2019-09-13 NOTE — Patient Instructions (Signed)
Third Trimester of Pregnancy The third trimester is from week 28 through week 40 (months 7 through 9). The third trimester is a time when the unborn baby (fetus) is growing rapidly. At the end of the ninth month, the fetus is about 20 inches in length and weighs 6-10 pounds. Body changes during your third trimester Your body will continue to go through many changes during pregnancy. The changes vary from woman to woman. During the third trimester:  Your weight will continue to increase. You can expect to gain 25-35 pounds (11-16 kg) by the end of the pregnancy.  You may begin to get stretch marks on your hips, abdomen, and breasts.  You may urinate more often because the fetus is moving lower into your pelvis and pressing on your bladder.  You may develop or continue to have heartburn. This is caused by increased hormones that slow down muscles in the digestive tract.  You may develop or continue to have constipation because increased hormones slow digestion and cause the muscles that push waste through your intestines to relax.  You may develop hemorrhoids. These are swollen veins (varicose veins) in the rectum that can itch or be painful.  You may develop swollen, bulging veins (varicose veins) in your legs.  You may have increased body aches in the pelvis, back, or thighs. This is due to weight gain and increased hormones that are relaxing your joints.  You may have changes in your hair. These can include thickening of your hair, rapid growth, and changes in texture. Some women also have hair loss during or after pregnancy, or hair that feels dry or thin. Your hair will most likely return to normal after your baby is born.  Your breasts will continue to grow and they will continue to become tender. A yellow fluid (colostrum) may leak from your breasts. This is the first milk you are producing for your baby.  Your belly button may stick out.  You may notice more swelling in your hands,  face, or ankles.  You may have increased tingling or numbness in your hands, arms, and legs. The skin on your belly may also feel numb.  You may feel short of breath because of your expanding uterus.  You may have more problems sleeping. This can be caused by the size of your belly, increased need to urinate, and an increase in your body's metabolism.  You may notice the fetus "dropping," or moving lower in your abdomen (lightening).  You may have increased vaginal discharge.  You may notice your joints feel loose and you may have pain around your pelvic bone. What to expect at prenatal visits You will have prenatal exams every 2 weeks until week 36. Then you will have weekly prenatal exams. During a routine prenatal visit:  You will be weighed to make sure you and the baby are growing normally.  Your blood pressure will be taken.  Your abdomen will be measured to track your baby's growth.  The fetal heartbeat will be listened to.  Any test results from the previous visit will be discussed.  You may have a cervical check near your due date to see if your cervix has softened or thinned (effaced).  You will be tested for Group B streptococcus. This happens between 35 and 37 weeks. Your health care provider may ask you:  What your birth plan is.  How you are feeling.  If you are feeling the baby move.  If you have had any abnormal   symptoms, such as leaking fluid, bleeding, severe headaches, or abdominal cramping.  If you are using any tobacco products, including cigarettes, chewing tobacco, and electronic cigarettes.  If you have any questions. Other tests or screenings that may be performed during your third trimester include:  Blood tests that check for low iron levels (anemia).  Fetal testing to check the health, activity level, and growth of the fetus. Testing is done if you have certain medical conditions or if there are problems during the pregnancy.  Nonstress test  (NST). This test checks the health of your baby to make sure there are no signs of problems, such as the baby not getting enough oxygen. During this test, a belt is placed around your belly. The baby is made to move, and its heart rate is monitored during movement. What is false labor? False labor is a condition in which you feel small, irregular tightenings of the muscles in the womb (contractions) that usually go away with rest, changing position, or drinking water. These are called Braxton Hicks contractions. Contractions may last for hours, days, or even weeks before true labor sets in. If contractions come at regular intervals, become more frequent, increase in intensity, or become painful, you should see your health care provider. What are the signs of labor?  Abdominal cramps.  Regular contractions that start at 10 minutes apart and become stronger and more frequent with time.  Contractions that start on the top of the uterus and spread down to the lower abdomen and back.  Increased pelvic pressure and dull back pain.  A watery or bloody mucus discharge that comes from the vagina.  Leaking of amniotic fluid. This is also known as your "water breaking." It could be a slow trickle or a gush. Let your health care provider know if it has a color or strange odor. If you have any of these signs, call your health care provider right away, even if it is before your due date. Follow these instructions at home: Medicines  Follow your health care provider's instructions regarding medicine use. Specific medicines may be either safe or unsafe to take during pregnancy.  Take a prenatal vitamin that contains at least 600 micrograms (mcg) of folic acid.  If you develop constipation, try taking a stool softener if your health care provider approves. Eating and drinking   Eat a balanced diet that includes fresh fruits and vegetables, whole grains, good sources of protein such as meat, eggs, or tofu,  and low-fat dairy. Your health care provider will help you determine the amount of weight gain that is right for you.  Avoid raw meat and uncooked cheese. These carry germs that can cause birth defects in the baby.  If you have low calcium intake from food, talk to your health care provider about whether you should take a daily calcium supplement.  Eat four or five small meals rather than three large meals a day.  Limit foods that are high in fat and processed sugars, such as fried and sweet foods.  To prevent constipation: ? Drink enough fluid to keep your urine clear or pale yellow. ? Eat foods that are high in fiber, such as fresh fruits and vegetables, whole grains, and beans. Activity  Exercise only as directed by your health care provider. Most women can continue their usual exercise routine during pregnancy. Try to exercise for 30 minutes at least 5 days a week. Stop exercising if you experience uterine contractions.  Avoid heavy lifting.  Do   not exercise in extreme heat or humidity, or at high altitudes.  Wear low-heel, comfortable shoes.  Practice good posture.  You may continue to have sex unless your health care provider tells you otherwise. Relieving pain and discomfort  Take frequent breaks and rest with your legs elevated if you have leg cramps or low back pain.  Take warm sitz baths to soothe any pain or discomfort caused by hemorrhoids. Use hemorrhoid cream if your health care provider approves.  Wear a good support bra to prevent discomfort from breast tenderness.  If you develop varicose veins: ? Wear support pantyhose or compression stockings as told by your healthcare provider. ? Elevate your feet for 15 minutes, 3-4 times a day. Prenatal care  Write down your questions. Take them to your prenatal visits.  Keep all your prenatal visits as told by your health care provider. This is important. Safety  Wear your seat belt at all times when driving.  Make  a list of emergency phone numbers, including numbers for family, friends, the hospital, and police and fire departments. General instructions  Avoid cat litter boxes and soil used by cats. These carry germs that can cause birth defects in the baby. If you have a cat, ask someone to clean the litter box for you.  Do not travel far distances unless it is absolutely necessary and only with the approval of your health care provider.  Do not use hot tubs, steam rooms, or saunas.  Do not drink alcohol.  Do not use any products that contain nicotine or tobacco, such as cigarettes and e-cigarettes. If you need help quitting, ask your health care provider.  Do not use any medicinal herbs or unprescribed drugs. These chemicals affect the formation and growth of the baby.  Do not douche or use tampons or scented sanitary pads.  Do not cross your legs for long periods of time.  To prepare for the arrival of your baby: ? Take prenatal classes to understand, practice, and ask questions about labor and delivery. ? Make a trial run to the hospital. ? Visit the hospital and tour the maternity area. ? Arrange for maternity or paternity leave through employers. ? Arrange for family and friends to take care of pets while you are in the hospital. ? Purchase a rear-facing car seat and make sure you know how to install it in your car. ? Pack your hospital bag. ? Prepare the baby's nursery. Make sure to remove all pillows and stuffed animals from the baby's crib to prevent suffocation.  Visit your dentist if you have not gone during your pregnancy. Use a soft toothbrush to brush your teeth and be gentle when you floss. Contact a health care provider if:  You are unsure if you are in labor or if your water has broken.  You become dizzy.  You have mild pelvic cramps, pelvic pressure, or nagging pain in your abdominal area.  You have lower back pain.  You have persistent nausea, vomiting, or  diarrhea.  You have an unusual or bad smelling vaginal discharge.  You have pain when you urinate. Get help right away if:  Your water breaks before 37 weeks.  You have regular contractions less than 5 minutes apart before 37 weeks.  You have a fever.  You are leaking fluid from your vagina.  You have spotting or bleeding from your vagina.  You have severe abdominal pain or cramping.  You have rapid weight loss or weight gain.  You have   shortness of breath with chest pain.  You notice sudden or extreme swelling of your face, hands, ankles, feet, or legs.  Your baby makes fewer than 10 movements in 2 hours.  You have severe headaches that do not go away when you take medicine.  You have vision changes. Summary  The third trimester is from week 28 through week 40, months 7 through 9. The third trimester is a time when the unborn baby (fetus) is growing rapidly.  During the third trimester, your discomfort may increase as you and your baby continue to gain weight. You may have abdominal, leg, and back pain, sleeping problems, and an increased need to urinate.  During the third trimester your breasts will keep growing and they will continue to become tender. A yellow fluid (colostrum) may leak from your breasts. This is the first milk you are producing for your baby.  False labor is a condition in which you feel small, irregular tightenings of the muscles in the womb (contractions) that eventually go away. These are called Braxton Hicks contractions. Contractions may last for hours, days, or even weeks before true labor sets in.  Signs of labor can include: abdominal cramps; regular contractions that start at 10 minutes apart and become stronger and more frequent with time; watery or bloody mucus discharge that comes from the vagina; increased pelvic pressure and dull back pain; and leaking of amniotic fluid. This information is not intended to replace advice given to you by your  health care provider. Make sure you discuss any questions you have with your health care provider. Document Revised: 04/23/2018 Document Reviewed: 02/06/2016 Elsevier Patient Education  2020 [employer]  

## 2019-09-15 ENCOUNTER — Other Ambulatory Visit: Payer: Self-pay | Admitting: Advanced Practice Midwife

## 2019-09-15 ENCOUNTER — Telehealth: Payer: Self-pay | Admitting: *Deleted

## 2019-09-15 DIAGNOSIS — W19XXXA Unspecified fall, initial encounter: Secondary | ICD-10-CM

## 2019-09-15 DIAGNOSIS — O99891 Other specified diseases and conditions complicating pregnancy: Secondary | ICD-10-CM

## 2019-09-15 DIAGNOSIS — R102 Pelvic and perineal pain: Secondary | ICD-10-CM

## 2019-09-15 MED ORDER — CYCLOBENZAPRINE HCL 10 MG PO TABS: 5.0000 mg | ORAL_TABLET | Freq: Three times a day (TID) | ORAL | 0 refills | Status: DC | PRN

## 2019-09-15 NOTE — Telephone Encounter (Signed)
Pt called to office stating that she was to have a muscle relaxer and a referral for ?PT at her last visit. Pt states she has not got Rx or call about referral.  Advised message to be sent to provider.    Please send Rx and referral if approved.

## 2019-10-01 ENCOUNTER — Other Ambulatory Visit (HOSPITAL_COMMUNITY)
Admission: RE | Admit: 2019-10-01 | Discharge: 2019-10-01 | Disposition: A | Discharge status: Home / Self Care | Payer: Medicaid Other | Source: Ambulatory Visit | Attending: Family Medicine | Admitting: Family Medicine

## 2019-10-01 ENCOUNTER — Ambulatory Visit (INDEPENDENT_AMBULATORY_CARE_PROVIDER_SITE_OTHER): Payer: Medicaid Other | Admitting: Family Medicine

## 2019-10-01 ENCOUNTER — Other Ambulatory Visit: Payer: Self-pay

## 2019-10-01 VITALS — BP 116/76 | HR 98 | Wt 200.0 lb

## 2019-10-01 DIAGNOSIS — Z3480 Encounter for supervision of other normal pregnancy, unspecified trimester: Secondary | ICD-10-CM

## 2019-10-01 DIAGNOSIS — D509 Iron deficiency anemia, unspecified: Secondary | ICD-10-CM | POA: Insufficient documentation

## 2019-10-01 DIAGNOSIS — O99019 Anemia complicating pregnancy, unspecified trimester: Secondary | ICD-10-CM

## 2019-10-01 NOTE — Progress Notes (Signed)
   PRENATAL VISIT NOTE  Subjective:  Chelsea Wallace is a 29 y.o. G4P3003 at [redacted]w[redacted]d being seen today for ongoing prenatal care.  She is currently monitored for the following issues for this low-risk pregnancy and has BMI 27.0-27.9,adult; Supervision of other normal pregnancy, antepartum; Genetic carrier; and Iron deficiency anemia during pregnancy on their problem list.  Patient reports no complaints.  Contractions: Irritability. Vag. Bleeding: None.  Movement: Present. Denies leaking of fluid.   The following portions of the patient's history were reviewed and updated as appropriate: allergies, current medications, past family history, past medical history, past social history, past surgical history and problem list.   Objective:   Vitals:   10/01/19 1017  BP: 116/76  Pulse: 98  Weight: 200 lb (90.7 kg)    Fetal Status: Fetal Heart Rate (bpm): 137   Movement: Present     General:  Alert, oriented and cooperative. Patient is in no acute distress.  Skin: Skin is warm and dry. No rash noted.   Cardiovascular: Normal heart rate noted  Respiratory: Normal respiratory effort, no problems with respiration noted  Abdomen: Soft, gravid, appropriate for gestational age.  Pain/Pressure: Present     Pelvic: Cervical exam deferred      External genitalia normal appearing.  Extremities: Normal range of motion.  Edema: Trace  Mental Status: Normal mood and affect. Normal behavior. Normal judgment and thought content.       Assessment and Plan:  Pregnancy: G4P3003 at [redacted]w[redacted]d  Supervision of other normal pregnancy, antepartum Doing well without complaints Discussed contraceptive options today, will think about -     Strep Gp B NAA -     Cervicovaginal ancillary only( [place])  Iron deficiency anemia during pregnancy hgb 10.8 on 7/23 Recommended every other day ferrous sulfate/vitamin c   Preterm labor symptoms and general obstetric precautions including but not limited to vaginal  bleeding, contractions, leaking of fluid and fetal movement were reviewed in detail with the patient. Please refer to After Visit Summary for other counseling recommendations.   No follow-ups on file.  No future appointments.  Alric Seton, MD

## 2019-10-01 NOTE — Patient Instructions (Signed)
-  start ferrous sulfate 325 every other day, take with vitamin c (ascorbic acid) 250 mg daily, over the counter

## 2019-10-01 NOTE — Progress Notes (Signed)
ROB/GBS, reports no problems today. 

## 2019-10-03 LAB — STREP GP B NAA: Strep Gp B NAA: NEGATIVE

## 2019-10-04 LAB — CERVICOVAGINAL ANCILLARY ONLY
Chlamydia: NEGATIVE
Comment: NEGATIVE
Comment: NORMAL
Neisseria Gonorrhea: NEGATIVE

## 2019-10-08 ENCOUNTER — Encounter: Payer: Self-pay | Admitting: Obstetrics and Gynecology

## 2019-10-08 ENCOUNTER — Other Ambulatory Visit: Payer: Self-pay

## 2019-10-08 ENCOUNTER — Ambulatory Visit (INDEPENDENT_AMBULATORY_CARE_PROVIDER_SITE_OTHER): Payer: Medicaid Other | Admitting: Obstetrics and Gynecology

## 2019-10-08 VITALS — BP 110/73 | HR 96 | Wt 203.9 lb

## 2019-10-08 DIAGNOSIS — O99019 Anemia complicating pregnancy, unspecified trimester: Secondary | ICD-10-CM

## 2019-10-08 DIAGNOSIS — D509 Iron deficiency anemia, unspecified: Secondary | ICD-10-CM

## 2019-10-08 DIAGNOSIS — Z3480 Encounter for supervision of other normal pregnancy, unspecified trimester: Secondary | ICD-10-CM

## 2019-10-08 NOTE — Progress Notes (Signed)
Pt presents for ROB adamantly requests cx check today. Has increased pressure, denies ctx's every 3-5 mins.

## 2019-10-08 NOTE — Progress Notes (Signed)
   PRENATAL VISIT NOTE  Subjective:  Chelsea Wallace is a 29 y.o. G4P3003 at [redacted]w[redacted]d being seen today for ongoing prenatal care.  She is currently monitored for the following issues for this low-risk pregnancy and has BMI 27.0-27.9,adult; Supervision of other normal pregnancy, antepartum; Genetic carrier; and Iron deficiency anemia during pregnancy on their problem list.  Patient reports no complaints.  Contractions: Irritability. Vag. Bleeding: None.  Movement: Present. Denies leaking of fluid.   The following portions of the patient's history were reviewed and updated as appropriate: allergies, current medications, past family history, past medical history, past social history, past surgical history and problem list.   Objective:   Vitals:   10/08/19 0904  BP: 110/73  Pulse: 96  Weight: 203 lb 14.4 oz (92.5 kg)    Fetal Status: Fetal Heart Rate (bpm): 147 Fundal Height: 36 cm Movement: Present  Presentation: Vertex  General:  Alert, oriented and cooperative. Patient is in no acute distress.  Skin: Skin is warm and dry. No rash noted.   Cardiovascular: Normal heart rate noted  Respiratory: Normal respiratory effort, no problems with respiration noted  Abdomen: Soft, gravid, appropriate for gestational age.  Pain/Pressure: Present     Pelvic: Cervical exam performed in the presence of a chaperone Dilation: 1.5 Effacement (%): Thick Station: Ballotable  Extremities: Normal range of motion.  Edema: Trace  Mental Status: Normal mood and affect. Normal behavior. Normal judgment and thought content.   Assessment and Plan:  Pregnancy: G4P3003 at [redacted]w[redacted]d 1. Supervision of other normal pregnancy, antepartum Patient is doing well without complaints Undecided on contraception  2. Iron deficiency anemia during pregnancy Continue iron supplement  Term labor symptoms and general obstetric precautions including but not limited to vaginal bleeding, contractions, leaking of fluid and fetal  movement were reviewed in detail with the patient. Please refer to After Visit Summary for other counseling recommendations.   Return in about 1 week (around 10/15/2019) for in person, ROB, Low risk.  Future Appointments  Date Time Provider Department Center  10/15/2019 10:30 AM Warden Fillers, MD [facility] None  10/22/2019 11:10 AM Gerrit Heck, CNM [facility] None    Catalina Antigua, MD

## 2019-10-08 NOTE — Patient Instructions (Signed)
Contraception Choices Contraception, also called birth control, refers to methods or devices that prevent pregnancy. Hormonal methods Contraceptive implant  A contraceptive implant is a thin, plastic tube that contains a hormone. It is inserted into the upper part of the arm. It can remain in place for up to 3 years. Progestin-only injections Progestin-only injections are injections of progestin, a synthetic form of the hormone progesterone. They are given every 3 months by a health care provider. Birth control pills  Birth control pills are pills that contain hormones that prevent pregnancy. They must be taken once a day, preferably at the same time each day. Birth control patch  The birth control patch contains hormones that prevent pregnancy. It is placed on the skin and must be changed once a week for three weeks and removed on the fourth week. A prescription is needed to use this method of contraception. Vaginal ring  A vaginal ring contains hormones that prevent pregnancy. It is placed in the vagina for three weeks and removed on the fourth week. After that, the process is repeated with a new ring. A prescription is needed to use this method of contraception. Emergency contraceptive Emergency contraceptives prevent pregnancy after unprotected sex. They come in pill form and can be taken up to 5 days after sex. They work best the sooner they are taken after having sex. Most emergency contraceptives are available without a prescription. This method should not be used as your only form of birth control. Barrier methods Female condom  A female condom is a thin sheath that is worn over the penis during sex. Condoms keep sperm from going inside a woman's body. They can be used with a spermicide to increase their effectiveness. They should be disposed after a single use. Female condom  A female condom is a soft, loose-fitting sheath that is put into the vagina before sex. The condom keeps sperm  from going inside a woman's body. They should be disposed after a single use. Diaphragm  A diaphragm is a soft, dome-shaped barrier. It is inserted into the vagina before sex, along with a spermicide. The diaphragm blocks sperm from entering the uterus, and the spermicide kills sperm. A diaphragm should be left in the vagina for 6-8 hours after sex and removed within 24 hours. A diaphragm is prescribed and fitted by a health care provider. A diaphragm should be replaced every 1-2 years, after giving birth, after gaining more than 15 lb (6.8 kg), and after pelvic surgery. Cervical cap  A cervical cap is a round, soft latex or plastic cup that fits over the cervix. It is inserted into the vagina before sex, along with spermicide. It blocks sperm from entering the uterus. The cap should be left in place for 6-8 hours after sex and removed within 48 hours. A cervical cap must be prescribed and fitted by a health care provider. It should be replaced every 2 years. Sponge  A sponge is a soft, circular piece of polyurethane foam with spermicide on it. The sponge helps block sperm from entering the uterus, and the spermicide kills sperm. To use it, you make it wet and then insert it into the vagina. It should be inserted before sex, left in for at least 6 hours after sex, and removed and thrown away within 30 hours. Spermicides Spermicides are chemicals that kill or block sperm from entering the cervix and uterus. They can come as a cream, jelly, suppository, foam, or tablet. A spermicide should be inserted into the   vagina with an applicator at least 10-15 minutes before sex to allow time for it to work. The process must be repeated every time you have sex. Spermicides do not require a prescription. Intrauterine contraception Intrauterine device (IUD) An IUD is a T-shaped device that is put in a woman's uterus. There are two types:  Hormone IUD.This type contains progestin, a synthetic form of the hormone  progesterone. This type can stay in place for 3-5 years.  Copper IUD.This type is wrapped in copper wire. It can stay in place for 10 years.  Permanent methods of contraception Female tubal ligation In this method, a woman's fallopian tubes are sealed, tied, or blocked during surgery to prevent eggs from traveling to the uterus. Hysteroscopic sterilization In this method, a small, flexible insert is placed into each fallopian tube. The inserts cause scar tissue to form in the fallopian tubes and block them, so sperm cannot reach an egg. The procedure takes about 3 months to be effective. Another form of birth control must be used during those 3 months. Female sterilization This is a procedure to tie off the tubes that carry sperm (vasectomy). After the procedure, the man can still ejaculate fluid (semen). Natural planning methods Natural family planning In this method, a couple does not have sex on days when the woman could become pregnant. Calendar method This means keeping track of the length of each menstrual cycle, identifying the days when pregnancy can happen, and not having sex on those days. Ovulation method In this method, a couple avoids sex during ovulation. Symptothermal method This method involves not having sex during ovulation. The woman typically checks for ovulation by watching changes in her temperature and in the consistency of cervical mucus. Post-ovulation method In this method, a couple waits to have sex until after ovulation. Summary  Contraception, also called birth control, means methods or devices that prevent pregnancy.  Hormonal methods of contraception include implants, injections, pills, patches, vaginal rings, and emergency contraceptives.  Barrier methods of contraception can include female condoms, female condoms, diaphragms, cervical caps, sponges, and spermicides.  There are two types of IUDs (intrauterine devices). An IUD can be put in a woman's uterus to  prevent pregnancy for 3-5 years.  Permanent sterilization can be done through a procedure for males, females, or both.  Natural family planning methods involve not having sex on days when the woman could become pregnant. This information is not intended to replace advice given to you by your health care provider. Make sure you discuss any questions you have with your health care provider. Document Revised: 01/02/2017 Document Reviewed: 02/03/2016 Elsevier Patient Education  2020 [employer]  

## 2019-10-15 ENCOUNTER — Other Ambulatory Visit: Payer: Self-pay

## 2019-10-15 ENCOUNTER — Telehealth (HOSPITAL_COMMUNITY): Payer: Self-pay | Admitting: *Deleted

## 2019-10-15 ENCOUNTER — Encounter (HOSPITAL_COMMUNITY): Payer: Self-pay | Admitting: *Deleted

## 2019-10-15 ENCOUNTER — Other Ambulatory Visit: Payer: Self-pay | Admitting: Advanced Practice Midwife

## 2019-10-15 ENCOUNTER — Ambulatory Visit (INDEPENDENT_AMBULATORY_CARE_PROVIDER_SITE_OTHER): Payer: Medicaid Other | Admitting: Obstetrics and Gynecology

## 2019-10-15 VITALS — BP 112/72 | HR 90 | Wt 204.9 lb

## 2019-10-15 DIAGNOSIS — O99019 Anemia complicating pregnancy, unspecified trimester: Secondary | ICD-10-CM

## 2019-10-15 DIAGNOSIS — D509 Iron deficiency anemia, unspecified: Secondary | ICD-10-CM

## 2019-10-15 DIAGNOSIS — Z3A38 38 weeks gestation of pregnancy: Secondary | ICD-10-CM | POA: Insufficient documentation

## 2019-10-15 DIAGNOSIS — Z6827 Body mass index (BMI) 27.0-27.9, adult: Secondary | ICD-10-CM

## 2019-10-15 DIAGNOSIS — Z3480 Encounter for supervision of other normal pregnancy, unspecified trimester: Secondary | ICD-10-CM

## 2019-10-15 NOTE — Progress Notes (Signed)
   PRENATAL VISIT NOTE  Subjective:  Chelsea Wallace is a 29 y.o. G4P3003 at [redacted]w[redacted]d being seen today for ongoing prenatal care.  She is currently monitored for the following issues for this low-risk pregnancy and has BMI 27.0-27.9,adult; Supervision of other normal pregnancy, antepartum; Genetic carrier; and Iron deficiency anemia during pregnancy on their problem list.  Patient doing well with no acute concerns today. She reports occasional contractions.  Contractions: Irregular. Vag. Bleeding: None.  Movement: Present. Denies leaking of fluid. Pt notes ctx q 5 minutes, but appears relatively comfortable.  The following portions of the patient's history were reviewed and updated as appropriate: allergies, current medications, past family history, past medical history, past social history, past surgical history and problem list. Problem list updated.  Objective:   Vitals:   10/15/19 1042  BP: 112/72  Pulse: 90  Weight: 204 lb 14.4 oz (92.9 kg)    Fetal Status: Fetal Heart Rate (bpm): 148 Fundal Height: 38 cm Movement: Present     General:  Alert, oriented and cooperative. Patient is in no acute distress.  Skin: Skin is warm and dry. No rash noted.   Cardiovascular: Normal heart rate noted  Respiratory: Normal respiratory effort, no problems with respiration noted  Abdomen: Soft, gravid, appropriate for gestational age.  Pain/Pressure: Present     Pelvic: Cervical exam performed Dilation: 3 Effacement (%): 70 Station: -3, external 3, internal 1  Extremities: Normal range of motion.  Edema: Trace  Mental Status:  Normal mood and affect. Normal behavior. Normal judgment and thought content.   Assessment and Plan:  Pregnancy: G4P3003 at [redacted]w[redacted]d  1. BMI 27.0-27.9,adult   2. Supervision of other normal pregnancy, antepartum   3. Iron deficiency anemia during pregnancy   Term labor symptoms and general obstetric precautions including but not limited to vaginal bleeding, contractions,  leaking of fluid and fetal movement were reviewed in detail with the patient.  Please refer to After Visit Summary for other counseling recommendations.   Return in about 1 week (around 10/22/2019) for ROB, in person.   Mariel Aloe, MD

## 2019-10-15 NOTE — Telephone Encounter (Signed)
Preadmission screen  

## 2019-10-15 NOTE — Patient Instructions (Signed)

## 2019-10-15 NOTE — Progress Notes (Signed)
Pt is here for ROB, [redacted]w[redacted]d.  

## 2019-10-21 ENCOUNTER — Other Ambulatory Visit (HOSPITAL_COMMUNITY)
Admission: RE | Admit: 2019-10-21 | Discharge: 2019-10-21 | Disposition: A | Discharge status: Home / Self Care | Payer: Medicaid Other | Source: Ambulatory Visit | Attending: Family Medicine | Admitting: Family Medicine

## 2019-10-21 DIAGNOSIS — Z01818 Encounter for other preprocedural examination: Secondary | ICD-10-CM | POA: Diagnosis present

## 2019-10-21 DIAGNOSIS — Z20822 Contact with and (suspected) exposure to covid-19: Secondary | ICD-10-CM | POA: Insufficient documentation

## 2019-10-21 LAB — SARS CORONAVIRUS 2 (TAT 6-24 HRS): SARS Coronavirus 2: NEGATIVE

## 2019-10-22 ENCOUNTER — Ambulatory Visit (INDEPENDENT_AMBULATORY_CARE_PROVIDER_SITE_OTHER): Payer: Medicaid Other

## 2019-10-22 ENCOUNTER — Other Ambulatory Visit: Payer: Self-pay

## 2019-10-22 VITALS — BP 118/82 | HR 96 | Wt 206.4 lb

## 2019-10-22 DIAGNOSIS — Z3480 Encounter for supervision of other normal pregnancy, unspecified trimester: Secondary | ICD-10-CM

## 2019-10-22 DIAGNOSIS — Z3A39 39 weeks gestation of pregnancy: Secondary | ICD-10-CM

## 2019-10-22 NOTE — Progress Notes (Signed)
   LOW-RISK PREGNANCY OFFICE VISIT  Patient name: SANIAH SCHROETER MRN [id number]  Date of birth: Dec 07, 1990 Chief Complaint:   Routine Prenatal Visit  Subjective:   CAERA ENWRIGHT is a 29 y.o. G44P3003 female at [redacted]w[redacted]d with an Estimated Date of Delivery: 10/23/19 being seen today for ongoing management of a low-risk pregnancy aeb has BMI 27.0-27.9,adult; Supervision of other normal pregnancy, antepartum; Genetic carrier; Iron deficiency anemia during pregnancy; and [redacted] weeks gestation of pregnancy on their problem list.  Patient presents today with  no complaints. She states she was contracting every 6 minutes last night, but that has decreased today.  Patient denies vaginal concerns including abnormal discharge and bleeding.  She states she has had some leaking, but "it's been like that the entire time."  Contractions: Irregular. Vag. Bleeding: None.  Movement: Present.  Reviewed past medical,surgical, social, obstetrical and family history as well as problem list, medications and allergies.  Objective   Vitals:   10/22/19 1118  BP: 118/82  Pulse: 96  Weight: 206 lb 6.4 oz (93.6 kg)  Body mass index is 34.35 kg/m.  Total Weight Gain:36 lb 6.4 oz (16.5 kg)         Physical Examination:   General appearance: Well appearing, and in no distress  Mental status: Alert, oriented to person, place, and time  Skin: Warm & dry  Cardiovascular: Normal heart rate noted  Respiratory: Normal respiratory effort, no distress  Abdomen: Soft, gravid, nontender, AGA with Fundal height of Fundal Height: 40 cm  Pelvic: Cervical exam performed  Dilation: 3 Effacement (%): Thick Station: -3 Presentation: Vertex  Extremities: Edema: Trace  Fetal Status: Fetal Heart Rate (bpm): 146  Movement: Present   No results found for this or any previous visit (from the past 24 hour(s)).  Assessment & Plan:  Low-risk pregnancy of a 29 y.o., [id number] at [redacted]w[redacted]d with an Estimated Date of Delivery: 10/23/19   1.  Supervision of other normal pregnancy, antepartum -Patient states she has been considering the Nuvaring, but is unsure. -States she does not desire more children. -Information sheet given.   2. [redacted] weeks gestation of pregnancy -Doing well. -Scheduled for IOL tomorrow. -Briefly discussed IOL procedure.  -Encouraged to schedule for PPV in 4 weeks.    Meds: No orders of the defined types were placed in this encounter.  Labs/procedures today:  Lab Orders  No laboratory test(s) ordered today     Reviewed: Term labor symptoms and general obstetric precautions including but not limited to vaginal bleeding, contractions, leaking of fluid and fetal movement were reviewed in detail with the patient.  All questions were answered.  Follow-up: Return in about 4 weeks (around 11/19/2019) for Postpartum.  No orders of the defined types were placed in this encounter.  Cherre Robins MSN, CNM 10/22/2019

## 2019-10-22 NOTE — Patient Instructions (Signed)
Contraception Choices Contraception, also called birth control, refers to methods or devices that prevent pregnancy. Hormonal methods Contraceptive implant  A contraceptive implant is a thin, plastic tube that contains a hormone. It is inserted into the upper part of the arm. It can remain in place for up to 3 years. Progestin-only injections Progestin-only injections are injections of progestin, a synthetic form of the hormone progesterone. They are given every 3 months by a health care provider. Birth control pills  Birth control pills are pills that contain hormones that prevent pregnancy. They must be taken once a day, preferably at the same time each day. Birth control patch  The birth control patch contains hormones that prevent pregnancy. It is placed on the skin and must be changed once a week for three weeks and removed on the fourth week. A prescription is needed to use this method of contraception. Vaginal ring  A vaginal ring contains hormones that prevent pregnancy. It is placed in the vagina for three weeks and removed on the fourth week. After that, the process is repeated with a new ring. A prescription is needed to use this method of contraception. Emergency contraceptive Emergency contraceptives prevent pregnancy after unprotected sex. They come in pill form and can be taken up to 5 days after sex. They work best the sooner they are taken after having sex. Most emergency contraceptives are available without a prescription. This method should not be used as your only form of birth control. Barrier methods Female condom  A female condom is a thin sheath that is worn over the penis during sex. Condoms keep sperm from going inside a woman's body. They can be used with a spermicide to increase their effectiveness. They should be disposed after a single use. Female condom  A female condom is a soft, loose-fitting sheath that is put into the vagina before sex. The condom keeps sperm  from going inside a woman's body. They should be disposed after a single use. Diaphragm  A diaphragm is a soft, dome-shaped barrier. It is inserted into the vagina before sex, along with a spermicide. The diaphragm blocks sperm from entering the uterus, and the spermicide kills sperm. A diaphragm should be left in the vagina for 6-8 hours after sex and removed within 24 hours. A diaphragm is prescribed and fitted by a health care provider. A diaphragm should be replaced every 1-2 years, after giving birth, after gaining more than 15 lb (6.8 kg), and after pelvic surgery. Cervical cap  A cervical cap is a round, soft latex or plastic cup that fits over the cervix. It is inserted into the vagina before sex, along with spermicide. It blocks sperm from entering the uterus. The cap should be left in place for 6-8 hours after sex and removed within 48 hours. A cervical cap must be prescribed and fitted by a health care provider. It should be replaced every 2 years. Sponge  A sponge is a soft, circular piece of polyurethane foam with spermicide on it. The sponge helps block sperm from entering the uterus, and the spermicide kills sperm. To use it, you make it wet and then insert it into the vagina. It should be inserted before sex, left in for at least 6 hours after sex, and removed and thrown away within 30 hours. Spermicides Spermicides are chemicals that kill or block sperm from entering the cervix and uterus. They can come as a cream, jelly, suppository, foam, or tablet. A spermicide should be inserted into the   vagina with an applicator at least 10-15 minutes before sex to allow time for it to work. The process must be repeated every time you have sex. Spermicides do not require a prescription. Intrauterine contraception Intrauterine device (IUD) An IUD is a T-shaped device that is put in a woman's uterus. There are two types:  Hormone IUD.This type contains progestin, a synthetic form of the hormone  progesterone. This type can stay in place for 3-5 years.  Copper IUD.This type is wrapped in copper wire. It can stay in place for 10 years.  Permanent methods of contraception Female tubal ligation In this method, a woman's fallopian tubes are sealed, tied, or blocked during surgery to prevent eggs from traveling to the uterus. Hysteroscopic sterilization In this method, a small, flexible insert is placed into each fallopian tube. The inserts cause scar tissue to form in the fallopian tubes and block them, so sperm cannot reach an egg. The procedure takes about 3 months to be effective. Another form of birth control must be used during those 3 months. Female sterilization This is a procedure to tie off the tubes that carry sperm (vasectomy). After the procedure, the man can still ejaculate fluid (semen). Natural planning methods Natural family planning In this method, a couple does not have sex on days when the woman could become pregnant. Calendar method This means keeping track of the length of each menstrual cycle, identifying the days when pregnancy can happen, and not having sex on those days. Ovulation method In this method, a couple avoids sex during ovulation. Symptothermal method This method involves not having sex during ovulation. The woman typically checks for ovulation by watching changes in her temperature and in the consistency of cervical mucus. Post-ovulation method In this method, a couple waits to have sex until after ovulation. Summary  Contraception, also called birth control, means methods or devices that prevent pregnancy.  Hormonal methods of contraception include implants, injections, pills, patches, vaginal rings, and emergency contraceptives.  Barrier methods of contraception can include female condoms, female condoms, diaphragms, cervical caps, sponges, and spermicides.  There are two types of IUDs (intrauterine devices). An IUD can be put in a woman's uterus to  prevent pregnancy for 3-5 years.  Permanent sterilization can be done through a procedure for males, females, or both.  Natural family planning methods involve not having sex on days when the woman could become pregnant. This information is not intended to replace advice given to you by your health care provider. Make sure you discuss any questions you have with your health care provider. Document Revised: 01/02/2017 Document Reviewed: 02/03/2016 Elsevier Patient Education  2020 [employer]  

## 2019-10-23 ENCOUNTER — Inpatient Hospital Stay (HOSPITAL_COMMUNITY): Payer: Medicaid Other

## 2019-10-24 ENCOUNTER — Other Ambulatory Visit: Payer: Self-pay | Admitting: Family Medicine

## 2019-10-24 ENCOUNTER — Encounter (HOSPITAL_COMMUNITY): Payer: Self-pay | Admitting: Obstetrics and Gynecology

## 2019-10-24 ENCOUNTER — Inpatient Hospital Stay (HOSPITAL_COMMUNITY)
Admission: AD | Admit: 2019-10-24 | Discharge: 2019-10-26 | DRG: 807 | Disposition: A | Discharge status: Home / Self Care | Payer: Medicaid Other | Attending: Obstetrics & Gynecology | Admitting: Obstetrics & Gynecology

## 2019-10-24 ENCOUNTER — Inpatient Hospital Stay (HOSPITAL_COMMUNITY): Payer: Medicaid Other

## 2019-10-24 ENCOUNTER — Other Ambulatory Visit: Payer: Self-pay

## 2019-10-24 DIAGNOSIS — Z23 Encounter for immunization: Secondary | ICD-10-CM

## 2019-10-24 DIAGNOSIS — Z30430 Encounter for insertion of intrauterine contraceptive device: Secondary | ICD-10-CM

## 2019-10-24 DIAGNOSIS — O9902 Anemia complicating childbirth: Secondary | ICD-10-CM | POA: Diagnosis present

## 2019-10-24 DIAGNOSIS — Z148 Genetic carrier of other disease: Secondary | ICD-10-CM

## 2019-10-24 DIAGNOSIS — O26893 Other specified pregnancy related conditions, third trimester: Secondary | ICD-10-CM | POA: Diagnosis present

## 2019-10-24 DIAGNOSIS — D509 Iron deficiency anemia, unspecified: Secondary | ICD-10-CM | POA: Diagnosis present

## 2019-10-24 DIAGNOSIS — Z3480 Encounter for supervision of other normal pregnancy, unspecified trimester: Secondary | ICD-10-CM

## 2019-10-24 DIAGNOSIS — O99019 Anemia complicating pregnancy, unspecified trimester: Secondary | ICD-10-CM | POA: Diagnosis present

## 2019-10-24 DIAGNOSIS — Z3A40 40 weeks gestation of pregnancy: Secondary | ICD-10-CM

## 2019-10-24 LAB — TYPE AND SCREEN
ABO/RH(D): O POS
Antibody Screen: NEGATIVE

## 2019-10-24 LAB — CBC
HCT: 34.6 % — ABNORMAL LOW (ref 36.0–46.0)
Hemoglobin: 10.9 g/dL — ABNORMAL LOW (ref 12.0–15.0)
MCH: 28.2 pg (ref 26.0–34.0)
MCHC: 31.5 g/dL (ref 30.0–36.0)
MCV: 89.6 fL (ref 80.0–100.0)
Platelets: 181 10*3/uL (ref 150–400)
RBC: 3.86 MIL/uL — ABNORMAL LOW (ref 3.87–5.11)
RDW: 14.6 % (ref 11.5–15.5)
WBC: 6.9 10*3/uL (ref 4.0–10.5)
nRBC: 0 % (ref 0.0–0.2)

## 2019-10-24 MED ORDER — OXYTOCIN-SODIUM CHLORIDE 30-0.9 UT/500ML-% IV SOLN
1.0000 m[IU]/min | INTRAVENOUS | Status: DC
  Administered 2019-10-25: 2 m[IU]/min via INTRAVENOUS
  Filled 2019-10-24: qty 500

## 2019-10-24 MED ORDER — TERBUTALINE SULFATE 1 MG/ML IJ SOLN
0.2500 mg | Freq: Once | INTRAMUSCULAR | Status: AC | PRN
  Administered 2019-10-25: 0.25 mg via SUBCUTANEOUS
  Filled 2019-10-24: qty 1

## 2019-10-24 MED ORDER — PHENYLEPHRINE 40 MCG/ML (10ML) SYRINGE FOR IV PUSH (FOR BLOOD PRESSURE SUPPORT)
80.0000 ug | PREFILLED_SYRINGE | INTRAVENOUS | Status: DC | PRN
  Administered 2019-10-25: 80 ug via INTRAVENOUS

## 2019-10-24 MED ORDER — EPHEDRINE 5 MG/ML INJ
10.0000 mg | INTRAVENOUS | Status: DC | PRN
  Administered 2019-10-25: 10 mg via INTRAVENOUS

## 2019-10-24 MED ORDER — SODIUM CHLORIDE 0.9 % IV SOLN: 5.0000 10*6.[IU] | Freq: Once | INTRAVENOUS | Status: DC

## 2019-10-24 MED ORDER — FENTANYL-BUPIVACAINE-NACL 0.5-0.125-0.9 MG/250ML-% EP SOLN
12.0000 mL/h | EPIDURAL | Status: DC | PRN
  Filled 2019-10-24: qty 250

## 2019-10-24 MED ORDER — ONDANSETRON HCL 4 MG/2ML IJ SOLN: 4.0000 mg | Freq: Four times a day (QID) | INTRAMUSCULAR | Status: DC | PRN

## 2019-10-24 MED ORDER — PENICILLIN G POT IN DEXTROSE 60000 UNIT/ML IV SOLN: 3.0000 10*6.[IU] | INTRAVENOUS | Status: DC

## 2019-10-24 MED ORDER — LACTATED RINGERS IV SOLN
500.0000 mL | Freq: Once | INTRAVENOUS | Status: AC
  Administered 2019-10-25: 500 mL via INTRAVENOUS

## 2019-10-24 MED ORDER — EPHEDRINE 5 MG/ML INJ
10.0000 mg | INTRAVENOUS | Status: DC | PRN
  Filled 2019-10-24: qty 10

## 2019-10-24 MED ORDER — LACTATED RINGERS IV SOLN: INTRAVENOUS | Status: DC

## 2019-10-24 MED ORDER — MISOPROSTOL 50MCG HALF TABLET
50.0000 ug | ORAL_TABLET | Freq: Once | ORAL | Status: AC
  Administered 2019-10-24: 50 ug via BUCCAL
  Filled 2019-10-24: qty 1

## 2019-10-24 MED ORDER — HYDROXYZINE HCL 50 MG PO TABS: 50.0000 mg | ORAL_TABLET | Freq: Four times a day (QID) | ORAL | Status: DC | PRN

## 2019-10-24 MED ORDER — ACETAMINOPHEN 325 MG PO TABS: 650.0000 mg | ORAL_TABLET | ORAL | Status: DC | PRN

## 2019-10-24 MED ORDER — SOD CITRATE-CITRIC ACID 500-334 MG/5ML PO SOLN: 30.0000 mL | ORAL | Status: DC | PRN

## 2019-10-24 MED ORDER — FENTANYL CITRATE (PF) 100 MCG/2ML IJ SOLN
50.0000 ug | INTRAMUSCULAR | Status: DC | PRN
  Administered 2019-10-25: 100 ug via INTRAVENOUS
  Filled 2019-10-24: qty 2

## 2019-10-24 MED ORDER — LACTATED RINGERS IV SOLN
500.0000 mL | INTRAVENOUS | Status: DC | PRN
  Administered 2019-10-24: 1000 mL via INTRAVENOUS

## 2019-10-24 MED ORDER — OXYTOCIN BOLUS FROM INFUSION
333.0000 mL | Freq: Once | INTRAVENOUS | Status: AC
  Administered 2019-10-25: 333 mL via INTRAVENOUS

## 2019-10-24 MED ORDER — PHENYLEPHRINE 40 MCG/ML (10ML) SYRINGE FOR IV PUSH (FOR BLOOD PRESSURE SUPPORT)
80.0000 ug | PREFILLED_SYRINGE | INTRAVENOUS | Status: DC | PRN
  Filled 2019-10-24: qty 10

## 2019-10-24 MED ORDER — DIPHENHYDRAMINE HCL 50 MG/ML IJ SOLN: 12.5000 mg | INTRAMUSCULAR | Status: DC | PRN

## 2019-10-24 MED ORDER — MISOPROSTOL 25 MCG QUARTER TABLET: 25.0000 ug | ORAL_TABLET | ORAL | Status: DC | PRN

## 2019-10-24 MED ORDER — OXYCODONE-ACETAMINOPHEN 5-325 MG PO TABS: 1.0000 | ORAL_TABLET | ORAL | Status: DC | PRN

## 2019-10-24 MED ORDER — OXYTOCIN-SODIUM CHLORIDE 30-0.9 UT/500ML-% IV SOLN
2.5000 [IU]/h | INTRAVENOUS | Status: DC
  Administered 2019-10-25: 2.5 [IU]/h via INTRAVENOUS

## 2019-10-24 MED ORDER — LIDOCAINE HCL (PF) 1 % IJ SOLN: 30.0000 mL | INTRAMUSCULAR | Status: DC | PRN

## 2019-10-24 NOTE — H&P (Addendum)
Obstetric Admission History & Physical  Subjective Chelsea Wallace is a 29 y.o. female [id number] with IUP at [redacted]w[redacted]d by early ultrasound admitted for eIOL.  Reports fetal movement. Denies vaginal bleeding and ROM. She received her prenatal care at [facility].  Support person in labor: FOB   Ultrasounds 18+3, EFW 47%, anatomy - not completed  22+6, EFW 63% anatomy complete  Prenatal History/Complications: SMA 2 copies   Past Medical History:  Diagnosis Date  . GERD (gastroesophageal reflux disease)    on prescription for it; pt cannot remember name of med.   Past Surgical History:  Procedure Laterality Date  . MANDIBLE RECONSTRUCTION     lower jaw surgery 2011  . MANDIBLE SURGERY Bilateral    age 64 or 28, manibles fx to adjust bite   OB History    Gravida  4   Para  3   Term  3   Preterm  0   AB  0   Living  3     SAB  0   TAB  0   Ectopic  0   Multiple  0   Live Births  3          Social Hx   reports that she has never smoked. She has never used smokeless tobacco. She reports that she does not drink alcohol and does not use drugs.  Family Hx  family history includes Diabetes in her maternal grandfather and mother; Hypertension in her paternal grandfather and paternal grandmother.  Allergies: No Known Allergies Medications:  No outpatient medications have been marked as taking for the 10/24/19 encounter [facility] Encounter).   Review of Systems  All systems reviewed and negative except as stated in HPI  Objective Physical Exam:  BP 125/85   Pulse (!) 106   Temp 98.4 F (36.9 C) (Oral)   Resp 18   Ht 5\' 5"  (1.651 m)   Wt 92.5 kg   LMP 01/11/2019 (Approximate)   BMI 33.95 kg/m  General appearance: alert, cooperative and appears stated age Lungs: no respiratory distress Heart: RRR. No BLEE.  Abdomen: soft, non-tender; gravid  Extremities: Moving spontaneously, warm, well perfused. 2+ DP.  Uterine activity: no contraction pain Cervical Exam:  Not yet checked  Presentation: unsure - will check with cervical exam or 01/13/2019 Fetal monitoring: baseline 140 / mod variability/ +a / none d  Prenatal labs: ABO, Rh: O/Positive/-- (03/26 1110) Antibody: Negative (03/26 1110) Rubella: 5.85 (03/26 1110) RPR: Non Reactive (07/23 1055)  HBsAg: Negative (03/26 1110)  HIV: Non Reactive (07/23 1055)  GBS: Negative/-- (09/17 1126)  Glucola: 146/120--passed Genetic screening:  LR NIPS, SMA 2 copies   Prenatal Transfer Tool  Maternal Diabetes: No Genetic Screening: Abnormal:  Results: Other:SMA 2 copies Maternal Ultrasounds/Referrals: Normal Fetal Ultrasounds or other Referrals:  None Maternal Substance Abuse:  No Significant Maternal Medications:  None Significant Maternal Lab Results: Group B Strep negative  No results found for this or any previous visit (from the past 24 hour(s)).  Patient Active Problem List   Diagnosis Date Noted  . [redacted] weeks gestation of pregnancy 10/24/2019  . Iron deficiency anemia during pregnancy 10/01/2019  . 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 Supervision of other normal pregnancy, antepartum 04/09/2019  . BMI 27.0-27.9,adult 03/11/2018    Assessment & Plan:  Chelsea STOFER is a 29 y.o. G4P3003 at 106w1d admitted for eIOL   Labor:  Discussed IOL process with patient and she expresses understanding. Given cervical exam and thick cervix will start  induction with cytotec buccal, suspect likely able to initiate pitocin in 4 hrs. Pain control: per pt preference Anticipated MOD: NSVD PPH Risk: low.  Fetal Wellbeing: Category I GBS Negative/-- (09/17 1126)  Continuous fetal monitoring  Postpartum Planning Girl/both/unsure (wanted Nuvaring- counseled) Rubella immune, Tdap declined  Genia Hotter, M.D.  Family Medicine  PGY-3 10/24/2019 7:39 PM  GME ATTESTATION:  I saw and evaluated the patient. I agree with the findings and the plan of care as documented in the resident's note.  Alric Seton,  MD OB Fellow, Faculty [facility], Center for [facility] 10/24/2019 7:39 PM

## 2019-10-25 ENCOUNTER — Encounter (HOSPITAL_COMMUNITY): Payer: Self-pay | Admitting: Obstetrics and Gynecology

## 2019-10-25 ENCOUNTER — Inpatient Hospital Stay (HOSPITAL_COMMUNITY): Payer: Medicaid Other | Admitting: Anesthesiology

## 2019-10-25 DIAGNOSIS — Z3A40 40 weeks gestation of pregnancy: Secondary | ICD-10-CM

## 2019-10-25 DIAGNOSIS — Z30430 Encounter for insertion of intrauterine contraceptive device: Secondary | ICD-10-CM | POA: Diagnosis not present

## 2019-10-25 LAB — RPR: RPR Ser Ql: NONREACTIVE

## 2019-10-25 MED ORDER — SIMETHICONE 80 MG PO CHEW: 80.0000 mg | CHEWABLE_TABLET | ORAL | Status: DC | PRN

## 2019-10-25 MED ORDER — LIDOCAINE HCL (PF) 1 % IJ SOLN
INTRAMUSCULAR | Status: DC | PRN
  Administered 2019-10-25: 4 mL via EPIDURAL
  Administered 2019-10-25: 8 mL via EPIDURAL

## 2019-10-25 MED ORDER — WITCH HAZEL-GLYCERIN EX PADS: 1.0000 "application " | MEDICATED_PAD | CUTANEOUS | Status: DC | PRN

## 2019-10-25 MED ORDER — IBUPROFEN 600 MG PO TABS
600.0000 mg | ORAL_TABLET | Freq: Four times a day (QID) | ORAL | Status: DC
  Administered 2019-10-25 – 2019-10-26 (×5): 600 mg via ORAL
  Filled 2019-10-25 (×5): qty 1

## 2019-10-25 MED ORDER — TERBUTALINE SULFATE 1 MG/ML IJ SOLN
INTRAMUSCULAR | Status: AC
  Filled 2019-10-25: qty 1

## 2019-10-25 MED ORDER — TETANUS-DIPHTH-ACELL PERTUSSIS 5-2.5-18.5 LF-MCG/0.5 IM SUSP
0.5000 mL | Freq: Once | INTRAMUSCULAR | Status: AC
  Administered 2019-10-26: 0.5 mL via INTRAMUSCULAR
  Filled 2019-10-25: qty 0.5

## 2019-10-25 MED ORDER — LACTATED RINGERS AMNIOINFUSION: INTRAVENOUS | Status: DC

## 2019-10-25 MED ORDER — PRENATAL MULTIVITAMIN CH
1.0000 | ORAL_TABLET | Freq: Every day | ORAL | Status: DC
  Administered 2019-10-25 – 2019-10-26 (×2): 1 via ORAL
  Filled 2019-10-25 (×2): qty 1

## 2019-10-25 MED ORDER — COCONUT OIL OIL
1.0000 "application " | TOPICAL_OIL | Status: DC | PRN
  Administered 2019-10-26: 1 via TOPICAL

## 2019-10-25 MED ORDER — EPINEPHRINE 1 MG/10ML IJ SOSY
PREFILLED_SYRINGE | INTRAMUSCULAR | Status: AC
  Filled 2019-10-25: qty 10

## 2019-10-25 MED ORDER — INFLUENZA VAC SPLIT QUAD 0.5 ML IM SUSY
0.5000 mL | PREFILLED_SYRINGE | INTRAMUSCULAR | Status: AC
  Administered 2019-10-26: 0.5 mL via INTRAMUSCULAR
  Filled 2019-10-25: qty 0.5

## 2019-10-25 MED ORDER — ONDANSETRON HCL 4 MG PO TABS: 4.0000 mg | ORAL_TABLET | ORAL | Status: DC | PRN

## 2019-10-25 MED ORDER — ACETAMINOPHEN 325 MG PO TABS
650.0000 mg | ORAL_TABLET | Freq: Four times a day (QID) | ORAL | Status: DC
  Administered 2019-10-25 – 2019-10-26 (×5): 650 mg via ORAL
  Filled 2019-10-25 (×5): qty 2

## 2019-10-25 MED ORDER — DIBUCAINE (PERIANAL) 1 % EX OINT: 1.0000 "application " | TOPICAL_OINTMENT | CUTANEOUS | Status: DC | PRN

## 2019-10-25 MED ORDER — SENNOSIDES-DOCUSATE SODIUM 8.6-50 MG PO TABS
2.0000 | ORAL_TABLET | ORAL | Status: DC
  Administered 2019-10-25: 2 via ORAL
  Filled 2019-10-25: qty 2

## 2019-10-25 MED ORDER — DIPHENHYDRAMINE HCL 25 MG PO CAPS: 25.0000 mg | ORAL_CAPSULE | Freq: Four times a day (QID) | ORAL | Status: DC | PRN

## 2019-10-25 MED ORDER — ONDANSETRON HCL 4 MG/2ML IJ SOLN: 4.0000 mg | INTRAMUSCULAR | Status: DC | PRN

## 2019-10-25 MED ORDER — BENZOCAINE-MENTHOL 20-0.5 % EX AERO: 1.0000 "application " | INHALATION_SPRAY | CUTANEOUS | Status: DC | PRN

## 2019-10-25 MED ORDER — LEVONORGESTREL 19.5 MCG/DAY IU IUD
INTRAUTERINE_SYSTEM | Freq: Once | INTRAUTERINE | Status: AC
  Administered 2019-10-25: 1 via INTRAUTERINE
  Filled 2019-10-25: qty 1

## 2019-10-25 MED ORDER — SODIUM CHLORIDE (PF) 0.9 % IJ SOLN
INTRAMUSCULAR | Status: DC | PRN
Start: 2019-10-25 — End: 2019-10-26
  Administered 2019-10-25: 12 mL/h via EPIDURAL

## 2019-10-25 NOTE — Anesthesia Procedure Notes (Signed)
Epidural Patient location during procedure: OB Start time: 10/25/2019 2:52 AM End time: 10/25/2019 2:54 AM  Staffing Anesthesiologist: Leilani Able, MD Performed: anesthesiologist   Preanesthetic Checklist Completed: patient identified, IV checked, site marked, risks and benefits discussed, surgical consent, monitors and equipment checked, pre-op evaluation and timeout performed  Epidural Patient position: sitting Prep: DuraPrep and site prepped and draped Patient monitoring: continuous pulse ox and blood pressure Approach: midline Location: L3-L4 Injection technique: LOR air  Needle:  Needle type: Tuohy  Needle gauge: 17 G Needle length: 9 cm and 9 Needle insertion depth: 6 cm Catheter type: closed end flexible Catheter size: 19 Gauge Catheter at skin depth: 12 cm Test dose: negative and Other  Assessment Events: blood not aspirated, injection not painful, no injection resistance, no paresthesia and negative IV test  Additional Notes Reason for block:procedure for pain

## 2019-10-25 NOTE — Progress Notes (Signed)
Labor Progress Note Chelsea Wallace is a 29 y.o. G4P3003 at [redacted]w[redacted]d presented for eIOL.   S: Doing well, reports no feelings of contractions. No epidural yet. No pain, no complaints. Simply asks for water.   O:  BP 119/69   Pulse 84   Temp 99.3 F (37.4 C) (Oral)   Resp 18   Ht 5\' 5"  (1.651 m)   Wt 92.5 kg   LMP 01/11/2019 (Approximate)   BMI 33.95 kg/m  EFM: baseline 125 bpm / moderate varaibility / accels present, no decels Toco: irregular  CVE: Dilation: 3.5 Effacement (%): 50 Cervical Position: Middle Station: -2 Presentation: Vertex Exam by:: [email or web address], rnc    A&P: 29 y.o. 30 [redacted]w[redacted]d presented for eIOL.  #Labor: Currently on pitocin break from 0030-0430 for increasingly irregular contractions with fetal variability going from moderate to minimal, now resolving with pit break #Pain: IV fentanyl q1 prn #FWB: Cat 1 #GBS negative   05-23-1979, MD 1:41 AM

## 2019-10-25 NOTE — Lactation Note (Signed)
This note was copied from a baby's chart. Lactation Consultation Note  Patient Name: Chelsea Wallace ZHYQM'V Date: 10/25/2019 Reason for consult: Initial assessment;Mother's request;Difficult latch;Term  Infant is 40 weeks 10 hours old.   Mom has previous experience breastfeeding other children for 2, 3 and 6 months. With this infant, she states unable to sustain the latch for considerable amount of time. Last three feedings were at 12 minutes and two at 5 minutes respectively. Infant has voided and passed 1 stool since birth.   LC applied heat and breast massage. LC able to express drops of colostrum which was spoon fed to the infant. Infant than placed STS with Mom to work on getting a deeper sustained latch.  Mom nipples are erect and large. Infant struggling to open jaw wide. Nipple shield used to help with getting a wider gape. Nipples are soft but firm.  Infant able to latch with a 20 NS on the left side once primed with Similac 20 cal/0z  0.90ml x2. Infant latched for 15 minutes. We switched her to opposite breast and infant able to latch without the nipple shield directly to the breast and nursed for 10 minutes. Signs of milk transfer noted with stimulation. Suck training and chin tug used to help open up to get a wider/deeper latch at the breast.   Plan 1. Mom to nurse based on cues 8-12x in 24 hour period, no more than 4 hours without an attempt.            2. Mom to offer infant both breasts and look for signs of milk transfer.  If she is unable to latch, Mom can use the NS 20 to assist with the latch.            3. Mom understands the use of the NS is a barrier to let down. She has a Medela pump at home and we reviewed parts, assembly and cleaning. Mom knows to use the pump q 3 hours for 15 minutes to increase stimulation if she is unable to latch at the breast.           4 LC brochure of inpatient and outpatient services reviewed with Mom.

## 2019-10-25 NOTE — Anesthesia Preprocedure Evaluation (Signed)
Anesthesia Evaluation  Patient identified by MRN, date of birth, ID band Patient awake    Reviewed: Allergy & Precautions, H&P , NPO status , Patient's Chart, lab work & pertinent test results  Airway Mallampati: I       Dental no notable dental hx.    Pulmonary neg pulmonary ROS,    Pulmonary exam normal        Cardiovascular negative cardio ROS Normal cardiovascular exam     Neuro/Psych negative neurological ROS  negative psych ROS   GI/Hepatic Neg liver ROS,   Endo/Other  negative endocrine ROS  Renal/GU negative Renal ROS  negative genitourinary   Musculoskeletal negative musculoskeletal ROS (+)   Abdominal (+) + obese,   Peds  Hematology  (+) anemia ,   Anesthesia Other Findings   Reproductive/Obstetrics (+) Pregnancy                             Anesthesia Physical  Anesthesia Plan  ASA: II  Anesthesia Plan: Epidural   Post-op Pain Management:    Induction:   PONV Risk Score and Plan:   Airway Management Planned:   Additional Equipment:   Intra-op Plan:   Post-operative Plan:   Informed Consent: I have reviewed the patients History and Physical, chart, labs and discussed the procedure including the risks, benefits and alternatives for the proposed anesthesia with the patient or authorized representative who has indicated his/her understanding and acceptance.       Plan Discussed with:   Anesthesia Plan Comments:         Anesthesia Quick Evaluation

## 2019-10-25 NOTE — Progress Notes (Signed)
Labor Progress Note Chelsea Wallace is a 29 y.o. G4P3003 at [redacted]w[redacted]d presented for eIOL.   S: Received epidural 0400, reporting good response. Still feeling pressure with contractions, but no urge to push.   O:  BP 126/82   Pulse 97   Temp 98 F (36.7 C) (Oral)   Resp 18   Ht 5\' 5"  (1.651 m)   Wt 92.5 kg   LMP 01/11/2019 (Approximate)   BMI 33.95 kg/m  EFM: baseline 125 bpm / moderate varaibility / accels present, recurrent late decels Toco: every 2-4 minutes  CVE: Dilation: 7.5 Effacement (%): 90 Cervical Position: Middle Station: -2 Presentation: Vertex Exam by:: Dr. [email or web address]   A&P: 29 y.o. 30 [redacted]w[redacted]d presented for eIOL.  #Labor: Recurrent late decels and soft maternal BP s/p terb/IUPC/FSE @ 0600, amnioinfusion started 0645  #Pain: epidural #FWB: Cat 2 for recurrent late decels  #GBS negative   0646, MD 6:42 AM

## 2019-10-25 NOTE — Anesthesia Postprocedure Evaluation (Signed)
Anesthesia Post Note  Patient: Chelsea Wallace  Procedure(s) Performed: AN AD HOC LABOR EPIDURAL     Patient location during evaluation: Mother Baby Anesthesia Type: Epidural Level of consciousness: awake and alert Pain management: pain level controlled Vital Signs Assessment: post-procedure vital signs reviewed and stable Respiratory status: spontaneous breathing, nonlabored ventilation and respiratory function stable Cardiovascular status: stable Postop Assessment: no headache, no backache and epidural receding Anesthetic complications: no   No complications documented.  Last Vitals:  Vitals:   10/25/19 0955 10/25/19 1101  BP: 112/64 118/65  Pulse: 85   Resp: 17 18  Temp: 36.7 C 36.7 C  SpO2: 99% 99%    Last Pain:  Vitals:   10/25/19 1103  TempSrc:   PainSc: 6    Pain Goal:                   [name]

## 2019-10-25 NOTE — Progress Notes (Addendum)
Labor Progress Note Chelsea Wallace is a 29 y.o. G4P3003 at [redacted]w[redacted]d presented for eIOL.   S: Doing well, reports moderate pain with contractions. No epidural yet.   O:  BP 119/75   Pulse 83   Temp 99.3 F (37.4 C) (Oral)   Resp 18   Ht 5\' 5"  (1.651 m)   Wt 92.5 kg   LMP 01/11/2019 (Approximate)   BMI 33.95 kg/m  EFM: baseline 125 bpm / moderate varaibility / accels present, no decels  CVE: Dilation: 3.5 Effacement (%): 50 Cervical Position: Middle Station: -2 Presentation: Vertex Exam by:: [email or web address], rnc    A&P: 29 y.o. 30 [redacted]w[redacted]d presented for eIOL.  #Labor: Progressing. #Pain: IV fentanyl q1 prn #FWB: Cat 1 #GBS negative   [redacted]w[redacted]d, MD 12:34 AM

## 2019-10-25 NOTE — Progress Notes (Signed)
Faculty Practice OB/GYN Attending Note  Patient with some prolonged and repetitive FHR decelerations. Multiple positions tried, finally improved on hands-and-knees position. Baseline currently 130s, +moderate variability, + accels, no decels.  Contractions q 4 minutes. Cervix 7-8/90/-2. Pitocin off for now, will continue amnioinfusion.   Continue close observation.  Jaynie Collins, MD, FACOG Obstetrician & Gynecologist, [facility] for [employer], [facility]

## 2019-10-26 MED ORDER — IBUPROFEN 600 MG PO TABS
600.0000 mg | ORAL_TABLET | Freq: Three times a day (TID) | ORAL | 0 refills | Status: DC | PRN
Start: 2019-10-26 — End: 2019-11-22

## 2019-10-26 MED ORDER — ACETAMINOPHEN 325 MG PO TABS: 650.0000 mg | ORAL_TABLET | Freq: Four times a day (QID) | ORAL | Status: DC | PRN

## 2019-10-26 MED ORDER — COCONUT OIL OIL: 1.0000 "application " | TOPICAL_OIL | 0 refills | Status: DC | PRN

## 2019-10-26 NOTE — Lactation Note (Signed)
This note was copied from a baby's chart. Lactation Consultation Note  Patient Name: Chelsea Wallace [id number]'O Date: 10/26/2019 Reason for consult: Follow-up assessment   Mother is a P4, infant is 65 hours old and is now at % wt loss.   Mother reports that infant is not feeding well. She reports that infant has has only two feeding with 8-10 mins of suckling.    Mother reports that she has pumped once last night.   Mother was observed with infant latched on at the right breast. Observed infant suckling with a few audible swallows. Infant sustained latch for 15  mins. Infant was still breastfeeding when [facility] left the room.  Mother was given a #24 NS with instructions to use if needed.  Discussed treatment and prevention of engorgement.   Plan of Care : Breastfeed infant with feeding cues Supplement infant with ebm/formula, or donor milk according to supplemental guidelines. Pump using a DEBP after each feeding for 15-20 mins.   Mother to continue to cue base feed infant and feed at least 8-12 times or more in 24 hours and advised to allow for cluster feeding infant as needed.   Mother to continue to due STS. Mother is aware of available LC services at [facility], BFSG'S, OP Dept, and phone # for questions or concerns about breastfeeding.  Mother receptive to all teaching and plan of care.     Maternal Data    Feeding Feeding Type: Breast Fed  LATCH Score Latch: Grasps breast easily, tongue down, lips flanged, rhythmical sucking.  Audible Swallowing: A few with stimulation  Type of Nipple: Everted at rest and after stimulation  Comfort (Breast/Nipple): Soft / non-tender  Hold (Positioning): Assistance needed to correctly position infant at breast and maintain latch.  LATCH Score: 8  Interventions Interventions: Breast feeding basics reviewed;Assisted with latch;Skin to skin;Hand express;Breast compression;Adjust position;Support pillows;Position options;Hand  pump;DEBP  Lactation Tools Discussed/Used     Consult Status Consult Status: Complete    Michel Bickers 10/26/2019, 10:27 AM

## 2019-10-26 NOTE — Discharge Instructions (Signed)

## 2019-10-26 NOTE — Discharge Summary (Signed)
Postpartum Discharge Summary    Patient Name: Chelsea Wallace DOB: [birth year] MRN: [id number]  Date of admission: 10/24/2019 Delivery date:10/25/2019  Delivering provider: Randa Ngo  Date of discharge: 10/26/2019  Admitting diagnosis: [redacted] weeks gestation of pregnancy [Z3A.40] Intrauterine pregnancy: [redacted]w[redacted]d    Secondary diagnosis:  Principal Problem:   Vaginal delivery Active Problems:   Genetic carrier   Iron deficiency anemia during pregnancy   [redacted] weeks gestation of pregnancy  Additional problems: as noted above   Discharge diagnosis: Vaginal delivery                                            Post partum procedures:post-placental Liletta IUD Augmentation: Pitocin and Cytotec Complications: Shoulder dystocia x15 seconds (resolved s/p McRoberts, suprapubic pressure)  Hospital course: Induction of Labor With Vaginal Delivery   29y.o. yo [id number] admitted to the hospital 10/24/2019 for induction of labor.  Indication for induction: Elective.  Patient had an uncomplicated labor course as follows: Membrane Rupture Time/Date: 3:30 AM ,10/25/2019   Delivery Method:Vaginal, Spontaneous  Episiotomy: None  Lacerations:  None  Details of delivery can be found in separate delivery note.  Patient had a routine postpartum course. Patient is discharged home 10/26/19.  Newborn Data: Birth date:10/25/2019  Birth time:7:42 AM  Gender:Female  Living status:Living  Apgars:8 ,9  Weight:4035 g   Magnesium Sulfate received: No BMZ received: No Rhophylac:N/A MMR:N/A T-DaP:declined Flu: declined Transfusion:No  Physical exam  Vitals:   10/25/19 1459 10/25/19 1809 10/25/19 2205 10/26/19 0603  BP: (!) 118/57 110/67 114/72 107/78  Pulse: 84 77 71 75  Resp: _0 Temp: 98.1 F (36.7 C) 98.3 F (36.8 C) 97.9 F (36.6 C) (!) 97.5 F (36.4 C)  TempSrc: Oral Oral Oral Oral  SpO2: 99% 100% 100% 100%  Weight:      Height:       General: alert, cooperative  and no distress Lochia: appropriate Uterine Fundus: firm Incision: N/A DVT Evaluation: No evidence of DVT seen on physical exam. No cords or calf tenderness. No significant calf/ankle edema. Labs: Lab Results  Component Value Date   WBC 6.9 10/24/2019   HGB 10.9 (L) 10/24/2019   HCT 34.6 (L) 10/24/2019   MCV 89.6 10/24/2019   PLT 181 10/24/2019   CMP Latest Ref Rng & Units 05/14/2019  Glucose 70 - 99 mg/dL 106(H)  BUN 6 - 20 mg/dL 9  Creatinine 0.44 - 1.00 mg/dL 0.65  Sodium 135 - 145 mmol/L 135  Potassium 3.5 - 5.1 mmol/L 3.8  Chloride 98 - 111 mmol/L 102  CO2 22 - 32 mmol/L 24  Calcium 8.9 - 10.3 mg/dL 8.7(L)  Total Protein 6.5 - 8.1 g/dL 6.5  Total Bilirubin 0.3 - 1.2 mg/dL 0.7  Alkaline Phos 38 - 126 U/L 53  AST 15 - 41 U/L 20  ALT 0 - 44 U/L 18   Edinburgh Score: Edinburgh Postnatal Depression Scale Screening Tool 10/26/2019  I have been able to laugh and see the funny side of things. 0  I have looked forward with enjoyment to things. 0  I have blamed myself unnecessarily when things went wrong. 0  I have been anxious or worried for no good reason. 0  I have felt scared or panicky for no good reason. 0  Things have been getting on top of me.  0  I have been so unhappy that I have had difficulty sleeping. 0  I have felt sad or miserable. 0  I have been so unhappy that I have been crying. 0  The thought of harming myself has occurred to me. 0  Edinburgh Postnatal Depression Scale Total 0     After visit meds:  Allergies as of 10/26/2019   No Known Allergies     Medication List    STOP taking these medications   Comfort Fit Maternity Supp Med Misc   cyclobenzaprine 10 MG tablet Commonly known as: FLEXERIL   famotidine 20 MG tablet Commonly known as: Pepcid   metoCLOPramide 5 MG tablet Commonly known as: Reglan   omeprazole 20 MG capsule Commonly known as: PriLOSEC   ondansetron 4 MG disintegrating tablet Commonly known as: Zofran ODT    pantoprazole 20 MG tablet Commonly known as: Protonix     TAKE these medications   acetaminophen 325 MG tablet Commonly known as: Tylenol Take 2 tablets (650 mg total) by mouth every 6 (six) hours as needed.   coconut oil Oil Apply 1 application topically as needed (nipple pain).   ibuprofen 600 MG tablet Commonly known as: ADVIL Take 1 tablet (600 mg total) by mouth every 8 (eight) hours as needed.   Prenate Mini 29-0.6-0.4-350 MG Caps Take 1 capsule by mouth daily before breakfast.        Discharge home in stable condition Infant Feeding: breast and bottle Infant Disposition:home with mother Discharge instruction: per After Visit Summary and Postpartum booklet. Activity: Advance as tolerated. Pelvic rest for 6 weeks.  Diet: routine diet Future Appointments: Future Appointments  Date Time Provider [place]  11/22/2019  2:40 PM Leftwich-Kirby, Kathie Dike, CNM [facility] None   Follow up Visit: Sent message to [facility] to schedule PP appt & IUD string check in 4 weeks.  Please schedule this patient for a In person postpartum visit in 4 weeks with the following provider: Any provider. Additional Postpartum F/U: IUD string check _0  appt Low risk pregnancy complicated by: Iron deficiency anemia, SMA carrier Delivery mode:  Vaginal, Spontaneous  Anticipated Birth Control:  PP IUD placed   10/26/2019 Randa Ngo, MD

## 2019-10-27 ENCOUNTER — Ambulatory Visit: Payer: Self-pay

## 2019-10-27 NOTE — Lactation Note (Signed)
This note was copied from a baby's chart. Lactation Consultation Note  Patient Name: [name] EUMPN'T Date: 10/27/2019 Reason for consult: Nipple pain/trauma  Follow up with 48 hours old infant with 7% weight loss total. Mother reports breastfeeding is going well.  Mother reports breast fullness, firmness in her breasts. Discussed engorgement signs and what to expect with milk coming in.   Mother mentioned some nipple discomfort and sensitivity. Mother was given coconut oil but has only used once.  Reviewed hunger and fullness cues, signs of intake, feeding 8-12 times in 24 h period, normal newborn behavior and cluster feeding. Reviewed Lactation Services brochure and other local resources available such as WIC. Praised mother for her efforts and dedication.   Discharge Plan: 1-Breastfeeding often until breast feel empty and how to use ice and massage for relief.  2-Using coconut and/or olive oil for nipple relief. 3-Ensuring infant has a deep latch and/or chin tugging to improve latch. 4-Contacting LC services for support as needed for any questions or concerns.  Maternal Data Formula Feeding for Exclusion: No Has patient been taught Hand Expression?: Yes  Feeding Feeding Type: Breast Fed  Interventions Interventions: Breast feeding basics reviewed;Coconut oil  Lactation Tools Discussed/Used WIC Program: No (Mother is going to arrange an appointment.)   Consult Status Consult Status: Complete Date: 10/27/19 Follow-up type: Call as needed    [name] A Higuera [name] 10/27/2019, 8:35 AM

## 2019-11-11 ENCOUNTER — Telehealth: Payer: Self-pay

## 2019-11-11 NOTE — Telephone Encounter (Signed)
Pt called and reports that she is having persistant lower back pain since delivery a couple weeks ago. Pt reports ibuprofen and tylenol are not helping relieve this pain and would like to know if Flexeril is safe to take or if there's anything else she can take to relieve the pain. Pt reports she is currently breastfeeding. Please advise.

## 2019-11-16 ENCOUNTER — Ambulatory Visit: Payer: Medicaid Other | Admitting: Obstetrics and Gynecology

## 2019-11-17 ENCOUNTER — Ambulatory Visit: Payer: Medicaid Other | Admitting: Obstetrics and Gynecology

## 2019-11-22 ENCOUNTER — Other Ambulatory Visit: Payer: Self-pay

## 2019-11-22 ENCOUNTER — Encounter: Payer: Self-pay | Admitting: Advanced Practice Midwife

## 2019-11-22 ENCOUNTER — Ambulatory Visit (INDEPENDENT_AMBULATORY_CARE_PROVIDER_SITE_OTHER): Payer: Medicaid Other | Admitting: Advanced Practice Midwife

## 2019-11-22 VITALS — BP 129/91 | HR 72 | Wt 187.0 lb

## 2019-11-22 DIAGNOSIS — M549 Dorsalgia, unspecified: Secondary | ICD-10-CM

## 2019-11-22 DIAGNOSIS — Z975 Presence of (intrauterine) contraceptive device: Secondary | ICD-10-CM

## 2019-11-22 DIAGNOSIS — R52 Pain, unspecified: Secondary | ICD-10-CM

## 2019-11-22 DIAGNOSIS — O99893 Other specified diseases and conditions complicating puerperium: Secondary | ICD-10-CM

## 2019-11-22 DIAGNOSIS — R102 Pelvic and perineal pain: Secondary | ICD-10-CM

## 2019-11-22 LAB — POCT URINALYSIS DIPSTICK
Bilirubin, UA: NEGATIVE
Glucose, UA: NEGATIVE
Ketones, UA: NEGATIVE
Nitrite, UA: NEGATIVE
Protein, UA: NEGATIVE
Spec Grav, UA: 1.015 (ref 1.010–1.025)
Urobilinogen, UA: 0.2 E.U./dL
pH, UA: 6.5 (ref 5.0–8.0)

## 2019-11-22 MED ORDER — NAPROXEN 500 MG PO TBEC: 500.0000 mg | DELAYED_RELEASE_TABLET | Freq: Two times a day (BID) | ORAL | 0 refills | Status: DC | PRN

## 2019-11-22 MED ORDER — CYCLOBENZAPRINE HCL 10 MG PO TABS: 10.0000 mg | ORAL_TABLET | Freq: Three times a day (TID) | ORAL | 0 refills | Status: DC | PRN

## 2019-11-22 NOTE — Progress Notes (Signed)
Post Partum Visit Note  Chelsea Wallace is a 29 y.o. [phone] female who presents for a postpartum visit. She is 4 weeks postpartum following a normal spontaneous vaginal delivery.  I have fully reviewed the prenatal and intrapartum course. The delivery was at 40 gestational weeks.  Anesthesia: epidural. Postpartum course has been Unremarkable. Baby is doing well. Baby is feeding by breast. Bleeding staining only. Bowel function is normal. Bladder function is normal. Patient is not sexually active. Contraception method is IUD. Postpartum depression screening: negative.   The pregnancy intention screening data noted above was reviewed. Potential methods of contraception were discussed. The patient elected to proceed with IUD or IUS.    Edinburgh Postnatal Depression Scale - 11/22/19 1513      Edinburgh Postnatal Depression Scale:  In the Past 7 Days   I have been able to laugh and see the funny side of things. 0    I have looked forward with enjoyment to things. 0    I have blamed myself unnecessarily when things went wrong. 0    I have been anxious or worried for no good reason. 2    I have felt scared or panicky for no good reason. 0    Things have been getting on top of me. 1    I have been so unhappy that I have had difficulty sleeping. 0    I have felt sad or miserable. 0    I have been so unhappy that I have been crying. 0    The thought of harming myself has occurred to me. 0    Edinburgh Postnatal Depression Scale Total 3            The following portions of the patient's history were reviewed and updated as appropriate: allergies, current medications, past family history, past medical history, past social history, past surgical history and problem list.  Review of Systems Pertinent items noted in HPI and remainder of comprehensive ROS otherwise negative.    Objective:  Blood pressure (!) 129/91, pulse 72, weight 187 lb (84.8 kg), last menstrual period 01/11/2019,  currently breastfeeding.  VS reviewed, nursing note reviewed,  Constitutional: well developed, well nourished, no distress HEENT: normocephalic CV: normal rate Pulm/chest wall: normal effort Breast Exam: Deferred Abdomen: soft Neuro: alert and oriented x 3 Skin: warm, dry Psych: affect normal Pelvic exam: Cervix pink, visually closed, without lesion, scant white creamy discharge, vaginal walls and external genitalia normal Bimanual exam: Cervix 0/long/high, firm, anterior, neg CMT, uterus nontender, nonenlarged, adnexa without tenderness, enlargement, or mass Assessment:   1. Back pain, unspecified back location, unspecified back pain laterality, unspecified chronicity  - POCT Urinalysis Dipstick - Urine Culture  2. Postpartum pain  - Ambulatory referral to Physical Therapy  3. Pelvic pain in female --Pain similar to pain in third trimester of pregnancy, at pubic symphysis and with movement. --Rest/ice/heat/warm bath/Naproxen/Flexeril (see below) --Will refer to PT for pelvic floor work  - Ambulatory referral to Physical Therapy - US PELVIC COMPLETE WITH TRANSVAGINAL; Future - cyclobenzaprine (FLEXERIL) 10 MG tablet; Take 1 tablet (10 mg total) by mouth 3 (three) times daily as needed for muscle spasms.  Dispense: 30 tablet; Refill: 0 - naproxen (EC NAPROSYN) 500 MG EC tablet; Take 1 tablet (500 mg total) by mouth 2 (two) times daily as needed.  Dispense: 30 tablet; Refill: 0  4. IUD (intrauterine device) in place --Strings cut today to 3-4 cm from internal os, --No abnormalities visualized, other than long  strings, which is usual when placed after delivery but pt with pelvic pain.  Will evaluate IUD position with [place]. - US PELVIC COMPLETE WITH TRANSVAGINAL; Future  Plan:   Essential components of care per ACOG recommendations:  1.  Mood and well being: Patient with negative depression screening today. Reviewed local resources for support.  - Patient does not use tobacco.   - hx of drug use? No    2. Infant care and feeding:  -Patient currently breastmilk feeding? Yes  If breastmilk feeding discussed return to work and pumping. If needed, patient was provided letter for work to allow for every 2-3 hr pumping breaks, and to be granted a private location to express breastmilk and refrigerated area to store breastmilk. Reviewed importance of draining breast regularly to support lactation. -Social determinants of health (SDOH) reviewed in EPIC. No concerns  3. Sexuality, contraception and birth spacing - Patient does not want a pregnancy in the next year.  - Reviewed forms of contraception in tiered fashion. Patient desired IUD today.  Placed in hospital. - Discussed birth spacing of 18 months  4. Sleep and fatigue -Encouraged family/partner/community support of 4 hrs of uninterrupted sleep to help with mood and fatigue  5. Physical Recovery  - Discussed patients delivery - Patient had no lacerations, perineal healing reviewed. Patient expressed understanding - Patient has urinary incontinence? No  - Patient is safe to resume physical and sexual activity  6.  Health Maintenance - Last pap smear done 01/28/2018 and was normal with negative HPV.    Sharen Counter, CNM [facility] [employer]

## 2019-11-25 LAB — URINE CULTURE: Organism ID, Bacteria: NO GROWTH

## 2019-12-07 ENCOUNTER — Other Ambulatory Visit: Payer: Self-pay

## 2019-12-07 ENCOUNTER — Ambulatory Visit
Admission: RE | Admit: 2019-12-07 | Discharge: 2019-12-07 | Disposition: A | Discharge status: Home / Self Care | Payer: Medicaid Other | Source: Ambulatory Visit | Attending: Advanced Practice Midwife | Admitting: Advanced Practice Midwife

## 2019-12-07 DIAGNOSIS — R102 Pelvic and perineal pain: Secondary | ICD-10-CM | POA: Diagnosis present

## 2019-12-07 DIAGNOSIS — Z975 Presence of (intrauterine) contraceptive device: Secondary | ICD-10-CM | POA: Diagnosis present

## 2019-12-15 ENCOUNTER — Ambulatory Visit: Payer: Medicaid Other | Admitting: Obstetrics

## 2019-12-16 ENCOUNTER — Other Ambulatory Visit (HOSPITAL_COMMUNITY)
Admission: RE | Admit: 2019-12-16 | Discharge: 2019-12-16 | Disposition: A | Discharge status: Home / Self Care | Payer: Medicaid Other | Source: Ambulatory Visit | Attending: Obstetrics | Admitting: Obstetrics

## 2019-12-16 ENCOUNTER — Other Ambulatory Visit: Payer: Self-pay

## 2019-12-16 ENCOUNTER — Ambulatory Visit (INDEPENDENT_AMBULATORY_CARE_PROVIDER_SITE_OTHER): Payer: Medicaid Other | Admitting: Obstetrics

## 2019-12-16 ENCOUNTER — Encounter: Payer: Self-pay | Admitting: Obstetrics

## 2019-12-16 VITALS — BP 121/80 | HR 87 | Ht 65.0 in | Wt 189.0 lb

## 2019-12-16 DIAGNOSIS — R102 Pelvic and perineal pain: Secondary | ICD-10-CM

## 2019-12-16 DIAGNOSIS — R519 Headache, unspecified: Secondary | ICD-10-CM

## 2019-12-16 DIAGNOSIS — Z975 Presence of (intrauterine) contraceptive device: Secondary | ICD-10-CM

## 2019-12-16 MED ORDER — BUTALBITAL-APAP-CAFFEINE 50-325-40 MG PO TABS: 1.0000 | ORAL_TABLET | Freq: Four times a day (QID) | ORAL | 2 refills | Status: DC | PRN

## 2019-12-16 MED ORDER — OXYCODONE HCL 5 MG PO TABS: 5.0000 mg | ORAL_TABLET | ORAL | 0 refills | Status: DC | PRN

## 2019-12-16 NOTE — Progress Notes (Signed)
GYN presents for pelvic pain 8-10/10 x 4 weeks, headache 10/10, blurry vision, floaters.

## 2019-12-16 NOTE — Progress Notes (Signed)
[name], female   DOB: 1990/06/13, 29 y.o.   MRN: [id number]  Chief Complaint  Patient presents with  . Pelvic Pain    HPI Chelsea Wallace is a 29 y.o. female.  Complains of pelvic pain for the past 4 weeks since having a NSVD with IUD placement ~ 6 weeks ago.  She has also had a HA with blurred vision for the past several weeks. HPI  Past Medical History:  Diagnosis Date  . GERD (gastroesophageal reflux disease)    on prescription for it; pt cannot remember name of med.    Past Surgical History:  Procedure Laterality Date  . MANDIBLE RECONSTRUCTION     lower jaw surgery 2011  . MANDIBLE SURGERY Bilateral    age 52 or 33, manibles fx to adjust bite    Family History  Problem Relation Age of Onset  . Diabetes Mother   . Diabetes Maternal Grandfather   . Hypertension Paternal Grandmother   . Hypertension Paternal Grandfather     Social History Social History   Tobacco Use  . Smoking status: Never Smoker  . Smokeless tobacco: Never Used  Vaping Use  . Vaping Use: Never used  Substance Use Topics  . Alcohol use: No  . Drug use: No    No Known Allergies  Current Outpatient Medications  Medication Sig Dispense Refill  . acetaminophen (TYLENOL) 325 MG tablet Take 2 tablets (650 mg total) by mouth every 6 (six) hours as needed. (Patient not taking: Reported on 11/22/2019)    . butalbital-acetaminophen-caffeine (FIORICET) 50-325-40 MG tablet Take 1-2 tablets by mouth every 6 (six) hours as needed for headache. 20 tablet 2  . coconut oil OIL Apply 1 application topically as needed (nipple pain). (Patient not taking: Reported on 11/22/2019)  0  . cyclobenzaprine (FLEXERIL) 10 MG tablet Take 1 tablet (10 mg total) by mouth 3 (three) times daily as needed for muscle spasms. 30 tablet 0  . naproxen (EC NAPROSYN) 500 MG EC tablet Take 1 tablet (500 mg total) by mouth 2 (two) times daily as needed. 30 tablet 0  . oxyCODONE (ROXICODONE) 5 MG immediate release  tablet Take 1 tablet (5 mg total) by mouth every 4 (four) hours as needed for severe pain. 20 tablet 0  . Prenat w/o A-FeCbn-Meth-FA-DHA (PRENATE MINI) 29-0.6-0.4-350 MG CAPS Take 1 capsule by mouth daily before breakfast. (Patient not taking: Reported on 11/22/2019) 90 capsule 4   No current facility-administered medications for this visit.    Review of Systems Review of Systems Constitutional: negative for fatigue and weight loss Respiratory: negative for cough and wheezing Cardiovascular: negative for chest pain, fatigue and palpitations Gastrointestinal: negative for abdominal pain and change in bowel habits Genitourinary: positive for pelvic pain Integument/breast: negative for nipple discharge Musculoskeletal:negative for myalgias Neurological: positive for headache and blurred vision Behavioral/Psych: negative for abusive relationship, depression Endocrine: negative for temperature intolerance      Blood pressure 121/80, pulse 87, height 5\' 5"  (1.651 m), weight 189 lb (85.7 kg), last menstrual period 01/11/2019, currently breastfeeding.  Physical Exam Physical Exam           General: Alert and no distress Abdomen:  normal findings: no organomegaly, soft, non-tender and no hernia  Pelvis:  External genitalia: normal general appearance Urinary system: urethral meatus normal and bladder without fullness, nontender Vaginal: normal without tenderness, induration or masses Cervix: normal appearance.  Positive CMT Adnexa: normal bimanual exam Uterus: anteverted and tender, normal size  50% of 20 min visit spent on counseling and coordination of care.   Data Reviewed Labs  Assessment     1. Pelvic pain in female Rx: - oxyCODONE (ROXICODONE) 5 MG immediate release tablet; Take 1 tablet (5 mg total) by mouth every 4 (four) hours as needed for severe pain.  Dispense: 20 tablet; Refill: 0 - Cervicovaginal ancillary only( [place])  2. IUD (intrauterine device) in place -  ultrasound reveals normal intrauterine position of IUD  3. Nonintractable episodic headache, unspecified headache type Rx: - butalbital-acetaminophen-caffeine (FIORICET) 50-325-40 MG tablet; Take 1-2 tablets by mouth every 6 (six) hours as needed for headache.  Dispense: 20 tablet; Refill: 2    Plan   Follow up in 2 weeks   Meds ordered this encounter  Medications  . butalbital-acetaminophen-caffeine (FIORICET) 50-325-40 MG tablet    Sig: Take 1-2 tablets by mouth every 6 (six) hours as needed for headache.    Dispense:  20 tablet    Refill:  2  . oxyCODONE (ROXICODONE) 5 MG immediate release tablet    Sig: Take 1 tablet (5 mg total) by mouth every 4 (four) hours as needed for severe pain.    Dispense:  20 tablet    Refill:  0     Brock Bad, MD 12/16/2019 4:48 PM

## 2019-12-17 LAB — CERVICOVAGINAL ANCILLARY ONLY
Bacterial Vaginitis (gardnerella): NEGATIVE
Candida Glabrata: NEGATIVE
Candida Vaginitis: NEGATIVE
Chlamydia: NEGATIVE
Comment: NEGATIVE
Comment: NEGATIVE
Comment: NEGATIVE
Comment: NEGATIVE
Comment: NEGATIVE
Comment: NORMAL
Neisseria Gonorrhea: NEGATIVE
Trichomonas: NEGATIVE

## 2019-12-23 ENCOUNTER — Telehealth (HOSPITAL_COMMUNITY): Payer: Self-pay | Admitting: Lactation Services

## 2019-12-23 NOTE — Telephone Encounter (Signed)
This patient called the [facility] office at 1412 , message taken off at 1520,  In the mean time mom had come to the main desk at [facility] with breast feeding questions and she was referred to this LC.  This LC called mom back and spend time answering her breast feeding questions regarding her decreased Milk supply.  Per mom - baby  is 47 months old and has been breast feeding and also supplementing with EBM.  Mom noticed in the last 2 weeks the volume she has been pumping has decreased from 8 oz total both breast to 3 oz.  Mom denies and changes in life style or birth control. Baby was breast feeding every 2-3 hours and doesn't seem satisfied the last 2 weeks so mom has been supplementing with a bottle EBM 3-4 oz.  Per mom due to decrease in volume of EBM had added power pumping 1-2 times a day , eating lactation cookies, and drinking plenty of body Armor drink, and cheese and crackers and has not noted a increase as of yet.  Wets diapers > 5-6 per day and stools ( yellow ) twice a week) . Per mom the [facility] says its normal.  LC recommended checking her flange size and if she is using the larger size #27 F to decrease to the #24 F is its comfortable.  Call the [facility] office and check what type of formula she should use.  Call and make a F/U LC O/P appt with [facility] for next Monday or Tuesday to check a latch , pre and post weight and for milk supply.  In the mean time to continue to breast feed the baby with feeding cues and supplement as  Needed to meet the babies needs and be satisfied,.  Keep the pumping up.  Mom receptive to recommendations and will F/U/  [facility] praised her for her breastfeeding efforts.

## 2019-12-30 ENCOUNTER — Ambulatory Visit (INDEPENDENT_AMBULATORY_CARE_PROVIDER_SITE_OTHER): Payer: Medicaid Other | Admitting: Obstetrics

## 2019-12-30 ENCOUNTER — Encounter: Payer: Self-pay | Admitting: Obstetrics

## 2019-12-30 ENCOUNTER — Other Ambulatory Visit: Payer: Self-pay

## 2019-12-30 VITALS — BP 128/83 | HR 77 | Ht 65.0 in | Wt 198.4 lb

## 2019-12-30 DIAGNOSIS — Z975 Presence of (intrauterine) contraceptive device: Secondary | ICD-10-CM | POA: Diagnosis not present

## 2019-12-30 DIAGNOSIS — R102 Pelvic and perineal pain: Secondary | ICD-10-CM

## 2019-12-30 NOTE — Progress Notes (Signed)
[name], female   DOB: 02-Jan-1991, 29 y.o.   MRN: [id number]  Chief Complaint  Patient presents with  . Follow-up    HPI Chelsea Wallace is a 29 y.o. female.  History of pelvic pain since having a NSVD ~ 2 months ago with IUD insertion prior to discharge.  She states that she actually had this same pain prior to delivery, and that her labor was induced because of this pain. HPI  Past Medical History:  Diagnosis Date  . GERD (gastroesophageal reflux disease)    on prescription for it; pt cannot remember name of med.    Past Surgical History:  Procedure Laterality Date  . MANDIBLE RECONSTRUCTION     lower jaw surgery 2011  . MANDIBLE SURGERY Bilateral    age 78 or 87, manibles fx to adjust bite    Family History  Problem Relation Age of Onset  . Diabetes Mother   . Diabetes Maternal Grandfather   . Hypertension Paternal Grandmother   . Hypertension Paternal Grandfather     Social History Social History   Tobacco Use  . Smoking status: Never Smoker  . Smokeless tobacco: Never Used  Vaping Use  . Vaping Use: Never used  Substance Use Topics  . Alcohol use: No  . Drug use: No    No Known Allergies  Current Outpatient Medications  Medication Sig Dispense Refill  . acetaminophen (TYLENOL) 325 MG tablet Take 2 tablets (650 mg total) by mouth every 6 (six) hours as needed.    . butalbital-acetaminophen-caffeine (FIORICET) 50-325-40 MG tablet Take 1-2 tablets by mouth every 6 (six) hours as needed for headache. 20 tablet 2  . oxyCODONE (ROXICODONE) 5 MG immediate release tablet Take 1 tablet (5 mg total) by mouth every 4 (four) hours as needed for severe pain. (Patient not taking: Reported on 12/30/2019) 20 tablet 0  . Prenat w/o A-FeCbn-Meth-FA-DHA (PRENATE MINI) 29-0.6-0.4-350 MG CAPS Take 1 capsule by mouth daily before breakfast. (Patient not taking: Reported on 12/30/2019) 90 capsule 4   No current facility-administered medications for this  visit.    Review of Systems Review of Systems Constitutional: negative for fatigue and weight loss Respiratory: negative for cough and wheezing Cardiovascular: negative for chest pain, fatigue and palpitations Gastrointestinal: negative for abdominal pain and change in bowel habits Genitourinary: positive for pelvic pain Integument/breast: negative for nipple discharge Musculoskeletal:negative for myalgias Neurological: negative for gait problems and tremors Behavioral/Psych: negative for abusive relationship, depression Endocrine: negative for temperature intolerance      Blood pressure 128/83, pulse 77, height 5\' 5"  (1.651 m), weight 198 lb 6.4 oz (90 kg), last menstrual period 01/11/2019, currently breastfeeding.  Physical Exam Physical Exam           General: Alert and no distress Abdomen:  normal findings: no organomegaly, soft, non-tender and no hernia  Pelvis:  External genitalia: normal general appearance Urinary system: urethral meatus normal and bladder without fullness, nontender Vaginal: normal without tenderness, induration or masses Cervix: normal appearance.  Positive CMT Adnexa: bilateral adnexal tenderness.  No masses Uterus: anteverted and tender, normal size    50% of 20 min visit spent on counseling and coordination of care.   Data Reviewed Labs  Ultrasound Wet Prep and Cultures  Assessment     1. Pelvic pain in female - has diffuse lower pelvic tenderness, ? Etiology, work-up is negative - will continue close monitoring of pain  2. IUD (intrauterine device) in place - ultrasound WNL's for placement -  patient declines IUD Removal at this time    Plan   Follow up in 4 weeks  Brock Bad, MD 12/30/2019 11:00 AM

## 2019-12-30 NOTE — Progress Notes (Signed)
Patient presents for follow up from pelvic pain. She states that she is still having pelvic pain and still having abnormal bleeding. She states that sometimes bleeding is heavy and then light. She has not stopped bleeding since it was placed post placental delivery.

## 2020-01-04 ENCOUNTER — Telehealth: Payer: Self-pay

## 2020-01-04 NOTE — Telephone Encounter (Signed)
Patient is requesting extension of FMLA due to Pelvic Pain. Okay per Dr. Clearance Coots to extend FMLA until 01/27/20 which is when the patient will return for follow up.  Form completed.

## 2020-01-18 ENCOUNTER — Encounter: Payer: Medicaid Other | Attending: Advanced Practice Midwife | Admitting: Physical Therapy

## 2020-01-25 ENCOUNTER — Encounter: Payer: Medicaid Other | Admitting: Physical Therapy

## 2020-01-25 ENCOUNTER — Ambulatory Visit: Payer: Medicaid Other | Attending: Internal Medicine

## 2020-01-25 DIAGNOSIS — Z23 Encounter for immunization: Secondary | ICD-10-CM

## 2020-01-25 NOTE — Progress Notes (Signed)
   Covid-19 Vaccination Clinic  Name:  Chelsea Wallace    MRN: [id number] DOB: [birth year]  01/25/2020  Chelsea Wallace was observed post Covid-19 immunization for 15 minutes without incident. She was provided with Vaccine Information Sheet and instruction to access the V-Safe system.   Chelsea Wallace was instructed to call 911 with any severe reactions post vaccine: Marland Kitchen Difficulty breathing  . Swelling of face and throat  . A fast heartbeat  . A bad rash all over body  . Dizziness and weakness   Immunizations Administered    Name Date Dose VIS Date Route   Pfizer COVID-19 Vaccine 01/25/2020  2:37 PM 0.3 mL 11/03/2019 Intramuscular   Manufacturer: [employer], [employer]   Lot: [id number]   NDC: [address]-3

## 2020-01-27 ENCOUNTER — Other Ambulatory Visit: Payer: Self-pay

## 2020-01-27 ENCOUNTER — Ambulatory Visit (INDEPENDENT_AMBULATORY_CARE_PROVIDER_SITE_OTHER): Payer: Self-pay | Admitting: Obstetrics

## 2020-01-27 ENCOUNTER — Encounter: Payer: Self-pay | Admitting: Obstetrics

## 2020-01-27 VITALS — BP 124/86 | HR 81 | Wt 191.3 lb

## 2020-01-27 DIAGNOSIS — Z3009 Encounter for other general counseling and advice on contraception: Secondary | ICD-10-CM

## 2020-01-27 DIAGNOSIS — Z975 Presence of (intrauterine) contraceptive device: Secondary | ICD-10-CM

## 2020-01-27 DIAGNOSIS — R102 Pelvic and perineal pain: Secondary | ICD-10-CM

## 2020-01-27 DIAGNOSIS — G8929 Other chronic pain: Secondary | ICD-10-CM

## 2020-01-27 LAB — POCT URINALYSIS DIPSTICK
Bilirubin, UA: NEGATIVE
Glucose, UA: NEGATIVE
Ketones, UA: NEGATIVE
Leukocytes, UA: NEGATIVE
Nitrite, UA: NEGATIVE
Odor: NEGATIVE
Protein, UA: NEGATIVE
Spec Grav, UA: 1.015 (ref 1.010–1.025)
Urobilinogen, UA: 0.2 E.U./dL
pH, UA: 7 (ref 5.0–8.0)

## 2020-01-27 MED ORDER — ORTHO-NOVUM 1/35 (28) 1-35 MG-MCG PO TABS: 1.0000 | ORAL_TABLET | Freq: Every day | ORAL | 11 refills | Status: DC

## 2020-01-27 NOTE — Progress Notes (Signed)
[name], female   DOB: 13-Feb-1990, 30 y.o.   MRN: [id number]  Chief Complaint  Patient presents with  . Follow-up    HPI Chelsea Wallace is a 30 y.o. female.  Complains of pelvic pain since delivery of baby and IUD insertion in October 2021.  The pain is described as diffuse lower abdominal pain that is constant, 8/10, unrelieved by Iuprofen / Percocet. HPI  Past Medical History:  Diagnosis Date  . GERD (gastroesophageal reflux disease)    on prescription for it; pt cannot remember name of med.    Past Surgical History:  Procedure Laterality Date  . MANDIBLE RECONSTRUCTION     lower jaw surgery 2011  . MANDIBLE SURGERY Bilateral    age 8 or 10, manibles fx to adjust bite    Family History  Problem Relation Age of Onset  . Diabetes Mother   . Diabetes Maternal Grandfather   . Hypertension Paternal Grandmother   . Hypertension Paternal Grandfather     Social History Social History   Tobacco Use  . Smoking status: Never Smoker  . Smokeless tobacco: Never Used  Vaping Use  . Vaping Use: Never used  Substance Use Topics  . Alcohol use: No  . Drug use: No    No Known Allergies  Current Outpatient Medications  Medication Sig Dispense Refill  . ibuprofen (ADVIL) 800 MG tablet Take 800 mg by mouth every 8 (eight) hours as needed.    Marland Kitchen oxyCODONE (ROXICODONE) 5 MG immediate release tablet Take 1 tablet (5 mg total) by mouth every 4 (four) hours as needed for severe pain. 20 tablet 0  . acetaminophen (TYLENOL) 325 MG tablet Take 2 tablets (650 mg total) by mouth every 6 (six) hours as needed. (Patient not taking: Reported on 01/27/2020)    . Prenat w/o A-FeCbn-Meth-FA-DHA (PRENATE MINI) 29-0.6-0.4-350 MG CAPS Take 1 capsule by mouth daily before breakfast. (Patient not taking: No sig reported) 90 capsule 4   No current facility-administered medications for this visit.    Review of Systems Review of Systems Constitutional: negative for fatigue and  weight loss Respiratory: negative for cough and wheezing Cardiovascular: negative for chest pain, fatigue and palpitations Gastrointestinal: negative for abdominal pain and change in bowel habits Genitourinary: positive for pelvic pain Integument/breast: negative for nipple discharge Musculoskeletal:negative for myalgias Neurological: negative for gait problems and tremors Behavioral/Psych: negative for abusive relationship, depression Endocrine: negative for temperature intolerance      Blood pressure 124/86, pulse 81, weight 191 lb 4.8 oz (86.8 kg), currently breastfeeding.  Physical Exam Physical Exam           General:  Alert and no distress Abdomen:  normal findings: no organomegaly, soft, non-tender and no hernia  Pelvis:  External genitalia: normal general appearance Urinary system: urethral meatus normal and bladder without fullness, nontender Vaginal: normal without tenderness, induration or masses Cervix: normal appearance Adnexa: normal bimanual exam Uterus: anteverted and non-tender, normal size    50% of 20 min visit spent on counseling and coordination of care.   Data Reviewed Wet Prep and Cultures  Assessment     1. Chronic pelvic pain in female Rx: - POCT Urinalysis Dipstick - Ambulatory referral to Urogynecology  2. IUD (intrauterine device) in place  3. General counseling and advice on female contraception - wants to continue with IUD    Plan  Follow up in 3 months   Orders Placed This Encounter  Procedures  . Ambulatory referral to Urogynecology  Referral Priority:   Routine    Referral Type:   Consultation    Referral Reason:   Specialty Services Required    Requested Specialty:   Urology    Number of Visits Requested:   1  . POCT Urinalysis Dipstick   Meds ordered this encounter  Medications  . DISCONTD: norethindrone-ethinyl estradiol 1/35 (ORTHO-NOVUM 1/35, 28,) tablet    Sig: Take 1 tablet by mouth daily.    Dispense:  28 tablet     Refill:  11     Brock Bad, MD 01/27/2020 12:56 PM

## 2020-01-27 NOTE — Progress Notes (Signed)
Pt presents to f/u pelvic pain Percocet and IB not working  Pt c/o consistent pain 8/10

## 2020-02-01 ENCOUNTER — Encounter: Payer: Medicaid Other | Admitting: Physical Therapy

## 2020-02-08 ENCOUNTER — Encounter: Payer: Medicaid Other | Admitting: Physical Therapy

## 2020-02-09 ENCOUNTER — Telehealth: Payer: Self-pay

## 2020-02-09 NOTE — Telephone Encounter (Signed)
Spoke with pt states no one called to schedule Urogyn appt  I called Urogyn and they have been attempting to reach pt. Pt is scheduled Feb 15 arrive at 8:15 am. Pt agrees.  Pt states she also received the appt info in Mychart.

## 2020-02-15 ENCOUNTER — Other Ambulatory Visit: Payer: Self-pay

## 2020-02-15 ENCOUNTER — Encounter: Payer: Self-pay | Admitting: Physical Therapy

## 2020-02-15 ENCOUNTER — Encounter: Payer: Medicaid Other | Attending: Advanced Practice Midwife | Admitting: Physical Therapy

## 2020-02-15 DIAGNOSIS — R102 Pelvic and perineal pain: Secondary | ICD-10-CM | POA: Diagnosis present

## 2020-02-15 DIAGNOSIS — R252 Cramp and spasm: Secondary | ICD-10-CM

## 2020-02-15 DIAGNOSIS — M6281 Muscle weakness (generalized): Secondary | ICD-10-CM | POA: Insufficient documentation

## 2020-02-15 NOTE — Therapy (Signed)
[facility] [address], [address] [place], [place], [address] Phone: [phone]   Fax:  [phone]  Physical Therapy Evaluation  Patient Details  Name: Chelsea Wallace MRN: [id number] Date of Birth: 06/20/1990 Referring Provider (PT): Sharen Counter CNM   Encounter Date: 02/15/2020   PT End of Session - 02/15/20 1557    Visit Number 1    Date for PT Re-Evaluation 05/09/20    Authorization Type Medicaid [employer]    PT Start Time 1500    PT Stop Time 1545    PT Time Calculation (min) 45 min    Activity Tolerance Patient tolerated treatment well;Patient limited by pain    Behavior During Therapy [facility] for tasks assessed/performed           Past Medical History:  Diagnosis Date  . GERD (gastroesophageal reflux disease)    on prescription for it; pt cannot remember name of med.    Past Surgical History:  Procedure Laterality Date  . MANDIBLE RECONSTRUCTION     lower jaw surgery 2011  . MANDIBLE SURGERY Bilateral    age 32 or 21, manibles fx to adjust bite    There were no vitals filed for this visit.    Subjective Assessment - 02/15/20 1504    Subjective Pain started the begining of her third trimester for her last pregnancy. Pain started 09/2019. Had a baby 10/25/2019.    Patient Stated Goals stop the pain    Currently in Pain? Yes    Pain Score 7     Pain Location Pelvis    Pain Orientation Right;Left;Lower    Pain Descriptors / Indicators Other (Comment);Contraction;Aching;Sharp;Shooting   popping   Pain Type Acute pain    Pain Onset More than a month ago    Pain Frequency Constant    Aggravating Factors  turning in bed, bending, lifting, standing and sitting too long, lay on back and has back pain    Pain Relieving Factors laying on back or side    Multiple Pain Sites No              [facility] PT Assessment - 02/15/20 0001      Assessment   Medical Diagnosis O90.89, R52 Postpartum pain;  R102.2 Pelvic pain in female    Referring Provider (PT) Sharen Counter CNM    Onset Date/Surgical Date 09/15/19    Prior Therapy none      Precautions   Precautions None      Restrictions   Weight Bearing Restrictions No      Balance Screen   Has the patient fallen in the past 6 months No    Has the patient had a decrease in activity level because of a fear of falling?  No    Is the patient reluctant to leave their home because of a fear of falling?  No      Prior Function   Level of Independence Independent    Vocation Requirements when go back to work will have to sit for long periods    Leisure none      Cognition   Overall Cognitive Status Within Functional Limits for tasks assessed      Posture/Postural Control   Posture/Postural Control No significant limitations      ROM / Strength   AROM / PROM / Strength AROM;PROM;Strength      AROM   Lumbar Extension decreased by 25%    Lumbar - Right Rotation decreased by 25%  Strength   Right Hip Flexion 4/5    Right Hip Extension 4/5    Right Hip External Rotation  4/5    Right Hip Internal Rotation 4/5    Right Hip ABduction 3+/5    Left Hip ABduction 3/5      Palpation   SI assessment  Right ASIS anteriorly and shallow with tenderness; right pubic bone deeper    Palpation comment tenderness suprapubically      Special Tests    Special Tests Hip Special Tests      Pelvic Compression   Findings Positive    Side Right    comment pain      Trendelenburg Test   Findings Positive    Side Right    Comments drop left pelvis                      Objective measurements completed on examination: See above findings.     Pelvic Floor Special Questions - 02/15/20 0001    Prior Pregnancies Yes    Number of Vaginal Deliveries 4   6, 4, 3, 3 months   Diastasis Recti none    Currently Sexually Active No    History of sexually transmitted disease --   during third tirmeste pain full   Urinary  Leakage No    Urinary urgency No    Fecal incontinence No    Exam Type Deferred   still bleeding           [facility] Adult PT Treatment/Exercise - 02/15/20 0001      Self-Care   Self-Care Other Self-Care Comments    Other Self-Care Comments  massaging the inner thighs to relax the hip adductors      Therapeutic Activites    Therapeutic Activities ADL's    ADL's how to move in bed with her legs together to decrease strain on the pubic symphysis      Lumbar Exercises: Supine   Ab Set 10 reps;5 seconds    AB Set Limitations with tactile cues to bring the rib cage and pubic bone toegther      Manual Therapy   Manual Therapy Joint mobilization;Muscle Energy Technique;Soft tissue mobilization                  PT Education - 02/15/20 1553    Education Details Access Code: [email or web address]  ; how to move in bed with her legs together    Person(s) Educated Patient    Methods Explanation;Demonstration;Verbal cues;Handout    Comprehension Returned demonstration;Verbalized understanding            PT Short Term Goals - 02/15/20 1716      PT SHORT TERM GOAL #1   Title independent with initial HEP to manage pain with correct body mechanics with less stress on the pubic symphysis and engaging the abdominals    Baseline not educated yet    Time 4    Period Weeks    Status New    Target Date 03/14/20             PT Long Term Goals - 02/15/20 1717      PT LONG TERM GOAL #1   Title independent with her advanced HEP for pelvic stability    Baseline not educated yet    Time 12    Period Weeks    Status New    Target Date 05/09/20      PT LONG TERM GOAL #2   Title  able to sit for 45 mintues with pain decreased </= 2/10 due ot her pelvis in correct alignment    Baseline righ tilium is rotated anteriorly and pubic symphysis is deeper than left    Time 12    Period Weeks    Status New    Target Date 05/09/20      PT LONG TERM GOAL #3   Title able to stand for 10 minutes with  pain level decreased </= 2/10 due to increased in hip strength >/= 4+/5    Baseline pain level is 7/10 and hip strength averages 3-4/5    Time 12    Period Weeks    Status New    Target Date 05/09/20      PT LONG TERM GOAL #4   Title able to lift her baby due to increased in core and pelvic strength and using correct body mechanics    Baseline not educated yet    Time 12    Period Weeks    Status New    Target Date 05/09/20                  Plan - 02/15/20 1558    Clinical Impression Statement Patient is a 30 year old with pelvic pain that started 09/2019 while she was in her third trimester of her pregnancy. Patient gave birth vaginally on 10/25/2019 wiht no complications. Patient reports her pelvic pain is constant at level 7/10 and wil lintensify with sitting and standing too long, bending, lifting, and rolling in bed. Right ilium is anteriorly rotated and shallow. the pubic bone is tender and right side is shallow. Decreased movement of the L4-L5 and tightness in the thoracic lumbar junction. Patient has tenderness located in the righ tlower quadrant, hip adductors,  ischiocavernosus, and levator ani. Patient does not have a Diastasis Recti and able to engage her abdominals with tactile cues. Patient lumbar extension and right rotation is limited by 25%. Patient has a positive SI compression test on the right with pain and bilateral ASIS are tender. Patient deferred for internal assessment for the pelvic floor due to bleeding since her delivery. Patient will benefit from skilled training to reduce her pain, stabilize the pelvis, and improve her function.    Personal Factors and Comorbidities Time since onset of injury/illness/exacerbation;Fitness    Examination-Activity Limitations Locomotion Level;Lift;Caring for Others;Bend;Squat;Bed Mobility    Examination-Participation Restrictions Cleaning;Community Activity    Stability/Clinical Decision Making Stable/Uncomplicated    Clinical  Decision Making Low    Rehab Potential Excellent    PT Frequency 1x / week    PT Duration 12 weeks    PT Treatment/Interventions ADLs/Self Care Home Management;Cryotherapy;Electrical Stimulation;Iontophoresis 4mg /ml Dexamethasone;Moist Heat;Ultrasound;Therapeutic activities;Therapeutic exercise;Manual techniques;Patient/family education;Neuromuscular re-education;Dry needling;Energy conservation;Taping;Spinal Manipulations;Joint Manipulations    PT Next Visit Plan correct pelvic alignment; electrical stimulation and heat for pain; manual work to the hip adductors, abdominal contraction; prone gluteal squeeze, body mechanics ot redcue strain on pubic bone; manual work to right lower quadrant; assess pelvic floor is patient agrees; See what Dr. Tildon Husky says    PT Home Exercise Plan Access Code: [email or web address]    Consulted and Agree with Plan of Care Patient           Patient will benefit from skilled therapeutic intervention in order to improve the following deficits and impairments:  Pain,Increased fascial restricitons,Decreased activity tolerance,Increased muscle spasms,Decreased range of motion,Decreased strength  Visit Diagnosis: Muscle weakness (generalized) - Plan: PT plan of care cert/re-cert  Pelvic pain -  Plan: PT plan of care cert/re-cert  Cramp and spasm - Plan: PT plan of care cert/re-cert     Problem List Patient Active Problem List   Diagnosis Date Noted  . 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 BMI 27.0-27.9,adult 03/11/2018    Eulis Foster, PT 02/15/20 5:22 PM   [facility] [address] [place], [place], [address] Phone: [phone]   Fax:  [phone]  Name: Chelsea Wallace MRN: [id number] Date of Birth: Dec 11, 1990

## 2020-02-15 NOTE — Patient Instructions (Signed)
Access Code: [email or web address] URL: [email or web address] Date: 02/15/2020 Prepared by: Eulis Foster  Program Notes massage the inner thighs for 2 minutes daily   Exercises Supine Transversus Abdominis Bracing - Hands on Stomach - 1 x daily - 7 x weekly - 1 sets - 10 reps - 5 sec hold Eulis Foster, PT [facility] [address] [place], [place] [address] W: [phone] [email or web address]

## 2020-02-24 NOTE — Progress Notes (Signed)
[facility] New Patient Evaluation and Consultation  Referring Provider: Brock Bad, MD PCP: Patient, No Pcp Per Date of Service: 02/29/2020  SUBJECTIVE Chief Complaint: New Patient (Initial Visit) (Chelsea Wallace is a 30 y.o. female complains of pelvic and back pain. Pt also has leaking 5-7x per day)  History of Present Illness: Chelsea Wallace is a 30 y.o. Black or African-American female seen in consultation at the request of Dr. Clearance Coots for evaluation of pelvic pain.    Review of records in Epic significant for: Has had pelvic pain since third trimester of pregnancy, delivered by NSVD (with shoulder dystocia) on 10/25/19 with no lacerations.   Negative gonorrhea/ chlamydia on 12/16/19  TVUS performed 12/07/19: normal findings, IUD in place.   Has started pelvic floor physical therapy- right ilium is anteriorly rotated, decreased spinal movement.   Urinary Symptoms: Leaks urine with exercise and going from sitting to standing. Started recently- mostly with moving around, not with urgency.  Leaks 5-7 time(s) per days.  Pad use: 8-12 pads per day.   Chelsea Wallace is bothered by her UI symptoms.  Day time voids 8-12.  Nocturia: 1-2 times per night to void. Voiding dysfunction: Chelsea Wallace empties her bladder well.  does not use a catheter to empty bladder.  When urinating, Chelsea Wallace feels Chelsea Wallace has no difficulties   UTIs: 0 UTI's in the last year.   Denies history of blood in urine and kidney or bladder stones  Pelvic Organ Prolapse Symptoms:                  Chelsea Wallace Denies a feeling of a bulge the vaginal area.   Bowel Symptom: Bowel movements: 1 time(s) per day Stool consistency: soft  Straining: no.  Splinting: no.  Incomplete evacuation: no.  Chelsea Wallace Denies accidental bowel leakage / fecal incontinence Bowel regimen: none Last colonoscopy: n/a  Sexual Function Sexually active: no- not currently   Pelvic Pain Admits to pelvic pain. Pain is constant. Started with 3rd  trimester of pregnancy. Location: the entire pelvic area Pain occurs: all the time Prior pain treatment: tylenol, ibuprofen, oxycodone- none helped with the pain Improved by: laying on the back Worsened by: laying, sitting or standing too long.  Started physical therapy with Eulis Foster.   Past Medical History:  Past Medical History:  Diagnosis Date  . GERD (gastroesophageal reflux disease)    on prescription for it; pt cannot remember name of med.     Past Surgical History:   Past Surgical History:  Procedure Laterality Date  . MANDIBLE RECONSTRUCTION     lower jaw surgery 2011  . MANDIBLE SURGERY Bilateral    age 7 or 84, manibles fx to adjust bite     Past OB/GYN History: G4 P4 Vaginal deliveries: 4,  Forceps/ Vacuum deliveries: 0, Cesarean section: 0 Contraception: IUD. Last pap smear was 04/09/19.  Any history of abnormal pap smears: no.   Medications: Chelsea Wallace has a current medication list which includes the following prescription(s): cyclobenzaprine, prenate mini, acetaminophen, ibuprofen, and oxycodone.   Allergies: Patient has No Known Allergies.   Social History:  Social History   Tobacco Use  . Smoking status: Never Smoker  . Smokeless tobacco: Never Used  Vaping Use  . Vaping Use: Never used  Substance Use Topics  . Alcohol use: No  . 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 Diabetes Mother   . Diabetes Maternal Grandfather   . Hypertension Paternal Grandmother   . Hypertension Paternal Grandfather      Review of Systems: Review of Systems  Constitutional: Negative for fever, malaise/fatigue and weight loss.  Respiratory: Negative for cough, shortness of breath and wheezing.   Cardiovascular: Negative for chest pain, palpitations and leg swelling.   Gastrointestinal: Negative for abdominal pain and blood in stool.  Genitourinary: Negative for dysuria.  Musculoskeletal: Negative for myalgias.  Skin: Negative for rash.  Neurological: Negative for dizziness and headaches.  Endo/Heme/Allergies: Does not bruise/bleed easily.  Psychiatric/Behavioral: Negative for depression. The patient is not nervous/anxious.      OBJECTIVE Physical Exam: Vitals:   02/29/20 0839  BP: 130/89  Pulse: 79  Weight: 190 lb (86.2 kg)  Height: 5\' 5"  (1.651 m)    Physical Exam Constitutional:      General: Chelsea Wallace is not in acute distress. Pulmonary:     Effort: Pulmonary effort is normal.  Abdominal:     General: There is no distension.     Palpations: Abdomen is soft.     Tenderness: There is no abdominal tenderness. There is no rebound.  Musculoskeletal:        General: No swelling. Normal range of motion.  Skin:    General: Skin is warm and dry.     Findings: No rash.  Neurological:     Mental Status: Chelsea Wallace is alert and oriented to person, place, and time.  Psychiatric:        Mood and Affect: Mood normal.        Behavior: Behavior normal.     GU / Detailed Urogynecologic Evaluation:  Pelvic Exam: Normal external female genitalia; Bartholin's and Skene's glands normal in appearance; urethral meatus normal in appearance, no urethral masses or discharge.   CST: negative  Q-tip test: no allodynia at introitus  Speculum exam reveals normal vaginal mucosa without atrophy. Cervix normal appearance. Uterus normal single, nontender. Adnexa no mass, fullness, tenderness.    Pelvic floor strength I/V  Pelvic floor musculature: Right levator tender, Right obturator tender, Left levator tender, Left obturator tender  POP-Q:   POP-Q  -2                                            Aa   -2                                           Ba  -8                                              C   3                                            Gh  3  Pb  9                                            tvl   -2                                            Ap  -2                                            Bp  -8.5                                              D     Rectal Exam:  Normal external rectum  Post-Void Residual (PVR) by Bladder Scan: In order to evaluate bladder emptying, we discussed obtaining a postvoid residual and Chelsea Wallace agreed to this procedure.  Procedure: The ultrasound unit was placed on the patient's abdomen in the suprapubic region after the patient had voided. A PVR of 2 ml was obtained by bladder scan.  Laboratory Results: POC urine: negative  I visualized the urine specimen, noting the specimen to be dark yellow  ASSESSMENT AND PLAN Chelsea Wallace is a 30 y.o. with:  1. Levator spasm   2. Frequent urination   3. SUI (stress urinary incontinence, female)    1. Levator spasm - Likely exacerbated by rotated ilium and decreased spinal movement identified by physical therapy - The origin of pelvic floor muscle spasm can be multifactorial, including primary, reactive to a different pain source, trauma, or even part of a centralized pain syndrome.Treatment options include pelvic floor physical therapy, local (vaginal) or oral  muscle relaxants, pelvic muscle trigger point injections or centrally acting pain medications.   - Chelsea Wallace is interested in using a muscle relaxant. Chelsea Wallace is currently breastfeeding. Will start Flexeril, which has little affect on infants who are breastfed according to [employer]. Flexeril 10mg  tab up to two times per day prescribed. Chelsea Wallace should take nightly for a week then as needed after.  - Chelsea Wallace is also continuing with physical therapy.  - Currently out of work on disability. We will see how this first week of flexeril improves her pain. Chelsea Wallace is unable to sit for long periods of time.   2. Frequency - No UTI noted on POC urine today  3. SUI For treatment of stress  urinary incontinence,  non-surgical options include expectant management, weight loss, physical therapy, as well as a pessary and surgical options.  - Since pain is her main symptom, would like to see improvement of her pain/ levator spasm prior to initiating further treatment. Discussed that PT can help improve these symptoms.   Return 2-3 months for follow up of symptoms or sooner if needed.   , MD   Medical Decision Making:  - Reviewed/ ordered a clinical laboratory test - Review and summation of prior records

## 2020-02-29 ENCOUNTER — Other Ambulatory Visit: Payer: Self-pay

## 2020-02-29 ENCOUNTER — Ambulatory Visit (INDEPENDENT_AMBULATORY_CARE_PROVIDER_SITE_OTHER): Payer: Medicaid Other | Admitting: Obstetrics and Gynecology

## 2020-02-29 ENCOUNTER — Encounter: Payer: Self-pay | Admitting: Obstetrics and Gynecology

## 2020-02-29 VITALS — BP 130/89 | HR 79 | Ht 65.0 in | Wt 190.0 lb

## 2020-02-29 DIAGNOSIS — M62838 Other muscle spasm: Secondary | ICD-10-CM | POA: Diagnosis not present

## 2020-02-29 DIAGNOSIS — N393 Stress incontinence (female) (male): Secondary | ICD-10-CM

## 2020-02-29 DIAGNOSIS — R350 Frequency of micturition: Secondary | ICD-10-CM | POA: Diagnosis not present

## 2020-02-29 LAB — POCT URINALYSIS DIPSTICK
Appearance: NORMAL
Bilirubin, UA: NEGATIVE
Blood, UA: NEGATIVE
Glucose, UA: NEGATIVE
Ketones, UA: NEGATIVE
Leukocytes, UA: NEGATIVE
Nitrite, UA: NEGATIVE
Protein, UA: NEGATIVE
Spec Grav, UA: 1.025 (ref 1.010–1.025)
Urobilinogen, UA: 0.2 E.U./dL
pH, UA: 5 (ref 5.0–8.0)

## 2020-02-29 MED ORDER — CYCLOBENZAPRINE HCL 10 MG PO TABS: 10.0000 mg | ORAL_TABLET | Freq: Two times a day (BID) | ORAL | 0 refills | Status: DC | PRN

## 2020-02-29 NOTE — Patient Instructions (Signed)
Take flexeril up to twice a day as needed for muscle relaxant.

## 2020-03-07 ENCOUNTER — Other Ambulatory Visit: Payer: Self-pay

## 2020-03-07 ENCOUNTER — Encounter: Payer: Self-pay | Admitting: Physical Therapy

## 2020-03-07 ENCOUNTER — Encounter: Payer: Medicaid Other | Admitting: Physical Therapy

## 2020-03-07 DIAGNOSIS — R252 Cramp and spasm: Secondary | ICD-10-CM

## 2020-03-07 DIAGNOSIS — R102 Pelvic and perineal pain: Secondary | ICD-10-CM

## 2020-03-07 DIAGNOSIS — M6281 Muscle weakness (generalized): Secondary | ICD-10-CM | POA: Diagnosis not present

## 2020-03-07 NOTE — Therapy (Signed)
[facility] [address], [address] [place], [place], [address] Phone: [phone]   Fax:  [phone]  Physical Therapy Treatment  Patient Details  Name: Chelsea Wallace MRN: [id number] Date of Birth: Sep 20, 1990 Referring Provider (PT): Sharen Counter CNM   Encounter Date: 03/07/2020   PT End of Session - 03/07/20 1022    Visit Number 2    Date for PT Re-Evaluation 05/09/20    Authorization Type Medicaid Healthy blue    Authorization Time Period 2/3-4/25    Authorization - Visit Number 1    Authorization - Number of Visits 12    PT Start Time 0940    PT Stop Time 1030    PT Time Calculation (min) 50 min    Activity Tolerance Patient tolerated treatment well;Patient limited by pain;No increased pain    Behavior During Therapy WFL for tasks assessed/performed           Past Medical History:  Diagnosis Date  . GERD (gastroesophageal reflux disease)    on prescription for it; pt cannot remember name of med.    Past Surgical History:  Procedure Laterality Date  . MANDIBLE RECONSTRUCTION     lower jaw surgery 2011  . MANDIBLE SURGERY Bilateral    age 30 or 30, manibles fx to adjust bite    There were no vitals filed for this visit.   Subjective Assessment - 03/07/20 0942    Subjective I have been doing the exercises but no change in pain. I tried the pain medication the doctor gave me but my milk stopped.    Patient Stated Goals stop the pain    Currently in Pain? Yes    Pain Score 8     Pain Location Pelvis    Pain Orientation Right;Left;Lower    Pain Descriptors / Indicators Aching;Contraction;Sharp;Shooting;Other (Comment)   popping   Pain Onset More than a month ago    Pain Frequency Constant    Aggravating Factors  turning in bed, bending, lifting, standing and sitting too long, lay on back and has back pain    Pain Relieving Factors laying on back or side    Multiple Pain Sites No                              [facility] Adult PT Treatment/Exercise - 03/07/20 0001      Lumbar Exercises: Quadruped   Madcat/Old Horse 15 reps    Madcat/Old Horse Limitations then move hips side to side    Other Quadruped Lumbar Exercises hip hinging to prayer stetch to quadruped      Modalities   Modalities Electrical Stimulation;Moist Heat      Moist Heat Therapy   Number Minutes Moist Heat 15 Minutes    Moist Heat Location Lumbar Spine   suprapubically     Electrical Stimulation   Electrical Stimulation Location lumbar and suprapubically    Electrical Stimulation Action IFC    Electrical Stimulation Parameters to patient tolerance, 15 minutes    Electrical Stimulation Goals Pain;Tone      Manual Therapy   Manual Therapy Joint mobilization;Muscle Energy Technique;Soft tissue mobilization    Manual therapy comments to assess for dry needling    Joint Mobilization sacral mobilization to sacrum to correct left rotation, PA and rotational mobilization to L1-L5 then side glide to the same    Soft tissue mobilization left hip adductor and right iliopsopas to elongate muscle after  dry needling    Muscle Energy Technique correct right ilium            Trigger Point Dry Needling - 03/07/20 0001    Consent Given? Yes    Education Handout Provided Yes    Muscles Treated Lower Quadrant Adductor longus/brevis/magnus   left   Muscles Treated Back/Hip Iliopsoas   right   Adductor Response Twitch response elicited;Palpable increased muscle length    Iliopsoas Response Twitch response elicited;Palpable increased muscle length                PT Education - 03/07/20 1022    Education Details information on dry needling    Person(s) Educated Patient    Methods Explanation;Handout    Comprehension Verbalized understanding            PT Short Term Goals - 02/15/20 1716      PT SHORT TERM GOAL #1   Title independent with initial HEP to manage pain with correct body  mechanics with less stress on the pubic symphysis and engaging the abdominals    Baseline not educated yet    Time 4    Period Weeks    Status New    Target Date 03/14/20             PT Long Term Goals - 02/15/20 1717      PT LONG TERM GOAL #1   Title independent with her advanced HEP for pelvic stability    Baseline not educated yet    Time 12    Period Weeks    Status New    Target Date 05/09/20      PT LONG TERM GOAL #2   Title able to sit for 45 mintues with pain decreased </= 2/10 due ot her pelvis in correct alignment    Baseline righ tilium is rotated anteriorly and pubic symphysis is deeper than left    Time 12    Period Weeks    Status New    Target Date 05/09/20      PT LONG TERM GOAL #3   Title able to stand for 10 minutes with pain level decreased </= 2/10 due to increased in hip strength >/= 4+/5    Baseline pain level is 7/10 and hip strength averages 3-4/5    Time 12    Period Weeks    Status New    Target Date 05/09/20      PT LONG TERM GOAL #4   Title able to lift her baby due to increased in core and pelvic strength and using correct body mechanics    Baseline not educated yet    Time 12    Period Weeks    Status New    Target Date 05/09/20                 Plan - 03/07/20 1023    Clinical Impression Statement Patient pelvis in correct alignment after therapy. She responded well to the dry needling and the left hip adductor and right iliopsoas had reduction of trigger points. Patient had improved lumbar mobilizty after mobilization. when therapist did side glide to T12-L1 she had  referred pain into the pelvis. Patient has not had internal assessment but will be done next visit. Patient has not achieved goals at this time due to just starting therapy. Patient will benefit from skilled therapy to reduce her pain, stabilize the pelvis, and improve her function.    Personal Factors and Comorbidities Time since  onset of  injury/illness/exacerbation;Fitness    Examination-Activity Limitations Locomotion Level;Lift;Caring for Others;Bend;Squat;Bed Mobility    Examination-Participation Restrictions Cleaning;Community Activity    Stability/Clinical Decision Making Stable/Uncomplicated    Rehab Potential Excellent    PT Frequency 1x / week    PT Duration 12 weeks    PT Treatment/Interventions ADLs/Self Care Home Management;Cryotherapy;Electrical Stimulation;Iontophoresis 4mg /ml Dexamethasone;Moist Heat;Ultrasound;Therapeutic activities;Therapeutic exercise;Manual techniques;Patient/family education;Neuromuscular re-education;Dry needling;Energy conservation;Taping;Spinal Manipulations;Joint Manipulations    PT Next Visit Plan electrical stimulation and heat for pain; manual work to the hip adductors, abdominal contraction; prone gluteal squeeze, body mechanics ot redcue strain on pubic bone; manual work to right lower quadrant; assess pelvic floor is patient agrees;dry needle lumbar and TL junction    PT Home Exercise Plan Access Code: [email or web address]    Recommended Other Services MD signed initial note    Consulted and Agree with Plan of Care Patient           Patient will benefit from skilled therapeutic intervention in order to improve the following deficits and impairments:  Pain,Increased fascial restricitons,Decreased activity tolerance,Increased muscle spasms,Decreased range of motion,Decreased strength  Visit Diagnosis: Muscle weakness (generalized)  Pelvic pain  Cramp and spasm     Problem List Patient Active Problem List   Diagnosis Date Noted  . 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 BMI 27.0-27.9,adult 03/11/2018    03/13/2018, PT 03/07/20 10:28 AM   [place] [address] [place], [place], [place] Phone: [phone]   Fax:  [phone]  Name: Chelsea Wallace MRN: [name] Date of Birth: [birth year]

## 2020-03-07 NOTE — Patient Instructions (Addendum)
Trigger Point Dry Needling  . What is Trigger Point Dry Needling (DN)? o DN is a physical therapy technique used to treat muscle pain and dysfunction. Specifically, DN helps deactivate muscle trigger points (muscle knots).  o A thin filiform needle is used to penetrate the skin and stimulate the underlying trigger point. The goal is for a local twitch response (LTR) to occur and for the trigger point to relax. No medication of any kind is injected during the procedure.   . What Does Trigger Point Dry Needling Feel Like?  o The procedure feels different for each individual patient. Some patients report that they do not actually feel the needle enter the skin and overall the process is not painful. Very mild bleeding may occur. However, many patients feel a deep cramping in the muscle in which the needle was inserted. This is the local twitch response.   Marland Kitchen How Will I feel after the treatment? o Soreness is normal, and the onset of soreness may not occur for a few hours. Typically this soreness does not last longer than two days.  o Bruising is uncommon, however; ice can be used to decrease any possible bruising.  o In rare cases feeling tired or nauseous after the treatment is normal. In addition, your symptoms may get worse before they get better, this period will typically not last longer than 24 hours.   . What Can I do After My Treatment? o Increase your hydration by drinking more water for the next 24 hours. o You may place ice or heat on the areas treated that have become sore, however, do not use heat on inflamed or bruised areas. Heat often brings more relief post needling. o You can continue your regular activities, but vigorous activity is not recommended initially after the treatment for 24 hours. o DN is best combined with other physical therapy such as strengthening, stretching, and other therapies.    Eulis Foster, PT [facility] [address] [place], [place] [address] W: [phone] [email or web address]

## 2020-03-09 ENCOUNTER — Encounter: Payer: Self-pay | Admitting: Obstetrics

## 2020-03-09 ENCOUNTER — Telehealth (INDEPENDENT_AMBULATORY_CARE_PROVIDER_SITE_OTHER): Payer: Medicaid Other | Admitting: Obstetrics

## 2020-03-09 DIAGNOSIS — G8929 Other chronic pain: Secondary | ICD-10-CM | POA: Diagnosis not present

## 2020-03-09 DIAGNOSIS — R102 Pelvic and perineal pain: Secondary | ICD-10-CM | POA: Diagnosis not present

## 2020-03-09 NOTE — Progress Notes (Signed)
MyChart GYN for FU.  C/o She is having the same problem, nothing has changed.

## 2020-03-09 NOTE — Progress Notes (Signed)
    GYNECOLOGY VIRTUAL VISIT ENCOUNTER NOTE  Provider location: Center for [facility] [place]   I connected with Chelsea Wallace on 03/09/20 at  2:00 PM EST by MyChart Video Encounter at home and verified that I am speaking with the correct person using two identifiers.   I discussed the limitations, risks, security and privacy concerns of performing an evaluation and management service virtually and the availability of in person appointments. I also discussed with the patient that there may be a patient responsible charge related to this service. The patient expressed understanding and agreed to proceed.   History:  Chelsea Wallace is a 30 y.o. [phone] female being evaluated today for chronic pelvic pain. She denies any abnormal vaginal discharge, bleeding, or other concerns.       Past Medical History:  Diagnosis Date  . GERD (gastroesophageal reflux disease)    on prescription for it; pt cannot remember name of med.   Past Surgical History:  Procedure Laterality Date  . MANDIBLE RECONSTRUCTION     lower jaw surgery 2011  . MANDIBLE SURGERY Bilateral    age 30 or 30, manibles fx to adjust bite   The following portions of the patient's history were reviewed and updated as appropriate: allergies, current medications, past family history, past medical history, past social history, past surgical history and problem list.   Health Maintenance:  Normal pap and negative HRHPV on 01-28-2018.   Review of Systems:  Pertinent items noted in HPI and remainder of comprehensive ROS otherwise negative.  Physical Exam:   General:  Alert, oriented and cooperative. Patient appears to be in no acute distress.  Mental Status: Normal mood and affect. Normal behavior. Normal judgment and thought content.   Respiratory: Normal respiratory effort, no problems with respiration noted  Rest of physical exam deferred due to type of encounter  Labs and Imaging Results for orders placed or  performed in visit on 02/29/20 (from the past 336 hour(s))  POCT Urinalysis Dipstick   Collection Time: 02/29/20  8:53 AM  Result Value Ref Range   Color, UA Yellow    Clarity, UA clear    Glucose, UA Negative Negative   Bilirubin, UA Negative    Ketones, UA Negative    Spec Grav, UA 1.025 1.010 - 1.025   Blood, UA Negative    pH, UA 5.0 5.0 - 8.0   Protein, UA Negative Negative   Urobilinogen, UA 0.2 0.2 or 1.0 E.U./dL   Nitrite, UA Negative    Leukocytes, UA Negative Negative   Appearance Normal    Odor None    No results found.     Assessment and Plan:     1. Chronic pelvic pain in female - has been seeing UroGyn and PT with very little progress at this point - recommend continuing current treatment plan - follow up prn      I discussed the assessment and treatment plan with the patient. The patient was provided an opportunity to ask questions and all were answered. The patient agreed with the plan and demonstrated an understanding of the instructions.   The patient was advised to call back or seek an in-person evaluation/go to the ED if the symptoms worsen or if the condition fails to improve as anticipated.  I provided 15 minutes of face-to-face time during this encounter.   Coral Ceo, MD Center for [facility], [facility] 03/09/20

## 2020-03-14 ENCOUNTER — Encounter: Payer: Medicaid Other | Admitting: Physical Therapy

## 2020-03-15 ENCOUNTER — Ambulatory Visit: Payer: Medicaid Other | Attending: Advanced Practice Midwife | Admitting: Physical Therapy

## 2020-03-15 ENCOUNTER — Other Ambulatory Visit: Payer: Self-pay

## 2020-03-15 ENCOUNTER — Encounter: Payer: Self-pay | Admitting: Physical Therapy

## 2020-03-15 DIAGNOSIS — M6281 Muscle weakness (generalized): Secondary | ICD-10-CM | POA: Diagnosis present

## 2020-03-15 DIAGNOSIS — R252 Cramp and spasm: Secondary | ICD-10-CM | POA: Insufficient documentation

## 2020-03-15 DIAGNOSIS — R102 Pelvic and perineal pain: Secondary | ICD-10-CM | POA: Insufficient documentation

## 2020-03-15 NOTE — Therapy (Signed)
[facility] [address], [address] [place], [place], [address] Phone: [phone]   Fax:  [phone]  Physical Therapy Treatment  Patient Details  Name: Chelsea Wallace MRN: [id number] Date of Birth: 05/24/1990 Referring Provider (PT): Sharen Counter CNM   Encounter Date: 03/15/2020   PT End of Session - 03/15/20 1628    Visit Number 3    Date for PT Re-Evaluation 05/09/20    Authorization Type Medicaid Healthy blue    Authorization Time Period 2/3-4/25    Authorization - Visit Number 2    Authorization - Number of Visits 12    PT Start Time 1615    PT Stop Time 1700    PT Time Calculation (min) 45 min    Activity Tolerance Patient tolerated treatment well;Patient limited by pain;No increased pain    Behavior During Therapy WFL for tasks assessed/performed           Past Medical History:  Diagnosis Date  . GERD (gastroesophageal reflux disease)    on prescription for it; pt cannot remember name of med.    Past Surgical History:  Procedure Laterality Date  . MANDIBLE RECONSTRUCTION     lower jaw surgery 2011  . MANDIBLE SURGERY Bilateral    age 30 or 89, manibles fx to adjust bite    There were no vitals filed for this visit.   Subjective Assessment - 03/15/20 1620    Subjective I have been in increased pain since last night. The therapy has helped for several hours .    Patient Stated Goals stop the pain    Currently in Pain? Yes    Pain Score 6     Pain Location Pelvis    Pain Orientation Right;Left;Lower    Pain Descriptors / Indicators Aching;Contraction;Sharp;Shooting   popping   Pain Type Acute pain    Pain Onset More than a month ago    Pain Frequency Constant    Aggravating Factors  turning in bed, bending, lifting, standing and sitting  too long, lay on back and has back pain    Pain Relieving Factors laying on back or side    Multiple Pain Sites No                           Pelvic Floor Special Questions - 03/15/20 0001    Pelvic Floor Internal Exam Patient confirms identification and approves PT to assess pelvic floor and treatment    Exam Type Rectal    Palpation tenderness located in the puborectalis and ilioccygeus;             [facility] Adult PT Treatment/Exercise - 03/15/20 0001      Lumbar Exercises: Stretches   Other Lumbar Stretch Exercise showed patient how to use a tennis ball to massage her hamstings, hip adductors, quadricep and gluteal muscles; unable to use the tennis ball on the pelvic floor due to increase pain      Lumbar Exercises: Aerobic   Recumbent Bike level 1 for 4 minutes while assessing patient.      Manual Therapy   Manual Therapy Soft tissue mobilization;Internal Pelvic Floor    Manual therapy comments to assess for dry needling    Soft tissue mobilization to bilateral gluteals, piriformis, along the levator ani and along the pubic rami    Internal Pelvic Floor to right illiococcygeus and puborectalis with stroking method but then pain increased to 6/10 and therapist stopped  Trigger Point Dry Needling - 03/15/20 0001    Consent Given? Yes    Education Handout Provided Previously provided    Other Dry Needling puborectalis, illioccygeus   twitch response ilicitated; elongation of muscle palpated               PT Education - 03/15/20 1709    Education Details educated patient on how to use a tennis ball to massage the hamstring, hip adductors, and gluteals    Person(s) Educated Patient    Methods Explanation;Demonstration    Comprehension Verbalized understanding;Returned demonstration            PT Short Term Goals - 03/15/20 1713      PT SHORT TERM GOAL #1   Title independent with initial HEP to manage pain with correct body mechanics with less stress on the pubic symphysis and engaging the abdominals    Time 4    Period Weeks    Status On-going              PT Long Term Goals - 02/15/20 1717      PT LONG TERM GOAL #1   Title independent with her advanced HEP for pelvic stability    Baseline not educated yet    Time 12    Period Weeks    Status New    Target Date 05/09/20      PT LONG TERM GOAL #2   Title able to sit for 45 mintues with pain decreased </= 2/10 due ot her pelvis in correct alignment    Baseline righ tilium is rotated anteriorly and pubic symphysis is deeper than left    Time 12    Period Weeks    Status New    Target Date 05/09/20      PT LONG TERM GOAL #3   Title able to stand for 10 minutes with pain level decreased </= 2/10 due to increased in hip strength >/= 4+/5    Baseline pain level is 7/10 and hip strength averages 3-4/5    Time 12    Period Weeks    Status New    Target Date 05/09/20      PT LONG TERM GOAL #4   Title able to lift her baby due to increased in core and pelvic strength and using correct body mechanics    Baseline not educated yet    Time 12    Period Weeks    Status New    Target Date 05/09/20                 Plan - 03/15/20 1710    Clinical Impression Statement Patient pain level decreased to 4/10 after manual work. She was not able to tolerate internal work anally due to pain level increased to 6/10. Patient responds well to dry needling. She was not able to sit with her back on the back rest while doing the nustep due to pain. Patient is progressing slowly. Last visit she has several hours of less pain for the first time. Patient will benefit from skilled therapy to reduce her pain, stabilize her pelvis and improve her function.    Personal Factors and Comorbidities Time since onset of injury/illness/exacerbation;Fitness    Examination-Activity Limitations Locomotion Level;Lift;Caring for Others;Bend;Squat;Bed Mobility    Examination-Participation Restrictions Cleaning;Community Activity    Stability/Clinical Decision Making Stable/Uncomplicated    Rehab Potential  Excellent    PT Frequency 1x / week    PT Duration 12 weeks  PT Treatment/Interventions ADLs/Self Care Home Management;Cryotherapy;Electrical Stimulation;Iontophoresis 4mg /ml Dexamethasone;Moist Heat;Ultrasound;Therapeutic activities;Therapeutic exercise;Manual techniques;Patient/family education;Neuromuscular re-education;Dry needling;Energy conservation;Taping;Spinal Manipulations;Joint Manipulations    PT Next Visit Plan dry needling to the right pelvic floor, manual work, go over stretches, body mechanics to reduce the pain with activities    PT Home Exercise Plan Access Code: [email or web address]    Consulted and Agree with Plan of Care Patient           Patient will benefit from skilled therapeutic intervention in order to improve the following deficits and impairments:  Pain,Increased fascial restricitons,Decreased activity tolerance,Increased muscle spasms,Decreased range of motion,Decreased strength  Visit Diagnosis: Muscle weakness (generalized)  Pelvic pain  Cramp and spasm     Problem List Patient Active Problem List   Diagnosis Date Noted  . 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 BMI 27.0-27.9,adult 03/11/2018    03/13/2018, PT 03/15/20 5:14 PM   [facility] [address] [place], [place], [place] Phone: [phone]   Fax:  [phone]  Name: Chelsea Wallace MRN: [name] Date of Birth: October 16, 1990

## 2020-03-21 ENCOUNTER — Other Ambulatory Visit: Payer: Self-pay

## 2020-03-21 ENCOUNTER — Encounter: Payer: Self-pay | Admitting: Physical Therapy

## 2020-03-21 ENCOUNTER — Encounter: Payer: Medicaid Other | Attending: Advanced Practice Midwife | Admitting: Physical Therapy

## 2020-03-21 DIAGNOSIS — R102 Pelvic and perineal pain: Secondary | ICD-10-CM | POA: Insufficient documentation

## 2020-03-21 DIAGNOSIS — M6281 Muscle weakness (generalized): Secondary | ICD-10-CM | POA: Insufficient documentation

## 2020-03-21 DIAGNOSIS — R252 Cramp and spasm: Secondary | ICD-10-CM | POA: Insufficient documentation

## 2020-03-21 NOTE — Therapy (Signed)
[facility] at [facility] [address], [address] [place], [place], [address] Phone: [phone]   Fax:  [phone]  Physical Therapy Treatment  Patient Details  Name: Chelsea Wallace MRN: [id number] Date of Birth: [birth year] Referring Provider (PT): Sharen Counter CNM   Encounter Date: 03/21/2020   PT End of Session - 03/21/20 1554    Visit Number 4    Date for PT Re-Evaluation 05/09/20    Authorization Type Medicaid Healthy blue    Authorization Time Period 2/3-4/25    Authorization - Visit Number 3    Authorization - Number of Visits 12    PT Start Time 1432    PT Stop Time 1553    PT Time Calculation (min) 81 min    Activity Tolerance Patient tolerated treatment well;Patient limited by pain    Behavior During Therapy [facility] for tasks assessed/performed           Past Medical History:  Diagnosis Date  . GERD (gastroesophageal reflux disease)    on prescription for it; pt cannot remember name of med.    Past Surgical History:  Procedure Laterality Date  . MANDIBLE RECONSTRUCTION     lower jaw surgery 2011  . MANDIBLE SURGERY Bilateral    age 18 or 62, manibles fx to adjust bite    There were no vitals filed for this visit.   Subjective Assessment - 03/21/20 1444    Subjective I have felt better till the evening on the day I had treatment. After that the pain kept on increasing. This weekend she had her grandma and dad help with the kids due to being in too much pain.    Patient Stated Goals stop the pain    Currently in Pain? Yes    Pain Score 9     Pain Location Pelvis    Pain Orientation Right;Left;Lower    Pain Descriptors / Indicators Aching;Contraction;Sharp;Shooting;Other (Comment)   popping   Pain Type Acute pain    Pain Onset More than a month ago    Pain Frequency Constant    Aggravating Factors  turning in bed, bending, lifting, standing and sitting too long, lay on back and has back pain    Pain  Relieving Factors laying on back or side    Multiple Pain Sites No                          Pelvic Floor Special Questions - 03/21/20 0001    Pelvic Floor Internal Exam Patient confirms identification and approves PT to assess pelvic floor and treatment    Exam Type Rectal    Palpation tenderness located in the puborectalis and ilioccygeus;             [facility] Adult PT Treatment/Exercise - 03/21/20 0001      Self-Care   Self-Care Other Self-Care Comments    Other Self-Care Comments  instructed patient o put on her brace she had from a past pregnanacy to stabilize the pelvis; educated her on how to massage suprapubically to reduce pain      Neuro Re-ed    Neuro Re-ed Details  diaphragmatic breathing with the soft tissue work to lengthen the pelvic floor and mobilize the coccyx      Lumbar Exercises: Stretches   ITB Stretch Right;Left;1 rep;60 seconds    ITB Stretch Limitations foam roll on side    Piriformis Stretch Right;Left;1 rep;60 seconds    Piriformis Stretch Limitations  sitting on foam roll    Other Lumbar Stretch Exercise sit on foam roll between her legs and massage the pelvic floor    Other Lumbar Stretch Exercise tried foam roll to the hip adductors but increased her pain      Manual Therapy   Manual Therapy Soft tissue mobilization;Myofascial release    Manual therapy comments to assess for dry needling    Myofascial Release fascial release along the suprapubic area and below the umbilicus    Internal Pelvic Floor right puborectalis, illiococcygeus, coccygeus, left pubococcygeus and distractio to the coccyx            Trigger Point Dry Needling - 03/21/20 0001    Consent Given? Yes    Education Handout Provided Previously provided    Other Dry Needling perineal body and right superior transverse   trigger point response, elongation of muscle                 PT Short Term Goals - 03/15/20 1713      PT SHORT TERM GOAL #1   Title  independent with initial HEP to manage pain with correct body mechanics with less stress on the pubic symphysis and engaging the abdominals    Time 4    Period Weeks    Status On-going             PT Long Term Goals - 03/21/20 1601      PT LONG TERM GOAL #1   Title independent with her advanced HEP for pelvic stability    Baseline not educated yet    Time 12    Period Weeks    Status On-going      PT LONG TERM GOAL #2   Title able to sit for 45 mintues with pain decreased </= 2/10 due ot her pelvis in correct alignment    Baseline righ tilium is rotated anteriorly and pubic symphysis is deeper than left    Time 12    Period Weeks    Status On-going      PT LONG TERM GOAL #3   Title able to stand for 10 minutes with pain level decreased </= 2/10 due to increased in hip strength >/= 4+/5    Baseline pain level is 7/10 and hip strength averages 3-4/5    Time 12    Period Weeks    Status On-going      PT LONG TERM GOAL #4   Title able to lift her baby due to increased in core and pelvic strength and using correct body mechanics    Baseline not educated yet    Time 12    Period Weeks    Status On-going                 Plan - 03/21/20 1555    Clinical Impression Statement Patient has less pain when she leaves therapy but by the end of the night the pain will intensify. She had to have help with the chilcren over the weekend due to the pain. Patient was able to tolerate the therapist to work on the pelvic floor muscles anally compared to last time had to stop due to pain increased to 6/10. After the manual work rectally patient had no pain but after doing the foam roll the pain increased. Patient has tenderness located in the suprapubic area and after the manual work there the pain decreased to 5/10. Patient has tenderness along the pubic symphysis. She was instructed to put  her back brace on when she goes home to stabilize the pelvis after treatment. Patient will benefit  from skilled therpay to reduce her pain, stabilize her pelvis and improve her function.    Personal Factors and Comorbidities Time since onset of injury/illness/exacerbation;Fitness    Examination-Activity Limitations Locomotion Level;Lift;Caring for Others;Bend;Squat;Bed Mobility    Examination-Participation Restrictions Cleaning;Community Activity    Stability/Clinical Decision Making Stable/Uncomplicated    Rehab Potential Excellent    PT Frequency 1x / week    PT Duration 12 weeks    PT Treatment/Interventions ADLs/Self Care Home Management;Cryotherapy;Electrical Stimulation;Iontophoresis 4mg /ml Dexamethasone;Moist Heat;Ultrasound;Therapeutic activities;Therapeutic exercise;Manual techniques;Patient/family education;Neuromuscular re-education;Dry needling;Energy conservation;Taping;Spinal Manipulations;Joint Manipulations    PT Next Visit Plan manual work suprapubically; possible rectally, reveiw HEP ; engagement of the abdominals    PT Home Exercise Plan Access Code: [email or web address]    Consulted and Agree with Plan of Care Patient           Patient will benefit from skilled therapeutic intervention in order to improve the following deficits and impairments:  Pain,Increased fascial restricitons,Decreased activity tolerance,Increased muscle spasms,Decreased range of motion,Decreased strength  Visit Diagnosis: Muscle weakness (generalized)  Pelvic pain  Cramp and spasm     Problem List Patient Active Problem List   Diagnosis Date Noted  . 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 BMI 27.0-27.9,adult 03/11/2018    03/13/2018, PT 03/21/20 4:02 PM   [place] [facility] at [facility] [address] [place], [place], [place] Phone: [phone]   Fax:  [phone]  Name: Chelsea Wallace MRN: [name] Date of Birth: Apr 13, 1990

## 2020-03-22 ENCOUNTER — Encounter: Payer: Self-pay | Admitting: Obstetrics and Gynecology

## 2020-03-22 ENCOUNTER — Telehealth: Payer: Self-pay | Admitting: Obstetrics and Gynecology

## 2020-03-22 DIAGNOSIS — M62838 Other muscle spasm: Secondary | ICD-10-CM

## 2020-03-22 MED ORDER — DIAZEPAM 5 MG PO TABS: ORAL_TABLET | ORAL | 0 refills | Status: DC

## 2020-03-22 NOTE — Telephone Encounter (Signed)
Spoke with patient. She is no longer breastfeeding. The flexeril did not help with her pain. Reviewed options for levator spasm including vaginal valium tablets and pelvic floor trigger point injections. She can do one or both together and this can help with progress with physical therapy. She would rather try something at home first before trigger point injections. Prescribed Valium 5mg  tabs to use nightly for two weeks then as needed after. We discussed that valium is a controlled substance so long term use is not advised. She will consider the injections and call if she changes her mind.   She has a follow up in April, will keep as scheduled for now.   May, MD

## 2020-03-22 NOTE — Telephone Encounter (Signed)
-----   Message from Theressa Millard, PT sent at 03/21/2020  2:41 PM EST ----- I have been seeing this patient. This is her 4th treatment. She will feel better for several hours then the pain comes back to a high level. I saw in your notes she may need a muscle relaxer. I was wondering about a vaginal valium to relax the pelvic floor  muscles so we could get them to stay relaxed longer.  Eulis Foster, PT

## 2020-03-23 ENCOUNTER — Encounter: Payer: Self-pay | Admitting: Physical Therapy

## 2020-03-28 ENCOUNTER — Encounter: Payer: Self-pay | Admitting: Physical Therapy

## 2020-03-28 ENCOUNTER — Other Ambulatory Visit: Payer: Self-pay

## 2020-03-28 ENCOUNTER — Encounter: Payer: Medicaid Other | Admitting: Physical Therapy

## 2020-03-28 DIAGNOSIS — M6281 Muscle weakness (generalized): Secondary | ICD-10-CM | POA: Diagnosis present

## 2020-03-28 DIAGNOSIS — R102 Pelvic and perineal pain: Secondary | ICD-10-CM | POA: Diagnosis present

## 2020-03-28 DIAGNOSIS — R252 Cramp and spasm: Secondary | ICD-10-CM

## 2020-03-28 NOTE — Therapy (Signed)
[facility] Outpatient Rehabilitation at [facility] for [facility] [address] [place], [place], [address] Phone: [phone]   Fax:  [phone]  Physical Therapy Treatment  Patient Details  Name: Chelsea Wallace MRN: [id number] Date of Birth: 18-Aug-1990 Referring Provider (PT): Sharen Counter CNM   Encounter Date: 03/28/2020   PT End of Session - 03/28/20 1409    Visit Number 5    Date for PT Re-Evaluation 05/09/20    Authorization Type Medicaid Healthy blue    Authorization Time Period 2/3-4/25    Authorization - Visit Number 4    Authorization - Number of Visits 12    PT Start Time 1408    PT Stop Time 1455    PT Time Calculation (min) 47 min    Activity Tolerance Patient tolerated treatment well;Patient limited by pain    Behavior During Therapy [facility] for tasks assessed/performed           Past Medical History:  Diagnosis Date  . GERD (gastroesophageal reflux disease)    on prescription for it; pt cannot remember name of med.    Past Surgical History:  Procedure Laterality Date  . MANDIBLE RECONSTRUCTION     lower jaw surgery 2011  . MANDIBLE SURGERY Bilateral    age 19 or 44, manibles fx to adjust bite    There were no vitals filed for this visit.   Subjective Assessment - 03/28/20 1409    Subjective Dr. Florian Buff has called me. She gave me the vaginal suppository. It makes her drowsy.I am still bleeding heavily. I called Dr. Clearance Coots office about the bleeding. Only wears a robe at home due to pain. The belt i have at home is fitted when I was pregnant and does not fit anymore.    Patient Stated Goals stop the pain    Currently in Pain? Yes    Pain Score 6     Pain Location Pelvis    Pain Orientation Right;Left    Pain Descriptors / Indicators Aching;Contraction;Sharp;Shooting;Other (Comment)   popping   Pain Type Acute pain    Pain Onset More than a month ago    Pain Frequency Constant    Aggravating Factors  turning in bed, bending,  lifting, standing and sitting too long, lay on back and has back pain    Pain Relieving Factors laying on back or side    Multiple Pain Sites No              [facility] PT Assessment - 03/28/20 0001      Palpation   SI assessment  ASIS is equal                         [facility] Adult PT Treatment/Exercise - 03/28/20 0001      Lumbar Exercises: Supine   Ab Set 10 reps;5 seconds    AB Set Limitations with ball squeeze    Bridge 10 reps    Other Supine Lumbar Exercises abdominal contraction  with alternate shoulder flexion      Manual Therapy   Manual Therapy Soft tissue mobilization;Myofascial release    Soft tissue mobilization manual mobilzation to bilateral thighs and butocks to improve tissue mobility    Myofascial Release using suction cup and iastim to manuever the fascial of the lower abdmen                  PT Education - 03/28/20 1443    Education Details perineal massage you  tube videos;Access Code: [email or web address]    Person(s) Educated Patient    Methods Explanation;Other (comment);Demonstration    Comprehension Verbalized understanding;Returned demonstration            PT Short Term Goals - 03/28/20 1501      PT SHORT TERM GOAL #1   Title independent with initial HEP to manage pain with correct body mechanics with less stress on the pubic symphysis and engaging the abdominals    Time 4    Period Weeks    Status Achieved             PT Long Term Goals - 03/21/20 1601      PT LONG TERM GOAL #1   Title independent with her advanced HEP for pelvic stability    Baseline not educated yet    Time 12    Period Weeks    Status On-going      PT LONG TERM GOAL #2   Title able to sit for 45 mintues with pain decreased </= 2/10 due ot her pelvis in correct alignment    Baseline righ tilium is rotated anteriorly and pubic symphysis is deeper than left    Time 12    Period Weeks    Status On-going      PT LONG TERM GOAL #3   Title able to stand  for 10 minutes with pain level decreased </= 2/10 due to increased in hip strength >/= 4+/5    Baseline pain level is 7/10 and hip strength averages 3-4/5    Time 12    Period Weeks    Status On-going      PT LONG TERM GOAL #4   Title able to lift her baby due to increased in core and pelvic strength and using correct body mechanics    Baseline not educated yet    Time 12    Period Weeks    Status On-going                 Plan - 03/28/20 1458    Clinical Impression Statement Patient is now using valium suppositories vaginally to reduce her muscle spasms. Her pain level today was 6/10 copared to 9/10. Patient has not had a painful flare-up that made it difficult for her to take care of her kids since last visit. Patient pelvis in correct alignment. Patient is now starting abdominal and SI stability exercises. Patient had fascial tightness suprapubically. Patient needs verbal cues to keep her pelvis steady with her exercises. Patient will benefit from skilled therapy to imporve her pain and pelvis stability to improve her function.    Personal Factors and Comorbidities Time since onset of injury/illness/exacerbation;Fitness    Examination-Activity Limitations Locomotion Level;Lift;Caring for Others;Bend;Squat;Bed Mobility    Examination-Participation Restrictions Cleaning;Community Activity    Stability/Clinical Decision Making Stable/Uncomplicated    Rehab Potential Excellent    PT Frequency 1x / week    PT Duration 12 weeks    PT Treatment/Interventions ADLs/Self Care Home Management;Cryotherapy;Electrical Stimulation;Iontophoresis 4mg /ml Dexamethasone;Moist Heat;Ultrasound;Therapeutic activities;Therapeutic exercise;Manual techniques;Patient/family education;Neuromuscular re-education;Dry needling;Energy conservation;Taping;Spinal Manipulations;Joint Manipulations    PT Next Visit Plan possible interanl work, work on iliacus pull back, sidely SI stability, glut4eal squeeze    PT  Home Exercise Plan Access Code: [email or web address]    Consulted and Agree with Plan of Care Patient           Patient will benefit from skilled therapeutic intervention in order to improve the following deficits and impairments:  Pain,Increased fascial restricitons,Decreased activity tolerance,Increased  muscle spasms,Decreased range of motion,Decreased strength  Visit Diagnosis: Muscle weakness (generalized)  Pelvic pain  Cramp and spasm     Problem List Patient Active Problem List   Diagnosis Date Noted  . 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 BMI 27.0-27.9,adult 03/11/2018    Eulis Foster, PT 03/28/20 3:03 PM   [place] [facility] at [facility] for [facility] [address], [address] [place], [place], [address] Phone: [phone]   Fax:  [phone]  Name: Chelsea Wallace MRN: [id number] Date of Birth: 1990/09/29

## 2020-03-28 NOTE — Patient Instructions (Signed)
Access Code: [email or web address] URL: [email or web address] Date: 03/28/2020 Prepared by: Eulis Foster  Program Notes massage the inner thighs for 2 minutes daily   Exercises Supine Transversus Abdominis Bracing - Hands on Stomach - 1 x daily - 7 x weekly - 1 sets - 10 reps - 5 sec hold Supine Transversus Abdominis Bracing - Hands on Stomach - 1 x daily - 7 x weekly - 1 sets - 10 reps - 5 sec hold Supine Bridge - 1 x daily - 7 x weekly - 1 sets - 10 reps Supine Alternating Shoulder Flexion - 1 x daily - 7 x weekly - 2 sets - 10 reps Eulis Foster, PT [facility] [address], [address] [place], [place] [address] W: [phone] [email or web address]

## 2020-04-04 ENCOUNTER — Encounter: Payer: Medicaid Other | Admitting: Physical Therapy

## 2020-04-11 ENCOUNTER — Encounter: Payer: Medicaid Other | Admitting: Physical Therapy

## 2020-04-11 ENCOUNTER — Encounter: Payer: Self-pay | Admitting: Physical Therapy

## 2020-04-11 ENCOUNTER — Other Ambulatory Visit: Payer: Self-pay

## 2020-04-11 DIAGNOSIS — R102 Pelvic and perineal pain unspecified side: Secondary | ICD-10-CM

## 2020-04-11 DIAGNOSIS — M6281 Muscle weakness (generalized): Secondary | ICD-10-CM

## 2020-04-11 DIAGNOSIS — R252 Cramp and spasm: Secondary | ICD-10-CM

## 2020-04-11 NOTE — Therapy (Signed)
[facility] Outpatient Rehabilitation at [facility] [address] [place], [place], [address] Phone: [phone]   Fax:  [phone]  Physical Therapy Treatment  Patient Details  Name: Chelsea Wallace MRN: [id number] Date of Birth: 1991-01-05 Referring Provider (PT): Sharen Counter CNM   Encounter Date: 04/11/2020   PT End of Session - 04/11/20 1453    Visit Number 6    Date for PT Re-Evaluation 05/09/20    Authorization Type Medicaid Healthy blue    Authorization Time Period 2/3-4/25    Authorization - Visit Number 5    Authorization - Number of Visits 12    PT Start Time 1400    PT Stop Time 1445    PT Time Calculation (min) 45 min    Activity Tolerance Patient tolerated treatment well;Patient limited by pain    Behavior During Therapy [facility] for tasks assessed/performed           Past Medical History:  Diagnosis Date  . GERD (gastroesophageal reflux disease)    on prescription for it; pt cannot remember name of med.    Past Surgical History:  Procedure Laterality Date  . MANDIBLE RECONSTRUCTION     lower jaw surgery 2011  . MANDIBLE SURGERY Bilateral    age 30 or 71, manibles fx to adjust bite    There were no vitals filed for this visit.   Subjective Assessment - 04/11/20 1406    Subjective I am still bleeding vaginally. The vaginal suppositories are not helping. The medication only makes her drowsy. The pain has eased up some.    Patient Stated Goals stop the pain    Currently in Pain? Yes    Pain Score 7     Pain Location Pelvis    Pain Orientation Right;Left    Pain Descriptors / Indicators Aching;Sharp;Shooting    Pain Type Acute pain    Pain Onset More than a month ago    Pain Frequency Constant    Aggravating Factors  sitting too long, standing too long, daily activities done for too long    Pain Relieving Factors only able to lay down    Multiple Pain Sites No              [facility] PT Assessment - 04/11/20 0001       AROM   Lumbar Flexion decreased by 25% and flexes more at the hips than the spine    Lumbar Extension decreased by 50%    Lumbar - Right Side Bend decreased by 25%    Lumbar - Left Side Bend decreased by 25%    Lumbar - Right Rotation decreased by 25%    Lumbar - Left Rotation decreased by 25%      Strength   Right Hip Flexion 4/5    Right Hip External Rotation  4+/5    Right Hip Internal Rotation 4+/5    Right Hip ABduction 3/5   limited A/ROM   Left Hip ABduction 4-/5      Palpation   SI assessment  Right ASIS anteriorly and shallow with tenderness; right pubic bone deeper                         [facility] Adult PT Treatment/Exercise - 04/11/20 0001      Lumbar Exercises: Supine   Pelvic Tilt 10 reps    Pelvic Tilt Limitations to reduce the lumbar lordosis      Lumbar Exercises: Quadruped   Madcat/Old  Horse 15 reps    Madcat/Old Horse Limitations tactile cues to engage the lower abdominals and increase flexion of the lower lumbar    Other Quadruped Lumbar Exercises Quadruped with therapist releaseing the anal coccygeal ligament with hip hinge, hip wiggle      Manual Therapy   Manual Therapy Soft tissue mobilization;Joint mobilization;Muscle Energy Technique;Myofascial release    Joint Mobilization sacral mobilization to correct rotation, gapping of the lumbar facets in sidely; PA and rotational mobilization grade 3 T5-L5    Soft tissue mobilization using the addaday to the hamstring, quadricep, gluteals and lumbar paraspinals    Myofascial Release fascial release of the lumbar to the abdomen in quadruped, tissue rolling of the abdomen    Muscle Energy Technique correct right ilium                  PT Education - 04/11/20 1452    Education Details Access Code: [email or web address]    Person(s) Educated Patient    Methods Explanation;Demonstration;Verbal cues;Handout    Comprehension Returned demonstration;Verbalized understanding            PT Short Term  Goals - 03/28/20 1501      PT SHORT TERM GOAL #1   Title independent with initial HEP to manage pain with correct body mechanics with less stress on the pubic symphysis and engaging the abdominals    Time 4    Period Weeks    Status Achieved             PT Long Term Goals - 04/11/20 1510      PT LONG TERM GOAL #1   Title independent with her advanced HEP for pelvic stability    Time 12    Period Weeks    Status On-going      PT LONG TERM GOAL #2   Title able to sit for 45 mintues with pain decreased </= 2/10 due ot her pelvis in correct alignment    Baseline pain level 7/10    Time 12    Period Weeks    Status On-going      PT LONG TERM GOAL #3   Title able to stand for 10 minutes with pain level decreased </= 2/10 due to increased in hip strength >/= 4+/5    Baseline pain level 7/10    Time 12    Period Weeks    Status On-going      PT LONG TERM GOAL #4   Title able to lift her baby due to increased in core and pelvic strength and using correct body mechanics    Baseline pain level 7/10    Time 12    Period Weeks    Status On-going                 Plan - 04/11/20 1453    Clinical Impression Statement Patient continues to have a pain level of 7/10 that does not seem to change after therapy or is temporary. Patient has fascial tightness in the lumbar and lower abdomen. Her pelvis was corrected with manual work. Patient has difficulty with spinal neutral in quadruped and needs tactile cues. Her movements with her pelvis and legs are shaky. She is walking slower when in therapy. Her lumbar ROM is limited by 25% in all directions. Patient reports the Valium supppositories are not providing pain relief. She reports she is keeping up her exercise. Patient is not able to perform an activity for a long period of time due to  increase in pain. She will lay down mostly to deal with her pain. Patient continues to bleed vaginally so we have not done internal work vaginally but  have done so anally. Patient will benefit from skilled therapy to improve her pain and pelvic stability to improve function.    Personal Factors and Comorbidities Time since onset of injury/illness/exacerbation;Fitness    Examination-Activity Limitations Locomotion Level;Lift;Caring for Others;Bend;Squat;Bed Mobility    Examination-Participation Restrictions Cleaning;Community Activity    Stability/Clinical Decision Making Stable/Uncomplicated    Rehab Potential Excellent    PT Frequency 1x / week    PT Duration 12 weeks    PT Treatment/Interventions ADLs/Self Care Home Management;Cryotherapy;Electrical Stimulation;Iontophoresis 4mg /ml Dexamethasone;Moist Heat;Ultrasound;Therapeutic activities;Therapeutic exercise;Manual techniques;Patient/family education;Neuromuscular re-education;Dry needling;Energy conservation;Taping;Spinal Manipulations;Joint Manipulations    PT Next Visit Plan possible internal work, work on iliacus pull back, sidely SI stability, gluteal squeeze, fascial release to the lumbar    PT Home Exercise Plan Access Code: [email or web address]    Consulted and Agree with Plan of Care Patient           Patient will benefit from skilled therapeutic intervention in order to improve the following deficits and impairments:  Pain,Increased fascial restricitons,Decreased activity tolerance,Increased muscle spasms,Decreased range of motion,Decreased strength  Visit Diagnosis: Muscle weakness (generalized)  Pelvic pain  Cramp and spasm     Problem List Patient Active Problem List   Diagnosis Date Noted  . 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 BMI 27.0-27.9,adult 03/11/2018    03/13/2018, PT 04/11/20 3:12 PM   [place] [facility] at [facility] for Women [address] [place], [place], [place] Phone: [phone]   Fax:  [phone]  Name: MIKHAELA ZAUGG MRN: [name] Date of Birth: 1990/03/01

## 2020-04-11 NOTE — Patient Instructions (Signed)
Access Code: [email or web address] URL: [email or web address] Date: 04/11/2020 Prepared by: Eulis Foster  Program Notes massage the inner thighs for 2 minutes daily   Exercises Supine Transversus Abdominis Bracing - Hands on Stomach - 1 x daily - 7 x weekly - 1 sets - 10 reps - 5 sec hold Supine Transversus Abdominis Bracing - Hands on Stomach - 1 x daily - 7 x weekly - 1 sets - 10 reps - 5 sec hold Supine Bridge - 1 x daily - 7 x weekly - 1 sets - 10 reps Supine Alternating Shoulder Flexion - 1 x daily - 7 x weekly - 2 sets - 10 reps Supine Posterior Pelvic Tilt - 2 x daily - 7 x weekly - 1 sets - 10 reps Cat Cow - 2 x daily - 7 x weekly - 1 sets - 10 reps Hip Hinge Rock Back - 2 x daily - 7 x weekly - 1 sets - 10 reps Eulis Foster, PT [facility] [address] [place], [place] [address] W: [phone] [email or web address]

## 2020-04-12 ENCOUNTER — Telehealth: Payer: Self-pay

## 2020-04-12 NOTE — Telephone Encounter (Signed)
Conversation with patient regarding FMLA paperwork. Advised pateint we did not authorize you  to be out of work until her physical therapy is over. She will refer back to PT to get her dates approved with them. Advised patient our FMLA paper was only good until 02/15/222. Patient voice understanding and will follow up as needed.

## 2020-04-13 ENCOUNTER — Encounter: Payer: Medicaid Other | Admitting: Physical Therapy

## 2020-04-18 ENCOUNTER — Telehealth: Payer: Self-pay | Admitting: Physical Therapy

## 2020-04-18 ENCOUNTER — Encounter: Payer: Medicaid Other | Attending: Advanced Practice Midwife | Admitting: Physical Therapy

## 2020-04-18 DIAGNOSIS — M6281 Muscle weakness (generalized): Secondary | ICD-10-CM | POA: Insufficient documentation

## 2020-04-18 DIAGNOSIS — R252 Cramp and spasm: Secondary | ICD-10-CM | POA: Insufficient documentation

## 2020-04-18 DIAGNOSIS — R102 Pelvic and perineal pain: Secondary | ICD-10-CM | POA: Insufficient documentation

## 2020-04-18 NOTE — Telephone Encounter (Signed)
Called patient about her missed appointment today at 16:00. Unable to leave a message due to mailbox is full.  Eulis Foster, PT @4 /05/2020@ 4:22 PM

## 2020-04-20 ENCOUNTER — Encounter: Payer: Medicaid Other | Admitting: Physical Therapy

## 2020-04-27 ENCOUNTER — Other Ambulatory Visit: Payer: Self-pay

## 2020-04-27 ENCOUNTER — Ambulatory Visit: Payer: Medicaid Other | Attending: Advanced Practice Midwife | Admitting: Physical Therapy

## 2020-04-27 ENCOUNTER — Encounter: Payer: Self-pay | Admitting: Physical Therapy

## 2020-04-27 DIAGNOSIS — R102 Pelvic and perineal pain: Secondary | ICD-10-CM | POA: Diagnosis present

## 2020-04-27 DIAGNOSIS — R252 Cramp and spasm: Secondary | ICD-10-CM | POA: Insufficient documentation

## 2020-04-27 DIAGNOSIS — M6281 Muscle weakness (generalized): Secondary | ICD-10-CM | POA: Diagnosis present

## 2020-04-27 NOTE — Therapy (Addendum)
[facility] [address] [place], [place], [address] Phone: [phone]   Fax:  [phone]  Physical Therapy Treatment  Patient Details  Name: Chelsea Wallace MRN: [id number] Date of Birth: July 03, 1990 Referring Provider (PT): Fatima Blank CNM   Encounter Date: 04/27/2020   PT End of Session - 04/27/20 1529     Visit Number 7    Authorization Time Period 2/3-4/25    Authorization - Visit Number 6    Authorization - Number of Visits 12    PT Start Time 0240    PT Stop Time 1525    PT Time Calculation (min) 40 min    Activity Tolerance Patient tolerated treatment well;Patient limited by pain    Behavior During Therapy [facility] for tasks assessed/performed             Past Medical History:  Diagnosis Date   GERD (gastroesophageal reflux disease)    on prescription for it; pt cannot remember name of med.    Past Surgical History:  Procedure Laterality Date   MANDIBLE RECONSTRUCTION     lower jaw surgery 2011   MANDIBLE SURGERY Bilateral    age 50 or 65, manibles fx to adjust bite    There were no vitals filed for this visit.   Subjective Assessment - 04/27/20 1500     Subjective I did not come to last visit due to being in pain. I have been bleeding very heavy.    Patient Stated Goals stop the pain    Currently in Pain? Yes    Pain Score 8     Pain Location Pelvis    Pain Orientation Right;Left    Pain Descriptors / Indicators Aching;Sharp;Shooting    Pain Type Acute pain    Pain Onset More than a month ago    Pain Frequency Constant    Aggravating Factors  sitting too long, standing too long, daily activities done for too long    Pain Relieving Factors only able to lay down    Multiple Pain Sites No                [facility] PT Assessment - 04/27/20 0001       Palpation   SI assessment  right ASIS is equal                           [facility] Adult PT Treatment/Exercise  - 04/27/20 0001       Lumbar Exercises: Stretches   Active Hamstring Stretch Right;Left;2 reps    Active Hamstring Stretch Limitations supine with legs on wall    Piriformis Stretch Right;Left    Piriformis Stretch Limitations supine with legs on wall    Other Lumbar Stretch Exercise hip adductor stretch with legs on the wall      Manual Therapy   Manual Therapy Soft tissue mobilization    Joint Mobilization right hip mobilization for inferior glide, posterior glide and distraction grade 3    Soft tissue mobilization release of the right posas, manual work to right quadratus, right SI joint, right lumbar paraspinals    Muscle Energy Technique correct right ilium                      PT Short Term Goals - 03/28/20 1501       PT SHORT TERM GOAL #1   Title independent with initial HEP to manage pain with correct body  mechanics with less stress on the pubic symphysis and engaging the abdominals    Time 4    Period Weeks    Status Achieved               PT Long Term Goals - 04/11/20 1510       PT LONG TERM GOAL #1   Title independent with her advanced HEP for pelvic stability    Time 12    Period Weeks    Status On-going      PT LONG TERM GOAL #2   Title able to sit for 45 mintues with pain decreased </= 2/10 due ot her pelvis in correct alignment    Baseline pain level 7/10    Time 12    Period Weeks    Status On-going      PT LONG TERM GOAL #3   Title able to stand for 10 minutes with pain level decreased </= 2/10 due to increased in hip strength >/= 4+/5    Baseline pain level 7/10    Time 12    Period Weeks    Status On-going      PT LONG TERM GOAL #4   Title able to lift her baby due to increased in core and pelvic strength and using correct body mechanics    Baseline pain level 7/10    Time 12    Period Weeks    Status On-going                   Plan - 04/27/20 1529     Clinical Impression Statement Patient continues to be in  pain. She has missed two appointment due to pain. Patient reports she will feel some better after therapy but the pain will come back by the end of the day. Patient reports she still is bleeding vaginally and when the pelvic pain is work there is heavier bleeding. Right ilium was rotated anteriorly but corrected with manual work. She has trigger points in the right quadratus, psoas, gluteals. Patient will be on hold till she sees Dr. Christin Fudge and then will come back to therapy.    Personal Factors and Comorbidities Time since onset of injury/illness/exacerbation;Fitness    Examination-Activity Limitations Locomotion Level;Lift;Caring for Others;Bend;Squat;Bed Mobility    Examination-Participation Restrictions Cleaning;Community Activity    Stability/Clinical Decision Making Stable/Uncomplicated    Rehab Potential Excellent    PT Frequency 1x / week    PT Duration 12 weeks    PT Treatment/Interventions ADLs/Self Care Home Management;Cryotherapy;Electrical Stimulation;Iontophoresis 60m/ml Dexamethasone;Moist Heat;Ultrasound;Therapeutic activities;Therapeutic exercise;Manual techniques;Patient/family education;Neuromuscular re-education;Dry needling;Energy conservation;Taping;Spinal Manipulations;Joint Manipulations    PT Next Visit Plan see what Dr. SWannetta Sendersays; if continues then need renewal for doctor and insurance.    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 [place] [address]: [phone]  Fax:  [phone]  Name: Chelsea Wallace MRN: [id number] Date of Birth: [birth year]  PHYSICAL THERAPY DISCHARGE SUMMARY  Visits from Start of Care: 7  Current functional level related to goals / functional outcomes: See above. Today the therapist called patient about her missed appointment on 6/23//2022. Patient reports she already called on that Monday or Tuesday to cancel the appointment. When the therapist asked patient about her reason for cancelling she hung on the therapist. Patient is being discharged due to 2 no shows in a row.    Remaining deficits: See above.    Education / Equipment: HEP   Patient agrees to discharge. Patient goals were not met. Patient is being discharged due to  attendance policy. Thank you for the referral. Earlie Counts, PT 07/06/20 4:00 PM

## 2020-05-04 ENCOUNTER — Encounter: Payer: Medicaid Other | Admitting: Physical Therapy

## 2020-05-04 NOTE — Progress Notes (Signed)
[facility] Return Visit  SUBJECTIVE  History of Present Illness: Chelsea Wallace is a 30 y.o. female seen in follow-up for pelvic pain/ levator spasm. Last visit was prescribed flexeril for muscle pain. She then felt that it was not helping and was prescribed valium over the phone. Has been attending pelvic PT but missed several visits due to pain.   Physical therapy helps for a short time but then later in the day gets worse. Did not see any kind of improvement with the muscle relaxers (flexeril or valium)- just made her feel sedated. She feels that recently her pain has been worse. It is sharp and crampy sensation in her whole pelvis.   Has not stopped bleeding since she delivered- has an [place] IUD in place. Had pelvic US In Nov 2021 but has not had one since then.   Past Medical History: Patient  has a past medical history of GERD (gastroesophageal reflux disease).   Past Surgical History: She  has a past surgical history that includes Mandible reconstruction and Mandible surgery (Bilateral).   Medications: She has a current medication list which includes the following prescription(s): amitriptyline, amitriptyline, amitriptyline, diazepam, acetaminophen, cyclobenzaprine, ibuprofen, oxycodone, and prenate mini.   Allergies: Patient has No Known Allergies.   Social History: Patient  reports that she has never smoked. She has never used smokeless tobacco. She reports that she does not drink alcohol and does not use drugs.      OBJECTIVE     Physical Exam: Vitals:   05/05/20 0910  BP: 120/78  Pulse: 79  Weight: 201 lb (91.2 kg)   Gen: No apparent distress, A&O x 3.  Detailed Urogynecologic Evaluation:  Deferred.    ASSESSMENT AND PLAN    Chelsea Wallace is a 30 y.o. with:  1. Pelvic pain   2. Levator spasm    - For levator spasm, we discussed the use of trigger point injections with anesthetic/ kenalog. She is hesitant to do this at this point. We also  reviewed the option of starting a TCA to help manage pain. She is open to this- will start amitriptyline and gradually increase dose, starting with 10mg  for one week, then 25mg  for a week, then 50mg . We discussed common side effects of dry mouth/ eyes or constipation.  - She will consider the trigger point injections - Will also repeat pelvic to r/o any further pelvic pathology and to ensure IUD is in place since pain has been worsening recently. Last pelvic In Nov 2021 showed IUD in place in upper uterine segment.  - Continue with physical therapy as scheduled.   Return 2 weeks for follow up  US, MD  Time spent: I spent 20 minutes dedicated to the care of this patient on the date of this encounter to include pre-visit review of records, face-to-face time with the patient and post visit documentation and ordering medication/ testing.

## 2020-05-05 ENCOUNTER — Ambulatory Visit (INDEPENDENT_AMBULATORY_CARE_PROVIDER_SITE_OTHER): Payer: Medicaid Other | Admitting: Obstetrics and Gynecology

## 2020-05-05 ENCOUNTER — Encounter: Payer: Self-pay | Admitting: Obstetrics and Gynecology

## 2020-05-05 ENCOUNTER — Other Ambulatory Visit: Payer: Self-pay

## 2020-05-05 VITALS — BP 120/78 | HR 79 | Wt 201.0 lb

## 2020-05-05 DIAGNOSIS — M62838 Other muscle spasm: Secondary | ICD-10-CM | POA: Diagnosis not present

## 2020-05-05 DIAGNOSIS — R102 Pelvic and perineal pain: Secondary | ICD-10-CM

## 2020-05-05 MED ORDER — AMITRIPTYLINE HCL 10 MG PO TABS: 10.0000 mg | ORAL_TABLET | Freq: Every day | ORAL | 0 refills | Status: DC

## 2020-05-05 MED ORDER — AMITRIPTYLINE HCL 25 MG PO TABS: 25.0000 mg | ORAL_TABLET | Freq: Every day | ORAL | 0 refills | Status: DC

## 2020-05-05 MED ORDER — AMITRIPTYLINE HCL 50 MG PO TABS: 50.0000 mg | ORAL_TABLET | Freq: Every day | ORAL | 5 refills | Status: DC

## 2020-05-05 NOTE — Patient Instructions (Signed)
Start Amitriptyline 10mg  for one week, then increase to 25mg  dose then to 50mg  dose. Consider pelvic floor muscle injections.   We will also do a pelvic ultrasound to look for any other cause of pain.

## 2020-05-08 ENCOUNTER — Telehealth: Payer: Self-pay | Admitting: Physical Therapy

## 2020-05-08 ENCOUNTER — Encounter: Payer: Medicaid Other | Admitting: Physical Therapy

## 2020-05-08 NOTE — Telephone Encounter (Signed)
Patient wanted to know if she should come in since she just started the new medication and has not felt the effects yet. PT decided to have patient cancel her appointment today and schedule another one. She should feel the results of the medication as the dosage is increasing.  Eulis Foster, PT @4 /25/2022@ 4:05 PM

## 2020-05-23 ENCOUNTER — Ambulatory Visit: Payer: Medicaid Other | Admitting: Obstetrics and Gynecology

## 2020-06-07 IMAGING — US US MFM OB DETAIL+14 WK
1 series · 13 of 28 positions shown · non-contrast
Comparison: none

[Series 1: us mfm ob detail+14 wk · 69 acquisitions, 13 frames shown]
[im 3/69]
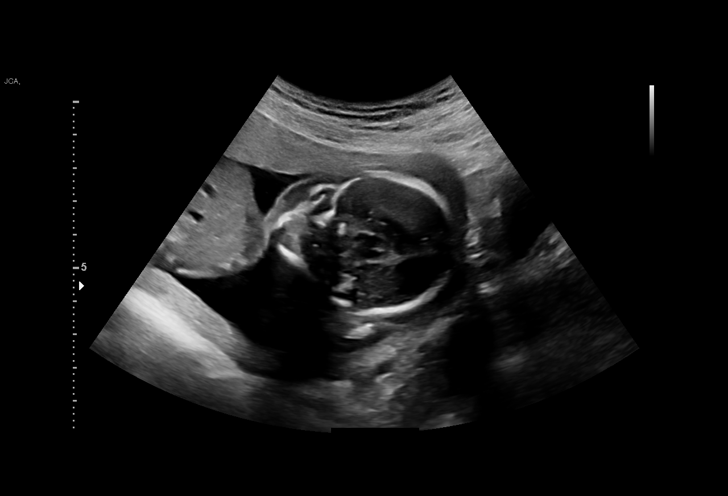
[im 8/69]
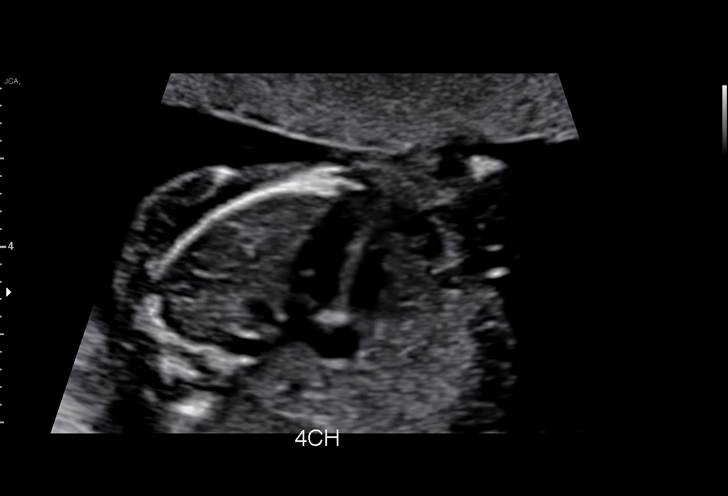
[im 13/69]
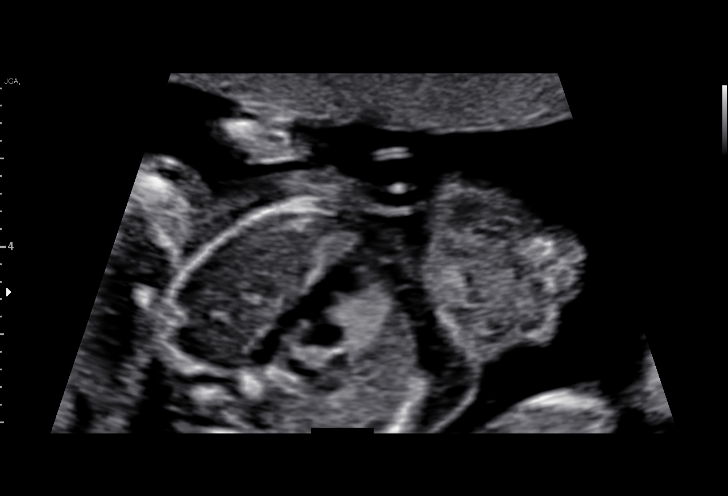
[im 18/69]
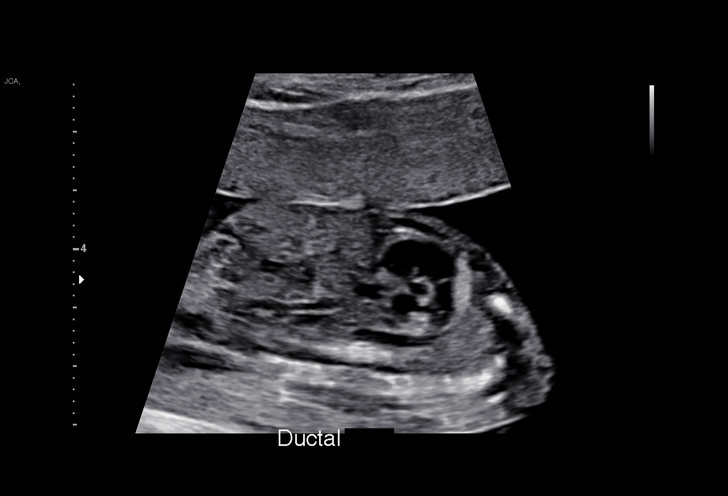
[im 23/69]
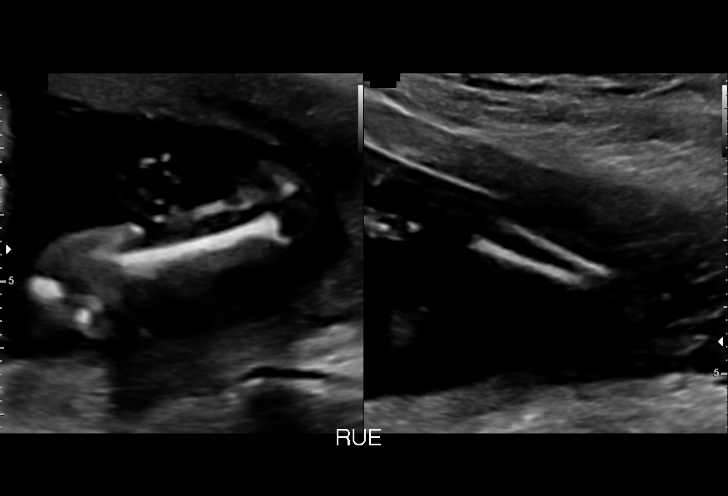
[im 28/69]
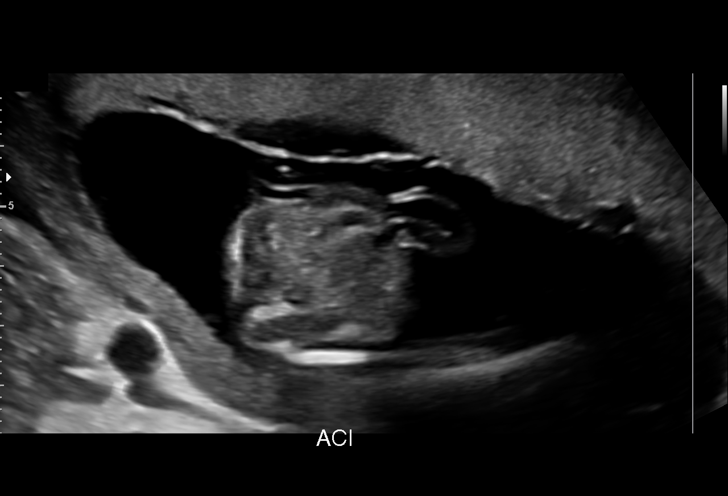
[im 36/69]
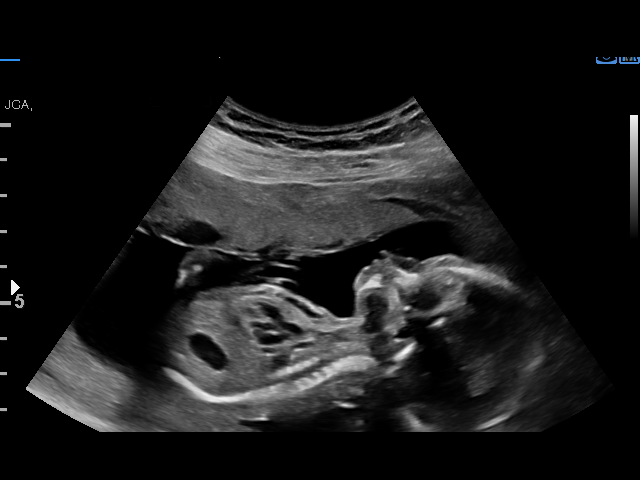
[im 41/69]
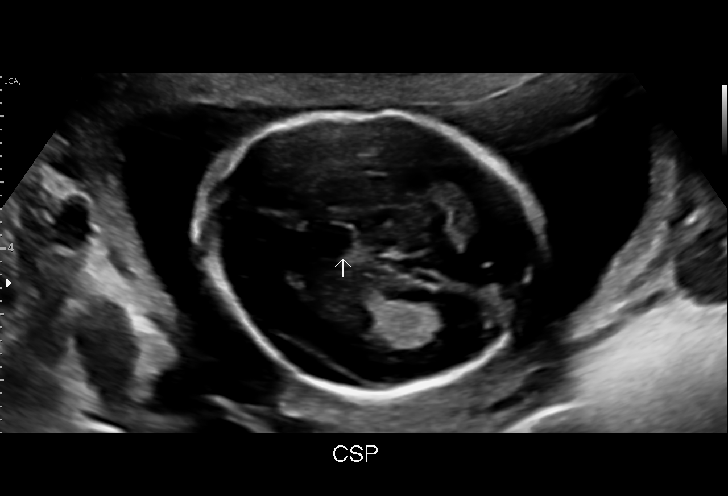
[im 46/69]
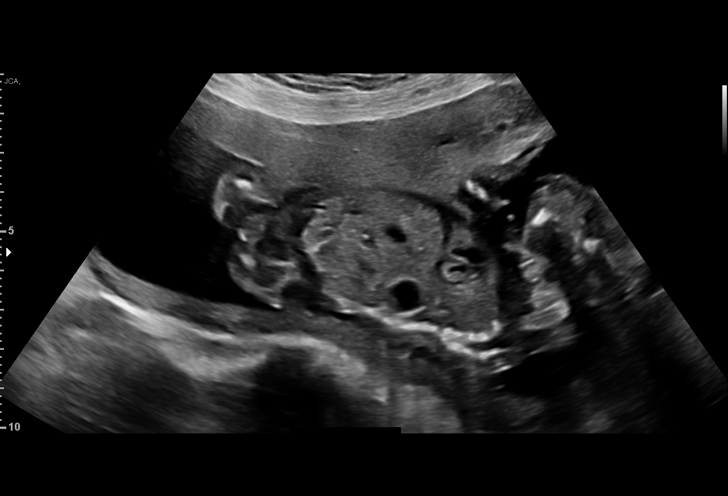
[im 51/69]
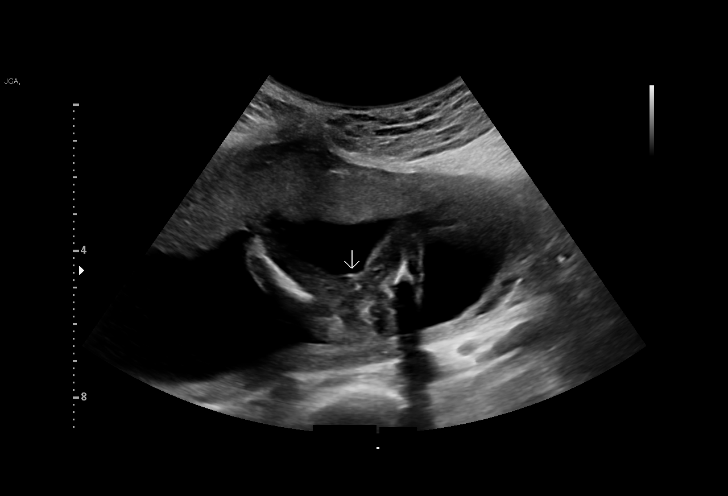
[im 56/69]
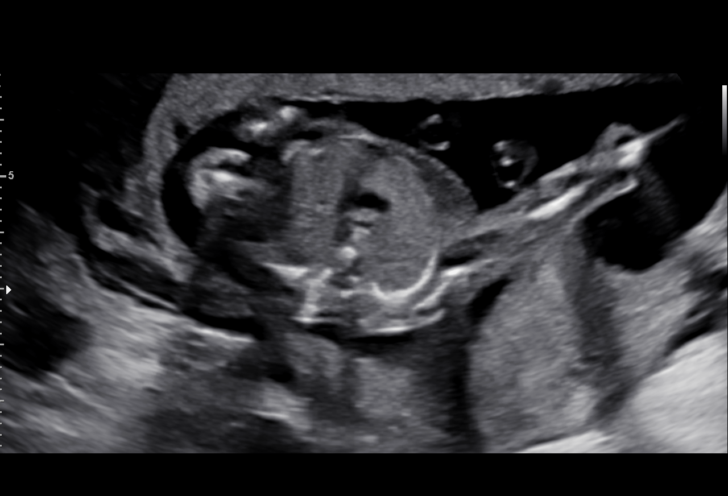
[im 61/69]
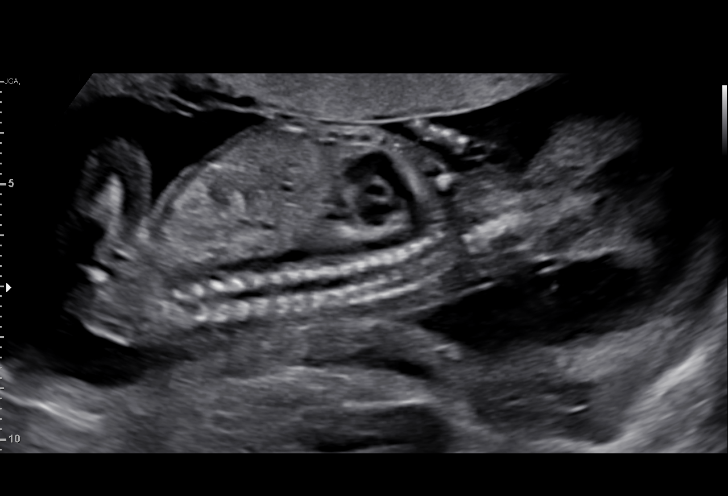
[im 66/69]
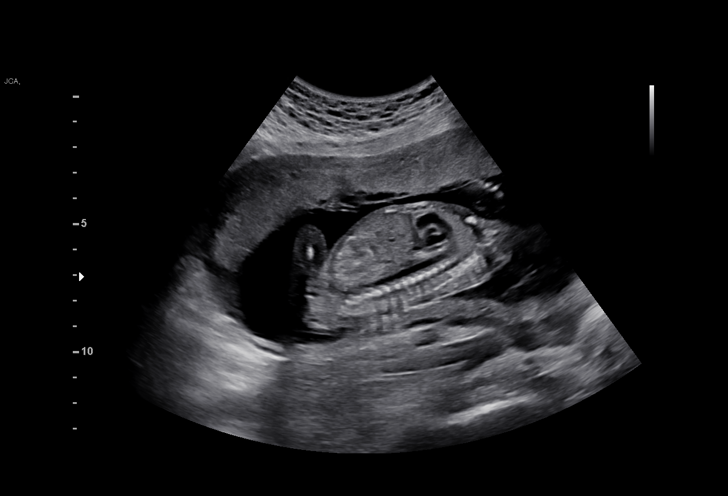

[13 of 28 positions shown; findings below may reference images not displayed]

Indications

 Encounter for antenatal screening for
 malformations (Low Risk Nips)
 Genetic carrier (SMA Carrier)
 18 weeks gestation of pregnancy
Fetal Evaluation

 Num Of Fetuses:         1
 Fetal Heart Rate(bpm):  148
 Cardiac Activity:       Observed
 Presentation:           Cephalic
 Placenta:               Anterior
 P. Cord Insertion:      Visualized

 Amniotic Fluid
 AFI FV:      Within normal limits

                             Largest Pocket(cm)

Biometry

 BPD:      42.9  mm     G. Age:  19w 0d         74  %    CI:        75.81   %    70 - 86
                                                         FL/HC:      16.9   %    15.8 - 18
 HC:      156.2  mm     G. Age:  18w 4d         48  %    HC/AC:      1.18        1.07 -
 AC:      132.9  mm     G. Age:  18w 5d         59  %    FL/BPD:     61.5   %
 FL:       26.4  mm     G. Age:  18w 0d         29  %    FL/AC:      19.9   %    20 - 24
 HUM:      25.3  mm     G. Age:  18w 0d         39  %
 CER:      19.8  mm     G. Age:  18w 6d         67  %
 NFT:       3.7  mm
 CM:        2.3  mm
 Est. FW:     241  gm      0 lb 9 oz     47  %
OB History

 Gravidity:    4         Term:   3
 Living:       3
Gestational Age

 U/S Today:     18w 4d                                        EDD:   10/22/19
 Best:          18w 3d     Det. By:  Previous Ultrasound      EDD:   10/23/19
                                     (03/19/19)
Anatomy

 Cranium:               Appears normal         Aortic Arch:            Appears normal
 Cavum:                 Appears normal         Ductal Arch:            Appears normal
 Ventricles:            Appears normal         Diaphragm:              Appears normal
 Choroid Plexus:        Appears normal         Stomach:                Appears normal, left
                                                                       sided
 Cerebellum:            Appears normal         Abdomen:                Appears normal
 Posterior Fossa:       Appears normal         Abdominal Wall:         Appears nml (cord
                                                                       insert, abd wall)
 Nuchal Fold:           Appears normal         Cord Vessels:           Appears normal (3
                                                                       vessel cord)
 Face:                  Orbits nl; profile not Kidneys:                Not well visualized
                        well visualized
 Lips:                  Not well visualized    Bladder:                Appears normal
 Thoracic:              Appears normal         Spine:                  Not well visualized
 Heart:                 Not well visualized    Upper Extremities:      Appears normal
 RVOT:                  Appears normal         Lower Extremities:      Appears normal
 LVOT:                  Appears normal

 Other:  Female gender
Cervix Uterus Adnexa

 Cervix
 Length:            3.6  cm.
 Normal appearance by transabdominal scan.
Comments

 This patient was seen for a detailed fetal anatomy scan as
 she is a carrier for spinal muscular atrophy.
 She denies any other significant past medical history and
 denies any problems in her current pregnancy.
 She had a cell free DNA test earlier in her pregnancy which
 indicated a low risk for trisomy 21, 18, and 13. A female fetus
 is predicted.
 She was informed that the fetal growth and amniotic fluid
 level were appropriate for her gestational age.
 There were no obvious fetal anomalies noted on today's
 ultrasound exam.  However, today's exam was limited due to
 the fetal position.
 The patient was informed that anomalies may be missed due
 to technical limitations. If the fetus is in a suboptimal position
 or maternal habitus is increased, visualization of the fetus in
 the maternal uterus may be impaired.
 A follow-up exam was scheduled in 4 weeks to complete the
 views of the fetal anatomy.

## 2020-06-29 ENCOUNTER — Ambulatory Visit: Payer: Medicaid Other | Attending: Advanced Practice Midwife | Admitting: Physical Therapy

## 2020-07-03 ENCOUNTER — Other Ambulatory Visit: Payer: Self-pay

## 2020-07-03 DIAGNOSIS — B9689 Other specified bacterial agents as the cause of diseases classified elsewhere: Secondary | ICD-10-CM

## 2020-07-03 MED ORDER — METRONIDAZOLE 500 MG PO TABS: 500.0000 mg | ORAL_TABLET | Freq: Two times a day (BID) | ORAL | 0 refills | Status: DC

## 2020-07-06 ENCOUNTER — Telehealth: Payer: Self-pay | Admitting: Physical Therapy

## 2020-07-06 ENCOUNTER — Ambulatory Visit: Payer: Medicaid Other | Admitting: Physical Therapy

## 2020-07-06 NOTE — Telephone Encounter (Signed)
Called patient about her missed appointment today. Spoke to patient about the missed appointment. She reported she called on Monday or Tuesday about not being able to come today. Therapist asked her for the reason of cancelling her appointment and she hung up on the therapist.  Eulis Foster, PT @6 /23/2022@ 3:56 PM

## 2020-07-08 IMAGING — US US MFM OB FOLLOW-UP
1 series · 13 of 28 positions shown · non-contrast
Comparison: none

[Series 1: us mfm ob follow-up · 13 of 113 slices shown]
[im 5/113]
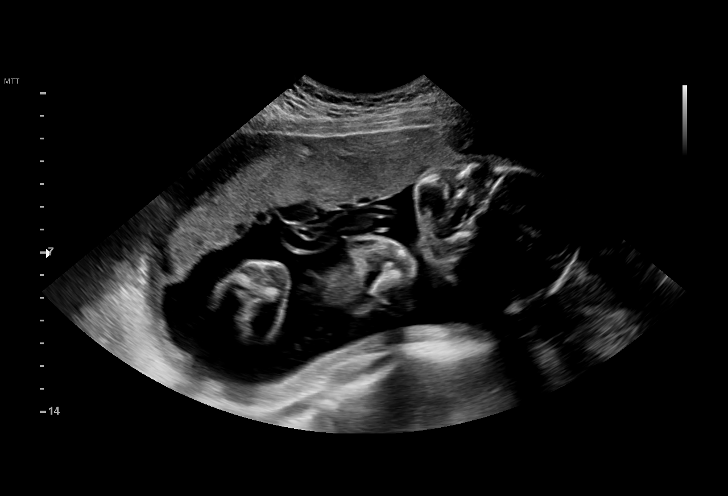
[im 13/113]
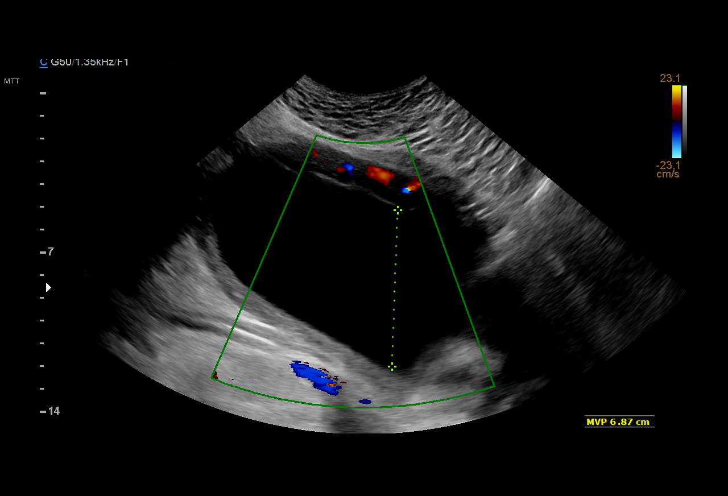
[im 21/113]
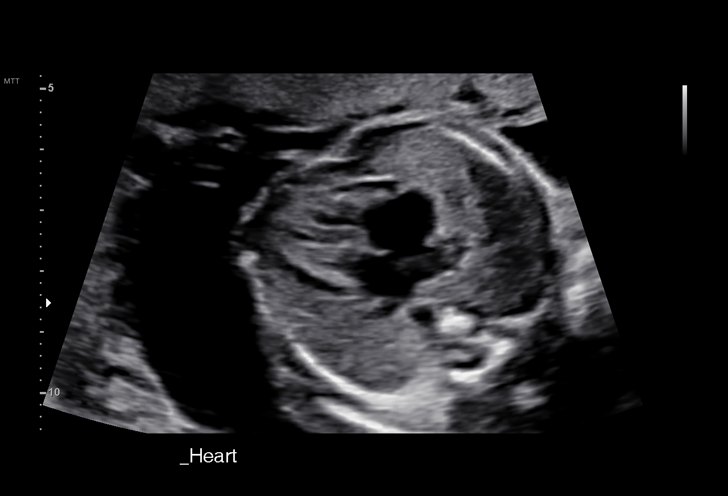
[im 30/113]
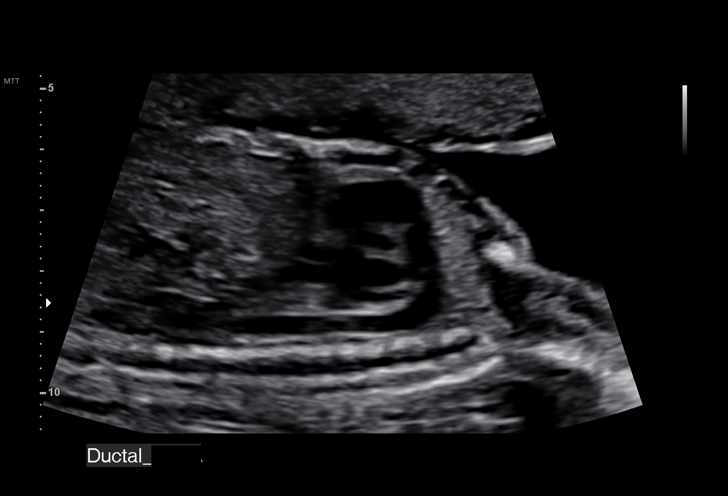
[im 38/113]
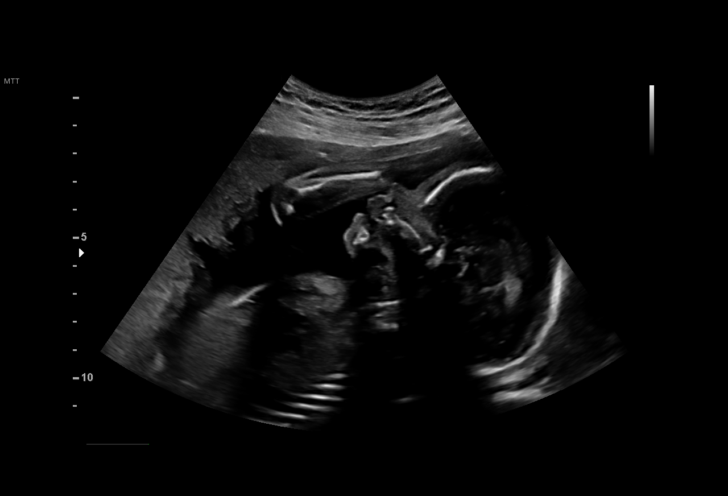
[im 46/113]
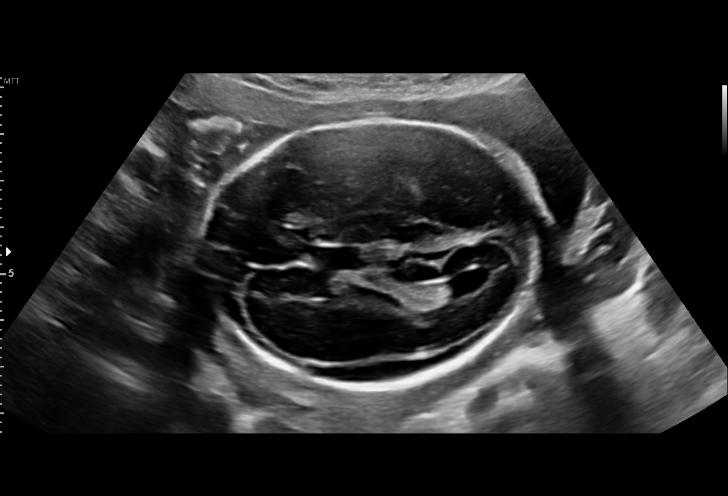
[im 59/113]
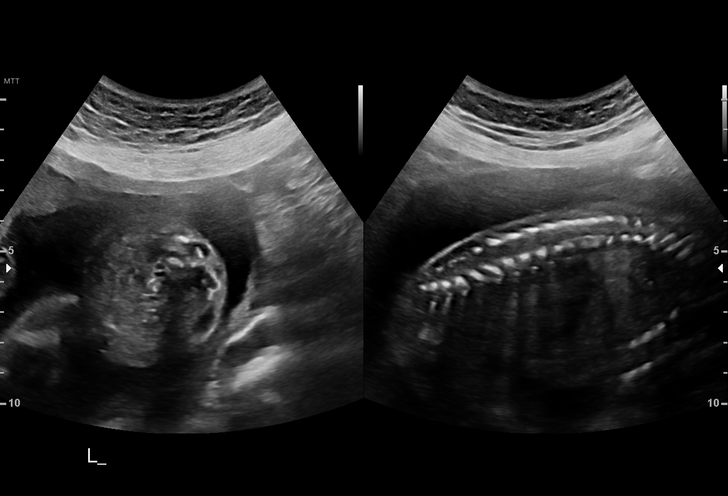
[im 67/113]
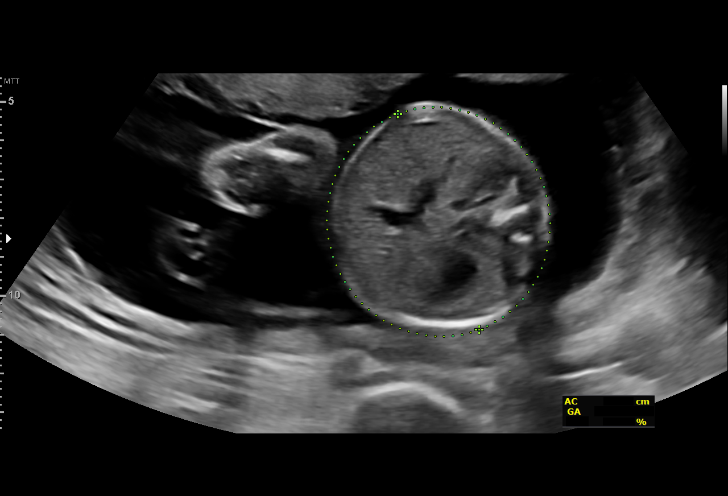
[im 75/113]
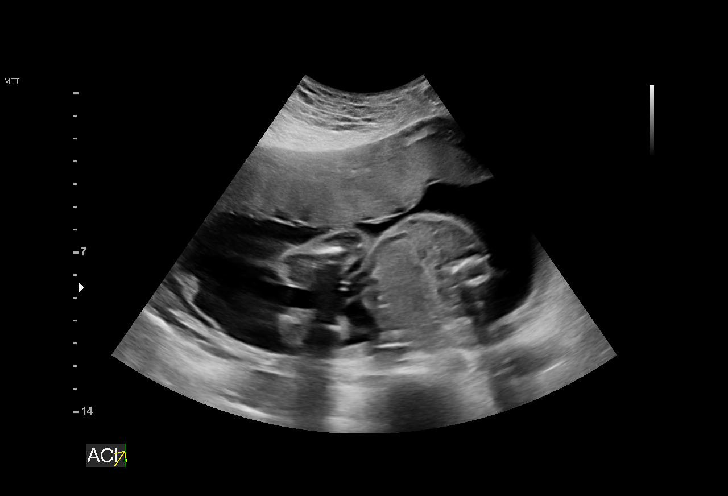
[im 83/113]
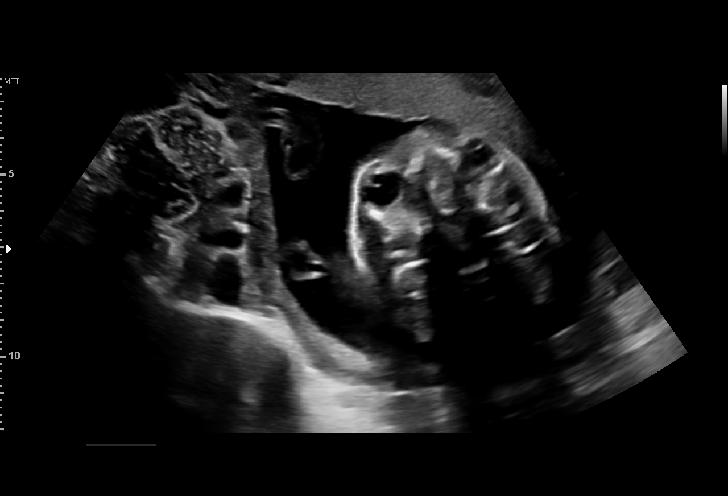
[im 92/113]
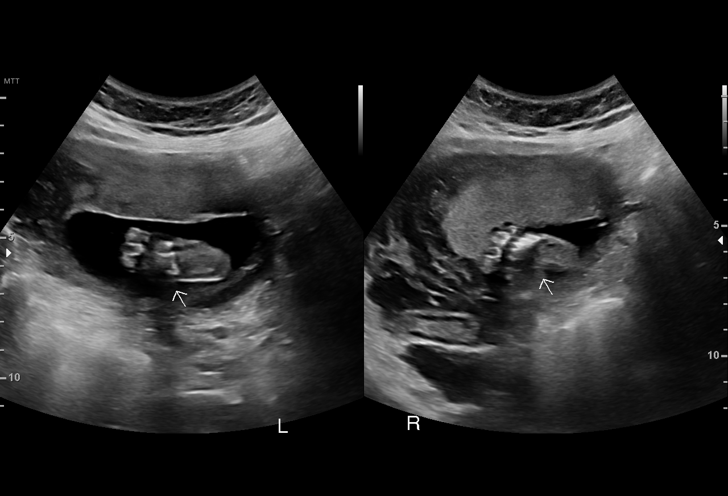
[im 100/113]
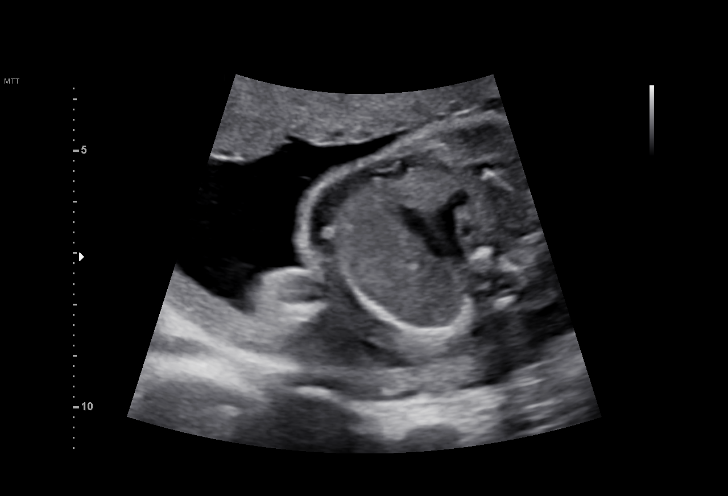
[im 108/113]
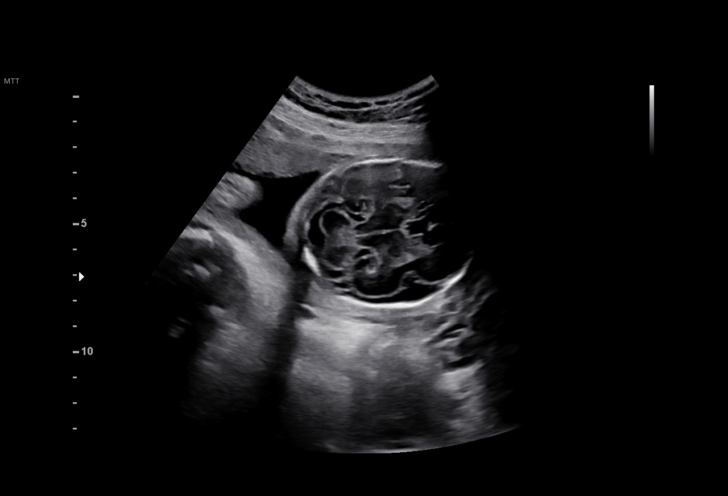

[13 of 28 positions shown; findings below may reference images not displayed]

Indications

 Antenatal follow-up for nonvisualized fetal
 anatomy
 22 weeks gestation of pregnancy
 Genetic carrier (SMA Carrier)
 Encounter for other antenatal screening
 follow-up (LR NIPS)
Fetal Evaluation

 Num Of Fetuses:         1
 Fetal Heart Rate(bpm):  148
 Cardiac Activity:       Observed
 Presentation:           Cephalic
 Placenta:               Anterior
 P. Cord Insertion:      Previously Visualized

 Amniotic Fluid
 AFI FV:      Within normal limits
Biometry

 BPD:      56.4  mm     G. Age:  23w 2d         60  %    CI:        71.21   %    70 - 86
                                                         FL/HC:      19.4   %    19.2 -
 HC:      212.9  mm     G. Age:  23w 2d         56  %    HC/AC:      1.16        1.05 -
 AC:      183.2  mm     G. Age:  23w 1d         51  %    FL/BPD:     73.0   %    71 - 87
 FL:       41.2  mm     G. Age:  23w 3d         56  %    FL/AC:      22.5   %    20 - 24
 HUM:      36.9  mm     G. Age:  22w 6d         44  %
 CER:      24.7  mm     G. Age:  22w 5d         48  %
 LV:        6.3  mm
 CM:        4.3  mm

 Est. FW:     577  gm      1 lb 4 oz     63  %
OB History

 Gravidity:    4         Term:   3
 Living:       3
Gestational Age

 U/S Today:     23w 2d                                        EDD:   10/20/19
 Best:          22w 6d     Det. By:  Previous Ultrasound      EDD:   10/23/19
                                     (03/19/19)
Anatomy

 Cranium:               Appears normal         Aortic Arch:            Appears normal
 Cavum:                 Appears normal         Ductal Arch:            Appears normal
 Ventricles:            Appears normal         Diaphragm:              Appears normal
 Choroid Plexus:        Appears normal         Stomach:                Appears normal, left
                                                                       sided
 Cerebellum:            Appears normal         Abdomen:                Appears normal
 Posterior Fossa:       Appears normal         Abdominal Wall:         Appears nml (cord
                                                                       insert, abd wall)
 Nuchal Fold:           Previously seen        Cord Vessels:           Appears normal (3
                                                                       vessel cord)
 Face:                  Appears normal         Kidneys:                Appear normal
                        (orbits and profile)
 Lips:                  Appears normal         Bladder:                Appears normal
 Thoracic:              Appears normal         Spine:                  Appears normal
 Heart:                 Appears normal         Upper Extremities:      Visualized
                        (4CH, axis, and
                        situs)
 RVOT:                  Appears normal         Lower Extremities:      Appears normal
 LVOT:                  Appears normal

 Other:  Fetus appears to be female. Heels visualized. Technically difficult due
         to fetal position.
Cervix Uterus Adnexa

 Cervix
 Length:            3.4  cm.
 Normal appearance by transabdominal scan.

 Uterus
 No abnormality visualized.

 Right Ovary
 Within normal limits. No adnexal mass visualized.

 Left Ovary
 Within normal limits. No adnexal mass visualized.

 Cul De Sac
 No free fluid seen.

 Adnexa
 No abnormality visualized.
Comments

 This patient was seen for a follow up exam as the views of
 the fetal anatomy were unable to be fully visualized during
 her last exam.  She denies any problems since her last exam.
 She was informed that the fetal growth and amniotic fluid
 level appears appropriate for her gestational age.
 The views of the fetal anatomy were visualized today.  There
 were no obvious anomalies noted.
 The limitations of ultrasound in the detection of all anomalies
 was discussed.
 Follow-up as indicated.

## 2020-07-13 ENCOUNTER — Encounter: Payer: Medicaid Other | Admitting: Physical Therapy

## 2020-07-20 ENCOUNTER — Encounter: Payer: Medicaid Other | Admitting: Physical Therapy

## 2020-07-21 ENCOUNTER — Other Ambulatory Visit: Payer: Self-pay | Admitting: *Deleted

## 2020-07-21 MED ORDER — FLUCONAZOLE 150 MG PO TABS: 150.0000 mg | ORAL_TABLET | Freq: Once | ORAL | 0 refills | Status: AC

## 2020-07-21 NOTE — Progress Notes (Signed)
Pt called with symptoms of yeast, would like Rx Diflucan sent today.

## 2020-07-27 ENCOUNTER — Encounter: Payer: Medicaid Other | Admitting: Physical Therapy

## 2020-08-03 ENCOUNTER — Encounter: Payer: Medicaid Other | Admitting: Physical Therapy

## 2020-08-06 ENCOUNTER — Other Ambulatory Visit: Payer: Self-pay | Admitting: Obstetrics

## 2020-08-06 DIAGNOSIS — N760 Acute vaginitis: Secondary | ICD-10-CM

## 2020-08-06 DIAGNOSIS — B9689 Other specified bacterial agents as the cause of diseases classified elsewhere: Secondary | ICD-10-CM

## 2020-08-07 ENCOUNTER — Ambulatory Visit: Payer: Medicaid Other | Admitting: Obstetrics and Gynecology

## 2020-08-07 ENCOUNTER — Other Ambulatory Visit: Payer: Self-pay

## 2020-08-07 ENCOUNTER — Telehealth: Payer: Self-pay | Admitting: Obstetrics and Gynecology

## 2020-08-07 DIAGNOSIS — N760 Acute vaginitis: Secondary | ICD-10-CM

## 2020-08-07 DIAGNOSIS — N898 Other specified noninflammatory disorders of vagina: Secondary | ICD-10-CM

## 2020-08-07 MED ORDER — METRONIDAZOLE 500 MG PO TABS: 500.0000 mg | ORAL_TABLET | Freq: Two times a day (BID) | ORAL | 0 refills | Status: DC

## 2020-08-07 MED ORDER — FLUCONAZOLE 150 MG PO TABS: 150.0000 mg | ORAL_TABLET | Freq: Once | ORAL | 0 refills | Status: DC

## 2020-08-08 NOTE — Progress Notes (Deleted)
[facility] Visit  SUBJECTIVE  History of Present Illness: Chelsea Wallace is a 30 y.o. female seen in follow-up for pelvic pain/ levator spasm. Plan at last visit was to start amitriptyline increasing gradually to 50mg  daily. She did not see improvement with muscle relaxers. Has been to physical therapy but cancelled recent appointments.     Past Medical History: Patient  has a past medical history of GERD (gastroesophageal reflux disease).   Past Surgical History: She  has a past surgical history that includes Mandible reconstruction and Mandible surgery (Bilateral).   Medications: She has a current medication list which includes the following prescription(s): acetaminophen, amitriptyline, amitriptyline, amitriptyline, cyclobenzaprine, diazepam, ibuprofen, metronidazole, metronidazole, oxycodone, and prenate mini.   Allergies: Patient has No Known Allergies.   Social History: Patient  reports that she has never smoked. She has never used smokeless tobacco. She reports that she does not drink alcohol and does not use drugs.      OBJECTIVE     Physical Exam: There were no vitals filed for this visit. Gen: No apparent distress, A&O x 3.  Detailed Urogynecologic Evaluation:  Deferred. Prior exam showed:  No flowsheet data found.     ASSESSMENT AND PLAN    Chelsea Wallace is a 30 y.o. with:  No diagnosis found.

## 2020-08-09 ENCOUNTER — Ambulatory Visit: Payer: Medicaid Other | Admitting: Obstetrics and Gynecology

## 2020-08-21 ENCOUNTER — Ambulatory Visit: Payer: Medicaid Other | Admitting: Obstetrics

## 2020-09-25 ENCOUNTER — Ambulatory Visit: Payer: Medicaid Other

## 2020-10-24 ENCOUNTER — Other Ambulatory Visit: Payer: Self-pay | Admitting: Obstetrics

## 2020-10-24 DIAGNOSIS — N898 Other specified noninflammatory disorders of vagina: Secondary | ICD-10-CM

## 2020-11-12 ENCOUNTER — Other Ambulatory Visit: Payer: Self-pay | Admitting: Obstetrics

## 2020-11-12 DIAGNOSIS — B9689 Other specified bacterial agents as the cause of diseases classified elsewhere: Secondary | ICD-10-CM

## 2020-12-20 IMAGING — US US PELVIS COMPLETE WITH TRANSVAGINAL
1 series · 15 of 25 positions shown · non-contrast
Comparison: None

CLINICAL DATA: Pelvic pain in a female, has an IUD; LMP 01/11/2019;
postpartum from vaginal delivery 10/25/2019, G4P4



[Series 1: us pelvis complete with transvaginal · 15 of 105 slices shown]
[im 1/105]
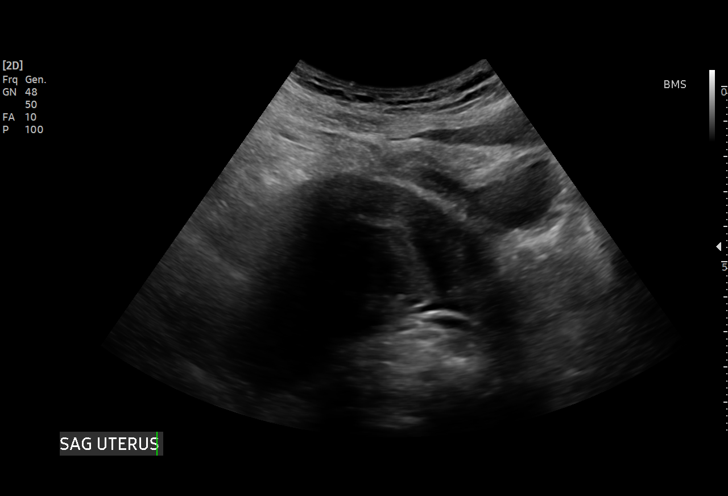
[im 9/105]
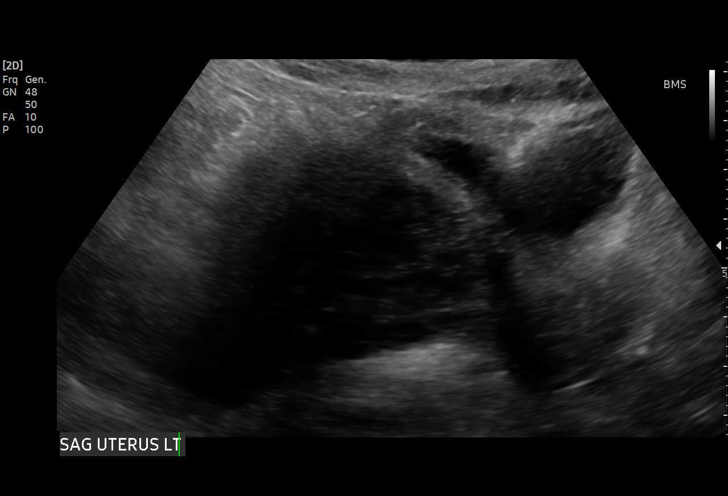
[im 18/105]
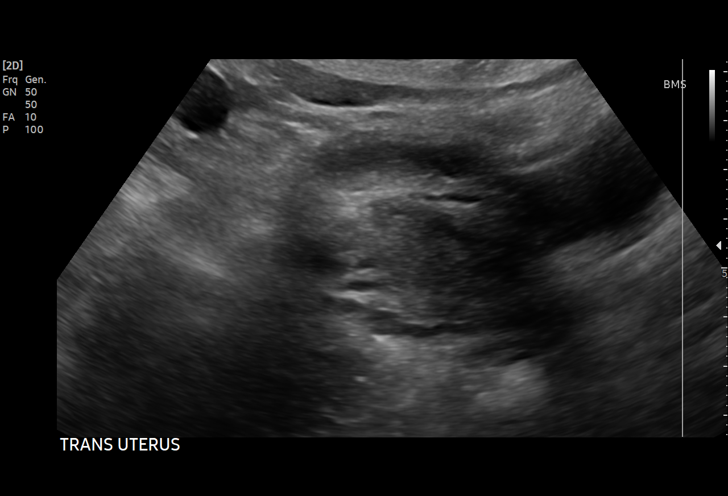
[im 22/105]
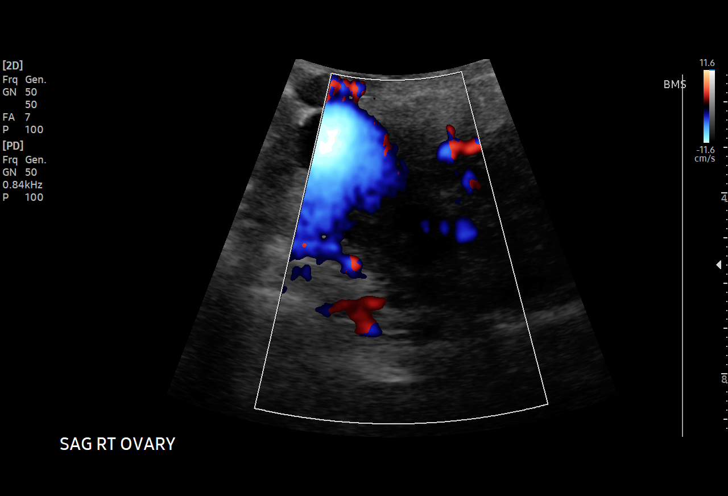
[im 31/105]
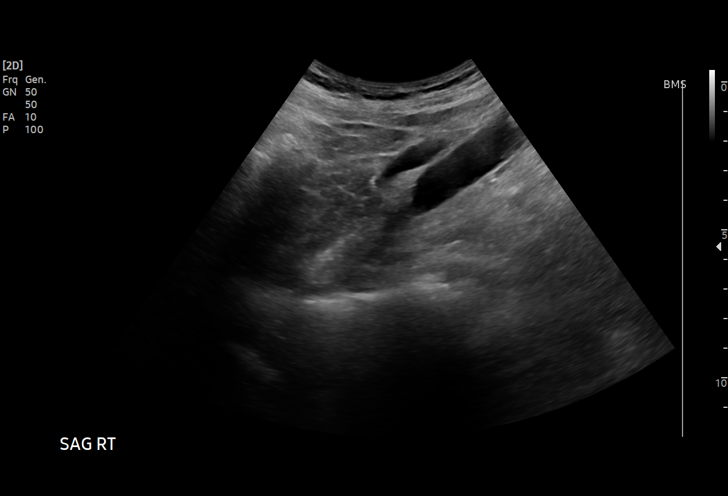
[im 40/105]
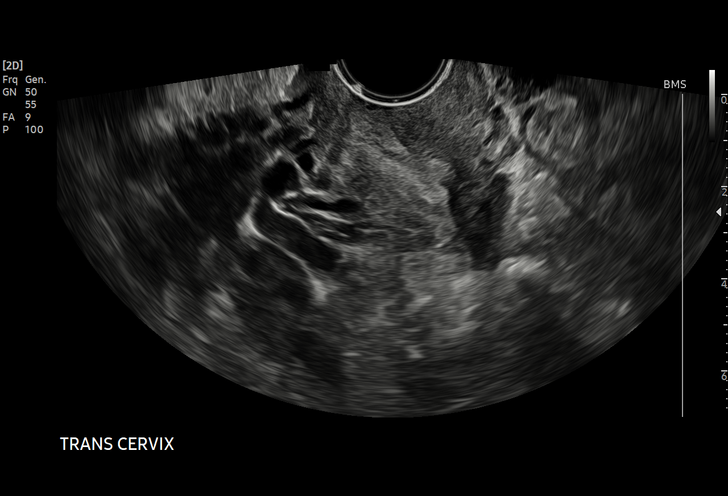
[im 44/105]
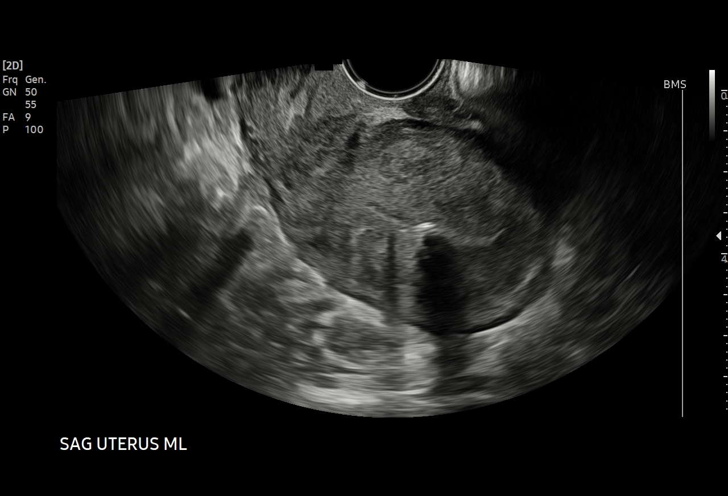
[im 53/105]
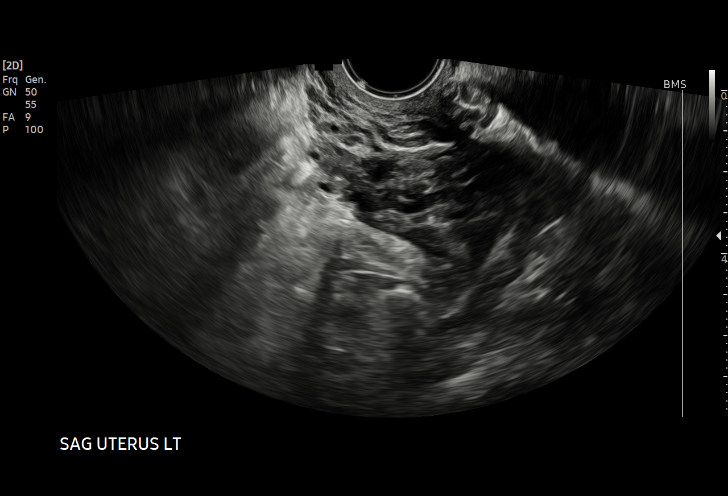
[im 61/105]
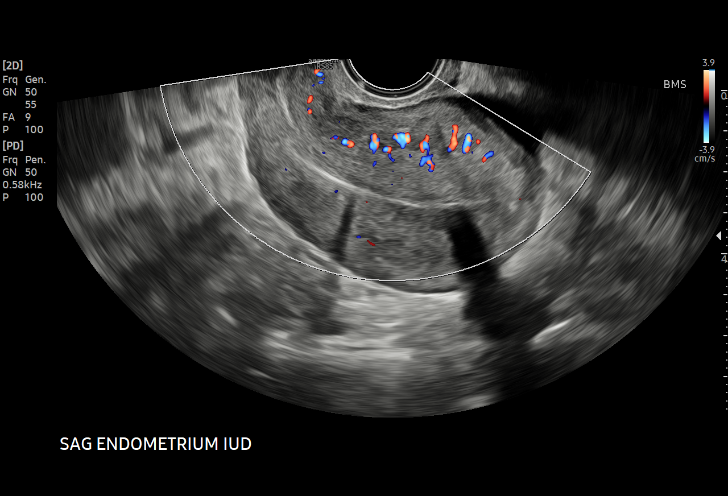
[im 66/105]
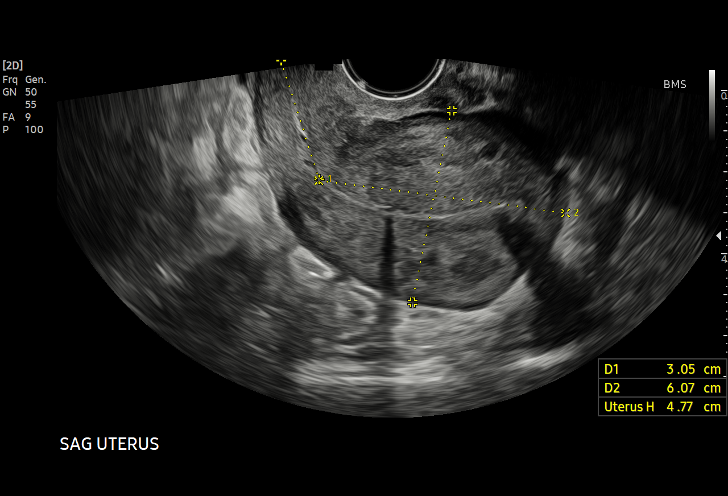
[im 74/105]
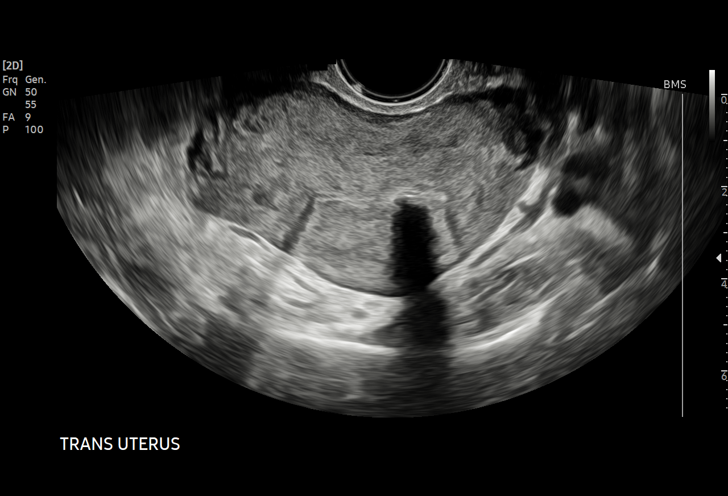
[im 83/105]
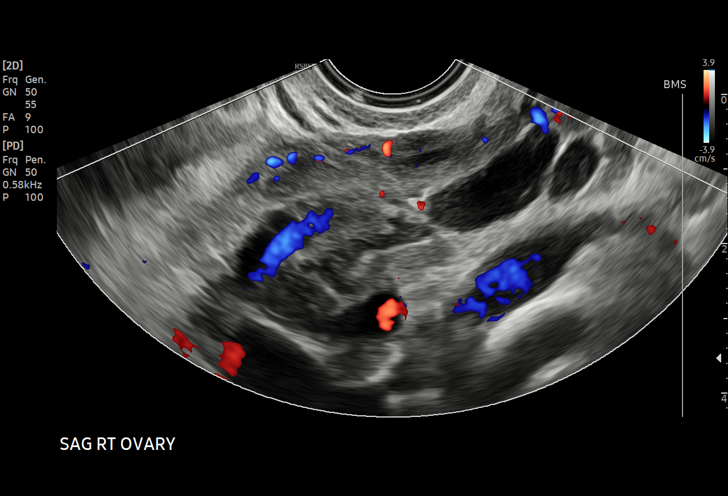
[im 87/105]
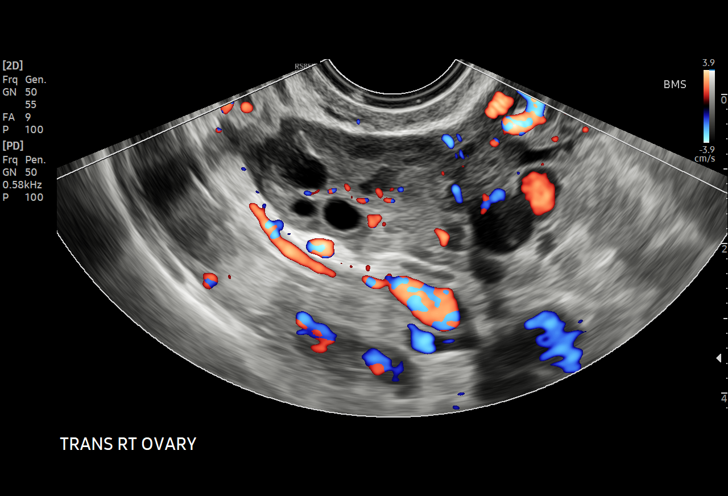
[im 96/105]
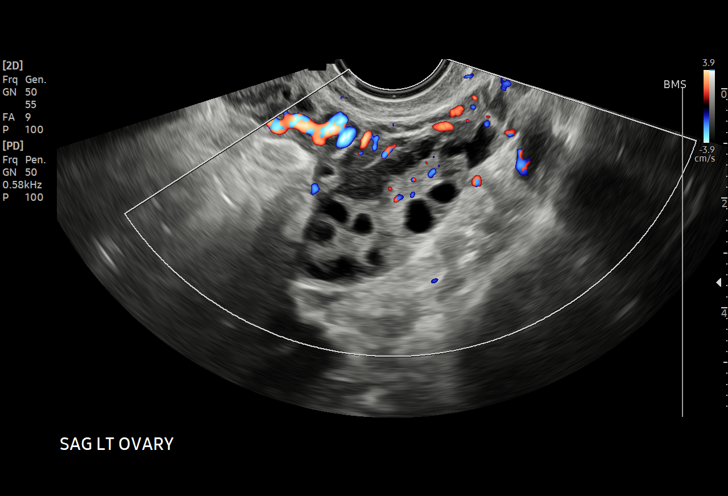
[im 105/105]
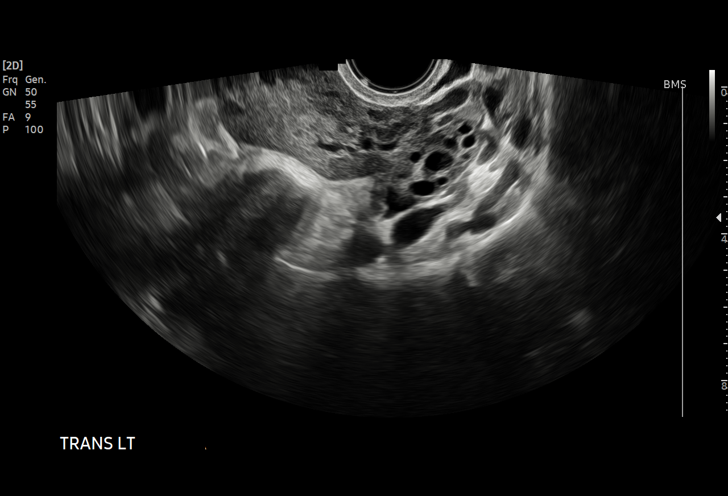

[15 of 25 positions shown; findings below may reference images not displayed]

FINDINGS: Uterus

Measurements: 7.5 x 3.5 x 5.0 cm = volume: 69 mL. Retroverted.
Normal morphology without mass

Endometrium

Thickness: 3 mm. IUD identified at upper uterine segment endometrial
canal. No endometrial fluid or focal mass.

Right ovary

Measurements: 24.3 x 1.9 x 2.4 cm = volume: 10.7 mL. Normal
morphology without mass

Left ovary

Measurements: 4.1 x 1.8 x 2.5 cm = volume: 9.7 mL. Numerous
follicles. No focal mass.

Other findings

Trace free pelvic fluid.  No adnexal masses.
IMPRESSION: IUD located at upper uterine segment endometrial canal.

Otherwise negative exam.

## 2021-01-14 NOTE — L&D Delivery Note (Signed)
OB/GYN Faculty Practice Delivery Note  Chelsea Wallace is a 31 y.o. [id number] s/p SVD at [redacted]w[redacted]d. She was admitted for eIOL.   ROM: 3h 35m with clear fluid GBS Status:  Negative/-- (09/21 1641) Maximum Maternal Temperature:  Temp (24hrs), Avg:98.2 F (36.8 C), Min:97.8 F (36.6 C), Max:98.5 F (36.9 C)    Labor Progress: Patient arrived at 1.5 cm dilation and was induced with pit, cytotec, FB.   Delivery Date/Time: 10/28/2021 at 1438 Delivery: Called to room and patient was complete and pushing. Head delivered in LOA position. Double nuchal cord present, reduced prior to delivery. Shoulder and body delivered in usual fashion. Infant with spontaneous cry, placed on mother's abdomen, dried and stimulated. Cord clamped x 2 after 1-minute delay, and cut by FOB. Cord blood drawn. Placenta delivered spontaneously with gentle cord traction. Fundus firm with massage and Pitocin. Labia, perineum, vagina, and cervix inspected with no lacerations.   Placenta: Spontaneous, intact, 3 vessel cord  Complications: None Lacerations: None EBL: 153 ml  Analgesia: epidural    Infant: APGAR (1 MIN): 8   APGAR (5 MINS): 9   APGAR (10 MINS):    Weight: pending   Gifford Shave, MD  10/28/2021 3:23 PM

## 2021-02-20 NOTE — Telephone Encounter (Signed)
Reviewed

## 2021-02-28 ENCOUNTER — Ambulatory Visit (INDEPENDENT_AMBULATORY_CARE_PROVIDER_SITE_OTHER): Payer: Medicaid Other

## 2021-02-28 ENCOUNTER — Other Ambulatory Visit: Payer: Self-pay

## 2021-02-28 VITALS — BP 116/78 | HR 99 | Ht 66.0 in | Wt 187.7 lb

## 2021-02-28 DIAGNOSIS — N912 Amenorrhea, unspecified: Secondary | ICD-10-CM

## 2021-02-28 DIAGNOSIS — Z3480 Encounter for supervision of other normal pregnancy, unspecified trimester: Secondary | ICD-10-CM | POA: Insufficient documentation

## 2021-02-28 DIAGNOSIS — Z3491 Encounter for supervision of normal pregnancy, unspecified, first trimester: Secondary | ICD-10-CM | POA: Insufficient documentation

## 2021-02-28 DIAGNOSIS — Z3401 Encounter for supervision of normal first pregnancy, first trimester: Secondary | ICD-10-CM | POA: Insufficient documentation

## 2021-02-28 LAB — POCT URINE PREGNANCY: Preg Test, Ur: POSITIVE — AB

## 2021-02-28 MED ORDER — PRENATE MINI 18-0.6-0.4-350 MG PO CAPS: 1.0000 | ORAL_CAPSULE | Freq: Every day | ORAL | 11 refills | Status: DC

## 2021-02-28 NOTE — Progress Notes (Addendum)
Chelsea Wallace presents today for UPT. She has no unusual complaints. LMP: 01/26/2021    OBJECTIVE: Appears well, in no apparent distress.  OB History     Gravida  4   Para  4   Term  4   Preterm  0   AB  0   Living  4      SAB  0   IAB  0   Ectopic  0   Multiple  0   Live Births  4          Home UPT Result: 3 + tests In-Office UPT result: positive I have reviewed the patient's medical, obstetrical, social, and family histories, and medications.   ASSESSMENT: Positive pregnancy test  Patient is [redacted]w[redacted]d EDD 11/02/21  PLAN Prenatal care to be completed at:

## 2021-04-13 ENCOUNTER — Ambulatory Visit (INDEPENDENT_AMBULATORY_CARE_PROVIDER_SITE_OTHER): Payer: Medicaid Other

## 2021-04-13 VITALS — BP 118/75 | HR 74 | Ht 66.0 in | Wt 191.2 lb

## 2021-04-13 DIAGNOSIS — Z3481 Encounter for supervision of other normal pregnancy, first trimester: Secondary | ICD-10-CM

## 2021-04-13 DIAGNOSIS — O3680X0 Pregnancy with inconclusive fetal viability, not applicable or unspecified: Secondary | ICD-10-CM

## 2021-04-13 NOTE — Progress Notes (Cosign Needed)
New OB Intake ? ?I connected with  Chelsea Wallace on 04/13/21 at 10:15 AM EDT by in person and verified that I am speaking with the correct person using two identifiers. Nurse is located at [facility] and pt is located at [place]. ? ?I discussed the limitations, risks, security and privacy concerns of performing an evaluation and management service by telephone and the availability of in person appointments. I also discussed with the patient that there may be a patient responsible charge related to this service. The patient expressed understanding and agreed to proceed. ? ?I explained I am completing New OB Intake today. We discussed her EDD of 11/02/21 that is based on LMP of 01/26/21. Pt is G5/P4004. I reviewed her allergies, medications, Medical/Surgical/OB history, and appropriate screenings. I informed her of [facility] [place] services. Based on history, this is a/an  pregnancy uncomplicated .  ? ?Patient Active Problem List  ? Diagnosis Date Noted  ? Encounter for supervision of normal first pregnancy in first trimester 02/28/2021  ? 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 BMI 27.0-27.9,adult 03/11/2018  ? ? ?Concerns addressed today ? ?Delivery Plans:  ?Plans to deliver at [facility].  ? ?MyChart/Babyscripts ?MyChart access verified. I explained pt will have some visits in office and some virtually. Babyscripts instructions given and order placed. Patient verifies receipt of registration text/e-mail. Account successfully created and app downloaded. ? ?Blood Pressure Cuff  ?Patient has BP cuff at home from her last pregnancy. Explained after first prenatal appt pt will check weekly and document in Babyscripts. ? ?Weight scale: Patient does have weight scale. Weight scale ordered for patient to pick up from [employer].  ? ?Anatomy US ?Explained first scheduled [place] will be around 19 weeks. Dating and viability ?Scheduled AFP lab only appointment if [facility] pt for same day as anatomy US.  ? ?Labs ?Discussed Avelina Laine genetic  screening with patient. Would like both Panorama and Horizon drawn at new OB visit.Also if interested in genetic testing, tell patient she will need AFP 15-21 weeks to complete genetic testing .Routine prenatal labs needed. ? ?Covid Vaccine ?Patient has covid vaccine.  ?  ?Is patient interested in [place]? Yes  "Interested in [facility] - Schedule next visit with CNM" on sticky note ? ?Informed patient of [facility] website  and placed link in her AVS.  ? ?Social Determinants of Health ?Food Insecurity: Patient denies food insecurity. ?WIC Referral: Patient is interested in referral to [facility].  ?Transportation: Patient denies transportation needs. ?Childcare: Discussed no children allowed at ultrasound appointments. Offered childcare services; patient declines childcare services at this time. ? ?Send link to Pregnancy Navigators ? ? ?Placed OB Box on problem list and updated ? ?First visit review ?I reviewed new OB appt with pt. I explained she will have a pelvic exam, ob bloodwork with genetic screening, and PAP smear. Explained pt will be seen by Coral Ceo at first visit; encounter routed to appropriate provider. Explained that patient will be seen by pregnancy navigator following visit with provider. [facility] information placed in AVS.  ? ?Hamilton Capri, RN ?04/13/2021  10:26 AM  ?

## 2021-04-19 ENCOUNTER — Ambulatory Visit (INDEPENDENT_AMBULATORY_CARE_PROVIDER_SITE_OTHER): Payer: Medicaid Other | Admitting: Obstetrics

## 2021-04-19 ENCOUNTER — Encounter: Payer: Self-pay | Admitting: Obstetrics

## 2021-04-19 ENCOUNTER — Other Ambulatory Visit (HOSPITAL_COMMUNITY)
Admission: RE | Admit: 2021-04-19 | Discharge: 2021-04-19 | Disposition: A | Discharge status: Home / Self Care | Payer: Medicaid Other | Source: Ambulatory Visit | Attending: Obstetrics | Admitting: Obstetrics

## 2021-04-19 VITALS — BP 115/76 | HR 84 | Wt 190.4 lb

## 2021-04-19 DIAGNOSIS — Z3481 Encounter for supervision of other normal pregnancy, first trimester: Secondary | ICD-10-CM

## 2021-04-19 NOTE — Progress Notes (Signed)
Subjective:  ? ? Chelsea Wallace is being seen today for her first obstetrical visit.  This is not a planned pregnancy. She is at [redacted]w[redacted]d gestation. Her obstetrical history is significant for  none . Relationship with FOB: significant other, not living together. Patient does intend to breast feed. Pregnancy history fully reviewed.  She reports a rash on upper body that itches, and has been present since conception.  ? ?The information documented in the HPI was reviewed and verified. ? ?Menstrual History: ?OB History   ? ? Gravida  ?5  ? Para  ?4  ? Term  ?4  ? Preterm  ?0  ? AB  ?0  ? Living  ?4  ?  ? ? SAB  ?0  ? IAB  ?0  ? Ectopic  ?0  ? Multiple  ?0  ? Live Births  ?4  ?   ?  ?  ?Patient's last menstrual period was 01/26/2021. ?  ? ?Past Medical History:  ?Diagnosis Date  ? GERD (gastroesophageal reflux disease)   ? on prescription for it; pt cannot remember name of med.  ?  ?Past Surgical History:  ?Procedure Laterality Date  ? MANDIBLE RECONSTRUCTION    ? lower jaw surgery 2011  ? MANDIBLE SURGERY Bilateral   ? age 80 or 24, manibles fx to adjust bite  ?  ?(Not in a hospital admission) ? ?No Known Allergies  ?Social History  ? ?Tobacco Use  ? Smoking status: Never  ? Smokeless tobacco: Never  ?Substance Use Topics  ? Alcohol use: No  ?  ?Family History  ?Problem Relation Age of Onset  ? Diabetes Mother   ? Diabetes Maternal Grandfather   ? Hypertension Paternal Grandmother   ? Hypertension Paternal Grandfather   ?  ? ?Review of Systems ?Constitutional: negative for weight loss ?Gastrointestinal: negative for vomiting ?Genitourinary:negative for genital lesions and vaginal discharge and dysuria ?Musculoskeletal:negative for back pain ?Behavioral/Psych: negative for abusive relationship, depression, illegal drug usage and tobacco use  ?  ?Objective:  ? ? BP 115/76   Pulse 84   Wt 190 lb 6.4 oz (86.4 kg)   LMP 01/26/2021   BMI 30.73 kg/m?  ?General Appearance:    Alert, cooperative, no distress, appears stated  age  ?Head:    Normocephalic, without obvious abnormality, atraumatic  ?Eyes:    PERRL, conjunctiva/corneas clear, EOM's intact, fundi  ?  benign, both eyes  ?Ears:    Normal TM's and external ear canals, both ears  ?Nose:   Nares normal, septum midline, mucosa normal, no drainage    or sinus tenderness  ?Throat:   Lips, mucosa, and tongue normal; teeth and gums normal  ?Neck:   Supple, symmetrical, trachea midline, no adenopathy;  ?  thyroid:  no enlargement/tenderness/nodules; no carotid ?  bruit or JVD  ?Back:     Symmetric, no curvature, ROM normal, no CVA tenderness  ?Lungs:     Clear to auscultation bilaterally, respirations unlabored  ?Chest Wall:    No tenderness or deformity  ? Heart:    Regular rate and rhythm, S1 and S2 normal, no murmur, rub   or gallop  ?Breast Exam:    No tenderness, masses, or nipple abnormality  ?Abdomen:     Soft, non-tender, bowel sounds active all four quadrants,  ?  no masses, no organomegaly  ?Genitalia:    Normal female without lesion, discharge or tenderness  ?Extremities:   Extremities normal, atraumatic, no cyanosis or edema  ?Pulses:  2+ and symmetric all extremities  ?Skin:   Skin color, texture, turgor normal, no rashes or lesions  ?Lymph nodes:   Cervical, supraclavicular, and axillary nodes normal  ?Neurologic:   CNII-XII intact, normal strength, sensation and reflexes  ?  throughout  ? ?  ? Lab Review ?Urine pregnancy test ?Labs reviewed yes ?Radiologic studies reviewed no ? ?Assessment:  ? ? Pregnancy at [redacted]w[redacted]d weeks  ?  ?Plan:  ? ? 1. Encounter for supervision of other normal pregnancy in first trimester ?Rx: ?- Cytology - PAP( [place]) ?- Cervicovaginal ancillary only( [place]) ?- Genetic Screening ?- Culture, OB Urine ?- CBC/D/Plt+RPR+Rh+ABO+Rub Ab...  ? ?Prenatal vitamins.  ?Counseling provided regarding continued use of seat belts, cessation of alcohol consumption, smoking or use of illicit drugs; infection precautions i.e., influenza/TDAP immunizations,  toxoplasmosis,CMV, parvovirus, listeria and varicella; workplace safety, exercise during pregnancy; routine dental care, safe medications, sexual activity, hot tubs, saunas, pools, travel, caffeine use, fish and methlymercury, potential toxins, hair treatments, varicose veins ?Weight gain recommendations per IOM guidelines reviewed: underweight/BMI< 18.5--> gain 28 - 40 lbs; normal weight/BMI 18.5 - 24.9--> gain 25 - 35 lbs; overweight/BMI 25 - 29.9--> gain 15 - 25 lbs; obese/BMI >30->gain  11 - 20 lbs ?Problem list reviewed and updated. ?FIRST/CF mutation testing/NIPT/QUAD SCREEN/fragile X/Ashkenazi Jewish population testing/Spinal muscular atrophy discussed: requested. ?Role of ultrasound in pregnancy discussed; fetal survey: requested. ?Amniocentesis discussed: not indicated. ? ? ?Orders Placed This Encounter  ?Procedures  ? Culture, OB Urine  ? Genetic Screening  ? CBC/D/Plt+RPR+Rh+ABO+Rub Ab...  ? ? ?Follow up in 4 weeks. ? ?I have spent a total of 20 minutes of face-to-face time, excluding clinical staff time, reviewing notes and preparing to see patient, ordering tests and/or medications, and counseling the patient.  ? ?Brock Bad, MD ?04/19/2021 4:01 PM  ?

## 2021-04-19 NOTE — Progress Notes (Signed)
Patient presents for New OB. Patient complains of having a rash on the upper half of her body. She states that the rash is itchy. Has been present since confirmed pregnancy. ?

## 2021-04-20 LAB — CBC/D/PLT+RPR+RH+ABO+RUBIGG...
Antibody Screen: NEGATIVE
Basophils Absolute: 0 10*3/uL (ref 0.0–0.2)
Basos: 0 %
EOS (ABSOLUTE): 0 10*3/uL (ref 0.0–0.4)
Eos: 0 %
HCV Ab: NONREACTIVE
HIV Screen 4th Generation wRfx: NONREACTIVE
Hematocrit: 37 % (ref 34.0–46.6)
Hemoglobin: 12.4 g/dL (ref 11.1–15.9)
Hepatitis B Surface Ag: NEGATIVE
Immature Grans (Abs): 0 10*3/uL (ref 0.0–0.1)
Immature Granulocytes: 0 %
Lymphocytes Absolute: 1.8 10*3/uL (ref 0.7–3.1)
Lymphs: 30 %
MCH: 30.8 pg (ref 26.6–33.0)
MCHC: 33.5 g/dL (ref 31.5–35.7)
MCV: 92 fL (ref 79–97)
Monocytes Absolute: 0.5 10*3/uL (ref 0.1–0.9)
Monocytes: 9 %
Neutrophils Absolute: 3.5 10*3/uL (ref 1.4–7.0)
Neutrophils: 61 %
Platelets: 165 10*3/uL (ref 150–450)
RBC: 4.02 x10E6/uL (ref 3.77–5.28)
RDW: 12.7 % (ref 11.7–15.4)
RPR Ser Ql: NONREACTIVE
Rh Factor: POSITIVE
Rubella Antibodies, IGG: 4.65 index (ref >0.99)
WBC: 5.8 10*3/uL (ref 3.4–10.8)

## 2021-04-20 LAB — CERVICOVAGINAL ANCILLARY ONLY
Bacterial Vaginitis (gardnerella): POSITIVE — AB
Candida Glabrata: NEGATIVE
Candida Vaginitis: NEGATIVE
Chlamydia: NEGATIVE
Comment: NEGATIVE
Comment: NEGATIVE
Comment: NEGATIVE
Comment: NEGATIVE
Comment: NEGATIVE
Comment: NORMAL
Neisseria Gonorrhea: NEGATIVE
Trichomonas: NEGATIVE

## 2021-04-20 LAB — HCV INTERPRETATION

## 2021-04-21 ENCOUNTER — Other Ambulatory Visit: Payer: Self-pay | Admitting: Obstetrics

## 2021-04-21 DIAGNOSIS — B9689 Other specified bacterial agents as the cause of diseases classified elsewhere: Secondary | ICD-10-CM

## 2021-04-21 LAB — CULTURE, OB URINE

## 2021-04-21 LAB — URINE CULTURE, OB REFLEX

## 2021-04-21 MED ORDER — METRONIDAZOLE 500 MG PO TABS: 500.0000 mg | ORAL_TABLET | Freq: Two times a day (BID) | ORAL | 2 refills | Status: DC

## 2021-04-24 LAB — CYTOLOGY - PAP: Diagnosis: NEGATIVE

## 2021-04-30 ENCOUNTER — Encounter: Payer: Self-pay | Admitting: Obstetrics

## 2021-05-17 ENCOUNTER — Ambulatory Visit (INDEPENDENT_AMBULATORY_CARE_PROVIDER_SITE_OTHER): Payer: Medicaid Other | Admitting: Obstetrics and Gynecology

## 2021-05-17 VITALS — BP 115/75 | HR 98 | Wt 191.3 lb

## 2021-05-17 DIAGNOSIS — Z3480 Encounter for supervision of other normal pregnancy, unspecified trimester: Secondary | ICD-10-CM

## 2021-05-17 DIAGNOSIS — Z148 Genetic carrier of other disease: Secondary | ICD-10-CM

## 2021-05-17 DIAGNOSIS — Z3A15 15 weeks gestation of pregnancy: Secondary | ICD-10-CM

## 2021-05-17 NOTE — Progress Notes (Signed)
? ?  PRENATAL VISIT NOTE ? ?Subjective:  ?Chelsea Wallace is a 31 y.o. G5P4004 at [redacted]w[redacted]d being seen today for ongoing prenatal care.  She is currently monitored for the following issues for this low-risk pregnancy and has BMI 27.0-27.9,adult; Genetic carrier; and Supervision of other normal pregnancy, antepartum on their problem list. ? ?Patient doing well with no acute concerns today. She reports no complaints.  Contractions: Not present. Vag. Bleeding: None.   . Denies leaking of fluid.  ? ?The following portions of the patient's history were reviewed and updated as appropriate: allergies, current medications, past family history, past medical history, past social history, past surgical history and problem list. Problem list updated. ? ?Objective:  ? ?Vitals:  ? 05/17/21 1541  ?BP: 115/75  ?Pulse: 98  ?Weight: 86.8 kg  ? ? ?Fetal Status: Fetal Heart Rate (bpm): 145 Fundal Height: 15 cm      ? ?General:  Alert, oriented and cooperative. Patient is in no acute distress.  ?Skin: Skin is warm and dry. No rash noted.   ?Cardiovascular: Normal heart rate noted  ?Respiratory: Normal respiratory effort, no problems with respiration noted  ?Abdomen: Soft, gravid, appropriate for gestational age.  Pain/Pressure: Absent     ?Pelvic: Cervical exam deferred        ?Extremities: Normal range of motion.  Edema: None  ?Mental Status:  Normal mood and affect. Normal behavior. Normal judgment and thought content.  ? ?Assessment and Plan:  ?Pregnancy: [id number] at [redacted]w[redacted]d ? ?1. [redacted] weeks gestation of pregnancy ? ? ?2. 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 ?3. Supervision of other normal pregnancy, antepartum ?Continue routine prenatal care ? ?- AFP, Serum, Open Spina Bifida ?- [place] MFM OB DETAIL +14 WK; Future ? ?Preterm labor symptoms and general obstetric precautions including but not limited to vaginal bleeding, contractions, leaking of fluid and fetal movement were reviewed in detail with the patient. ? ?Please refer to After Visit  Summary for other counseling recommendations.  ? ?Return in about 4 weeks (around 06/14/2021) for ROB, in person. ? ? ?Lynnda Shields, MD ?Faculty Attending ?[facility] [place] ?  ?

## 2021-05-17 NOTE — Progress Notes (Signed)
Pt reports not feeling fetal movement yet, denies pain. ?

## 2021-05-19 LAB — AFP, SERUM, OPEN SPINA BIFIDA
AFP MoM: 0.99
AFP Value: 28.4 ng/mL
Gest. Age on Collection Date: 15 weeks
Maternal Age At EDD: 28.9 yr
OSBR Risk 1 IN: 10000
Test Results:: NEGATIVE
Weight: 191 [lb_av]

## 2021-06-14 ENCOUNTER — Ambulatory Visit (INDEPENDENT_AMBULATORY_CARE_PROVIDER_SITE_OTHER): Payer: Medicaid Other | Admitting: Obstetrics

## 2021-06-14 ENCOUNTER — Encounter: Payer: Self-pay | Admitting: Obstetrics

## 2021-06-14 VITALS — BP 116/70 | HR 94 | Wt 198.3 lb

## 2021-06-14 DIAGNOSIS — M549 Dorsalgia, unspecified: Secondary | ICD-10-CM

## 2021-06-14 DIAGNOSIS — Z3480 Encounter for supervision of other normal pregnancy, unspecified trimester: Secondary | ICD-10-CM

## 2021-06-14 DIAGNOSIS — M5432 Sciatica, left side: Secondary | ICD-10-CM

## 2021-06-14 MED ORDER — COMFORT FIT MATERNITY SUPP SM MISC: 0 refills | Status: DC

## 2021-06-14 NOTE — Progress Notes (Signed)
Pt presents for ROB without complaints today.  

## 2021-06-14 NOTE — Progress Notes (Signed)
Subjective:  Chelsea Wallace is a 31 y.o. G5P4004 at [redacted]w[redacted]d being seen today for ongoing prenatal care.  She is currently monitored for the following issues for this low-risk pregnancy and has BMI 27.0-27.9,adult; Genetic carrier; and Supervision of other normal pregnancy, antepartum on their problem list.  Patient reports backache and sciatica on left side .  Contractions: Not present. Vag. Bleeding: None.  Movement: Absent. Denies leaking of fluid.   The following portions of the patient's history were reviewed and updated as appropriate: allergies, current medications, past family history, past medical history, past social history, past surgical history and problem list. Problem list updated.  Objective:   Vitals:   06/14/21 1510  BP: 116/70  Pulse: 94  Weight: 198 lb 4.8 oz (89.9 kg)    Fetal Status: Fetal Heart Rate (bpm): 154    Movement: Absent     General:  Alert, oriented and cooperative. Patient is in no acute distress.  Skin: Skin is warm and dry. No rash noted.   Cardiovascular: Normal heart rate noted  Respiratory: Normal respiratory effort, no problems with respiration noted  Abdomen: Soft, gravid, appropriate for gestational age. Pain/Pressure: Absent     Pelvic:  Cervical exam deferred        Extremities: Normal range of motion.  Edema: None  Mental Status: Normal mood and affect. Normal behavior. Normal judgment and thought content.   Urinalysis:      Assessment and Plan:  Pregnancy: G5P4004 at [redacted]w[redacted]d  1. Supervision of other normal pregnancy, antepartum  2. Backache symptom Rx: - Elastic Bandages & Supports (COMFORT FIT MATERNITY SUPP SM) MISC; Wear as directed.  Dispense: 1 each; Refill: 0  3. Sciatica of left side - Tylenol prn   Preterm labor symptoms and general obstetric precautions including but not limited to vaginal bleeding, contractions, leaking of fluid and fetal movement were reviewed in detail with the patient. Please refer to After Visit  Summary for other counseling recommendations.   Return in about 4 weeks (around 07/12/2021) for ROB.   Brock Bad, MD

## 2021-06-15 ENCOUNTER — Ambulatory Visit: Payer: Medicaid Other | Attending: Obstetrics and Gynecology

## 2021-06-15 ENCOUNTER — Ambulatory Visit: Payer: Medicaid Other | Admitting: *Deleted

## 2021-06-15 VITALS — BP 121/76 | HR 92

## 2021-06-15 DIAGNOSIS — Z148 Genetic carrier of other disease: Secondary | ICD-10-CM | POA: Diagnosis not present

## 2021-06-15 DIAGNOSIS — Z363 Encounter for antenatal screening for malformations: Secondary | ICD-10-CM | POA: Insufficient documentation

## 2021-06-15 DIAGNOSIS — Z3480 Encounter for supervision of other normal pregnancy, unspecified trimester: Secondary | ICD-10-CM

## 2021-06-15 DIAGNOSIS — Z3A19 19 weeks gestation of pregnancy: Secondary | ICD-10-CM | POA: Diagnosis not present

## 2021-06-15 DIAGNOSIS — Z3483 Encounter for supervision of other normal pregnancy, third trimester: Secondary | ICD-10-CM

## 2021-06-15 DIAGNOSIS — O99212 Obesity complicating pregnancy, second trimester: Secondary | ICD-10-CM | POA: Diagnosis not present

## 2021-06-18 ENCOUNTER — Other Ambulatory Visit: Payer: Self-pay | Admitting: *Deleted

## 2021-06-18 DIAGNOSIS — R638 Other symptoms and signs concerning food and fluid intake: Secondary | ICD-10-CM

## 2021-06-18 DIAGNOSIS — Z362 Encounter for other antenatal screening follow-up: Secondary | ICD-10-CM

## 2021-06-23 ENCOUNTER — Inpatient Hospital Stay (HOSPITAL_COMMUNITY)
Admission: AD | Admit: 2021-06-23 | Discharge: 2021-06-23 | Disposition: A | Discharge status: Home / Self Care | Payer: Medicaid Other | Attending: Obstetrics and Gynecology | Admitting: Obstetrics and Gynecology

## 2021-06-23 ENCOUNTER — Inpatient Hospital Stay (HOSPITAL_BASED_OUTPATIENT_CLINIC_OR_DEPARTMENT_OTHER): Payer: Medicaid Other

## 2021-06-23 ENCOUNTER — Other Ambulatory Visit: Payer: Self-pay

## 2021-06-23 ENCOUNTER — Encounter (HOSPITAL_COMMUNITY): Payer: Self-pay | Admitting: Obstetrics and Gynecology

## 2021-06-23 DIAGNOSIS — Z3A21 21 weeks gestation of pregnancy: Secondary | ICD-10-CM | POA: Insufficient documentation

## 2021-06-23 DIAGNOSIS — N930 Postcoital and contact bleeding: Secondary | ICD-10-CM | POA: Insufficient documentation

## 2021-06-23 DIAGNOSIS — Z3A20 20 weeks gestation of pregnancy: Secondary | ICD-10-CM | POA: Diagnosis not present

## 2021-06-23 DIAGNOSIS — Z6740 Type O blood, Rh positive: Secondary | ICD-10-CM | POA: Insufficient documentation

## 2021-06-23 DIAGNOSIS — O99612 Diseases of the digestive system complicating pregnancy, second trimester: Secondary | ICD-10-CM | POA: Insufficient documentation

## 2021-06-23 DIAGNOSIS — O4692 Antepartum hemorrhage, unspecified, second trimester: Secondary | ICD-10-CM | POA: Insufficient documentation

## 2021-06-23 DIAGNOSIS — O26892 Other specified pregnancy related conditions, second trimester: Secondary | ICD-10-CM | POA: Diagnosis present

## 2021-06-23 DIAGNOSIS — Z3492 Encounter for supervision of normal pregnancy, unspecified, second trimester: Secondary | ICD-10-CM

## 2021-06-23 LAB — URINALYSIS, ROUTINE W REFLEX MICROSCOPIC
Bilirubin Urine: NEGATIVE
Glucose, UA: NEGATIVE mg/dL
Ketones, ur: NEGATIVE mg/dL
Nitrite: NEGATIVE
Protein, ur: NEGATIVE mg/dL
Specific Gravity, Urine: 1.025 (ref 1.005–1.030)
pH: 6.5 (ref 5.0–8.0)

## 2021-06-23 LAB — URINALYSIS, MICROSCOPIC (REFLEX)

## 2021-06-23 LAB — CBC
HCT: 32.8 % — ABNORMAL LOW (ref 36.0–46.0)
Hemoglobin: 10.9 g/dL — ABNORMAL LOW (ref 12.0–15.0)
MCH: 30.7 pg (ref 26.0–34.0)
MCHC: 33.2 g/dL (ref 30.0–36.0)
MCV: 92.4 fL (ref 80.0–100.0)
Platelets: 140 10*3/uL — ABNORMAL LOW (ref 150–400)
RBC: 3.55 MIL/uL — ABNORMAL LOW (ref 3.87–5.11)
RDW: 13.3 % (ref 11.5–15.5)
WBC: 4.8 10*3/uL (ref 4.0–10.5)
nRBC: 0 % (ref 0.0–0.2)

## 2021-06-23 NOTE — MAU Note (Signed)
Chelsea Wallace is a 31 y.o. at [redacted]w[redacted]d here in MAU reporting: vaginal bleeding and right sided abdominal pain started this morning. States bleeding was heavier when she first got up this morning, has not seen any in her panties, only in the toilet and on toilet paper. Now when using the bathroom in MAU it seems lighter. Last IC was this AM. States has been seeing clear watery fluid for the past couple of weeks, states she mentioned it at her u/s and they said everything looked normal.   Onset of complaint: today  Pain score: 5/10  Vitals:   06/23/21 0910  BP: (!) 91/50  Pulse: 99  Resp: 16  Temp: 99 F (37.2 C)  SpO2: 97%     FHT:152  Lab orders placed from triage: UA

## 2021-06-23 NOTE — MAU Provider Note (Signed)
History     CSN: [id number]  Arrival date and time: 06/23/21 0857  Event Date/Time  First Provider Initiated Contact with Patient 06/23/21 [phone]      Chief Complaint  Patient presents with   Abdominal Pain   Vaginal Bleeding   HPI Chelsea Wallace is a 31 y.o. G5P4004 at [redacted]w[redacted]d who presents to MAU with chief complaint of heavy vaginal bleeding. Patient reports sexual intercourse early this morning. Her partner noted blood on his penis. She got out of bed to void and noted blood on her toilet tissue and running down her legs. She also voices concern that she had blood on the leggings she donned this morning.   Patient endorses right mid abdominal pain, onset coincides with onset of bleeding. Pain score is 5/10. She denies aggravating or alleviating factors. She has not taken medication for this complaint and declines pain medication in MAU.  Patient receives care with [facility].   OB History     Gravida  5   Para  4   Term  4   Preterm  0   AB  0   Living  4      SAB  0   IAB  0   Ectopic  0   Multiple  0   Live Births  4           Past Medical History:  Diagnosis Date   GERD (gastroesophageal reflux disease)    on prescription for it; pt cannot remember name of med.    Past Surgical History:  Procedure Laterality Date   MANDIBLE RECONSTRUCTION     lower jaw surgery 2011   MANDIBLE SURGERY Bilateral    age 73 or 76, manibles fx to adjust bite   WISDOM TOOTH EXTRACTION      Family History  Problem Relation Age of Onset   Diabetes Mother    Diabetes Maternal Grandfather    Hypertension Paternal Grandmother    Hypertension Paternal Grandfather     Social History   Tobacco Use   Smoking status: Never   Smokeless tobacco: Never  Vaping Use   Vaping Use: Never used  Substance Use Topics   Alcohol use: No   Drug use: No    Allergies: No Known Allergies  Medications Prior to Admission  Medication Sig Dispense Refill Last Dose    acetaminophen (TYLENOL) 325 MG tablet Take 2 tablets (650 mg total) by mouth every 6 (six) hours as needed.      Elastic Bandages & Supports (COMFORT FIT MATERNITY SUPP SM) MISC Wear as directed. (Patient not taking: Reported on 06/15/2021) 1 each 0    Prenat-FeCbn-FeAsp-Meth-FA-DHA (PRENATE MINI) 18-0.6-0.4-350 MG CAPS Take 1 capsule by mouth daily. 30 capsule 11     Review of Systems  Gastrointestinal:  Positive for abdominal pain.  Genitourinary:  Positive for vaginal bleeding.  All other systems reviewed and are negative.  Physical Exam   Blood pressure 108/68, pulse 97, temperature 99 F (37.2 C), temperature source Oral, resp. rate 16, height 5\' 5"  (1.651 m), weight 89.2 kg, last menstrual period 01/26/2021, SpO2 95 %, currently breastfeeding.  Physical Exam Vitals and nursing note reviewed. Exam conducted with a chaperone present.  Constitutional:      Appearance: She is well-developed. She is obese.  Cardiovascular:     Rate and Rhythm: Normal rate.     Heart sounds: Normal heart sounds.  Pulmonary:     Effort: Pulmonary effort is normal.  Abdominal:  Palpations: Abdomen is soft.     Tenderness: There is generalized abdominal tenderness. There is no right CVA tenderness or left CVA tenderness.     Comments: Gravid  Genitourinary:    Comments: Pelvic exam: External genitalia normal, vaginal walls pink and well rugated, cervix visually closed, no lesions noted. Small amount of dark red blood in posterior vault   Skin:    Capillary Refill: Capillary refill takes less than 2 seconds.  Neurological:     Mental Status: She is alert and oriented to person, place, and time.  Psychiatric:        Mood and Affect: Mood normal.        Behavior: Behavior normal.     MAU Course  Procedures  MDM  --[facility] prenatal records reviewed. Posterior placenta, hx term SVD x 4  Orders Placed This Encounter  Procedures   US MFM OB Limited   Urinalysis, Routine w reflex microscopic  Urine, Clean Catch   CBC   Patient Vitals for the past 24 hrs:  BP Temp Temp src Pulse Resp SpO2 Height Weight  06/23/21 0913 108/68 -- -- 97 -- 95 % -- --  06/23/21 0910 (!) 91/50 99 F (37.2 C) Oral 99 16 97 % 5\' 5"  (1.651 m) 89.2 kg   Results for orders placed or performed during the hospital encounter of 06/23/21 (from the past 24 hour(s))  Urinalysis, Routine w reflex microscopic Urine, Clean Catch     Status: Abnormal   Collection Time: 06/23/21  9:00 AM  Result Value Ref Range   Color, Urine YELLOW YELLOW   APPearance CLEAR CLEAR   Specific Gravity, Urine 1.025 1.005 - 1.030   pH 6.5 5.0 - 8.0   Glucose, UA NEGATIVE NEGATIVE mg/dL   Hgb urine dipstick LARGE (A) NEGATIVE   Bilirubin Urine NEGATIVE NEGATIVE   Ketones, ur NEGATIVE NEGATIVE mg/dL   Protein, ur NEGATIVE NEGATIVE mg/dL   Nitrite NEGATIVE NEGATIVE   Leukocytes,Ua TRACE (A) NEGATIVE  Urinalysis, Microscopic (reflex)     Status: Abnormal   Collection Time: 06/23/21  9:00 AM  Result Value Ref Range   RBC / HPF 6-10 0 - 5 RBC/hpf   WBC, UA 0-5 0 - 5 WBC/hpf   Bacteria, UA RARE (A) NONE SEEN   Squamous Epithelial / LPF 0-5 0 - 5   Mucus PRESENT   CBC     Status: Abnormal   Collection Time: 06/23/21  9:39 AM  Result Value Ref Range   WBC 4.8 4.0 - 10.5 K/uL   RBC 3.55 (L) 3.87 - 5.11 MIL/uL   Hemoglobin 10.9 (L) 12.0 - 15.0 g/dL   HCT 16.132.8 (L) 09.636.0 - 04.546.0 %   MCV 92.4 80.0 - 100.0 fL   MCH 30.7 26.0 - 34.0 pg   MCHC 33.2 30.0 - 36.0 g/dL   RDW 40.913.3 81.111.5 - 91.415.5 %   Platelets 140 (L) 150 - 400 K/uL   nRBC 0.0 0.0 - 0.2 %   US MFM OB Limited  Result Date: 06/23/2021 ----------------------------------------------------------------------  OBSTETRICS REPORT                        (Signed Final 06/23/2021 11:22 am) ---------------------------------------------------------------------- Patient Info  ID #:       [id number]                          D.O.B.:  [birth year] (30 yrs)  Name:  Chelsea Wallace [facility]                 Visit Date: 06/23/2021 10:25 am ---------------------------------------------------------------------- Performed By  Attending:        Lin Landsman      Ref. Address:      [facility]  Performed By:     Emeline Darling BS,      [facility]  Referred By:      Warden Fillers MD ---------------------------------------------------------------------- Orders  #  Description                           Code        Ordered By  1  [place] MFM OB LIMITED                     16109.60    Clayton Bibles ----------------------------------------------------------------------  #  Order #                     Accession #                Episode #  1  [id number]                   [id number]                 [id number] ---------------------------------------------------------------------- Indications  Vaginal bleeding in pregnancy, second           O46.92  trimester  [redacted] weeks gestation of pregnancy                 Z3A.20  Obesity complicating pregnancy, second          O99.212  trimester  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 Cord Insertion:       Visualized  Amniotic Fluid  AFI FV:      Within normal limits                              Largest Pocket(cm)                              3.92  RUQ(cm)  3.92  Comment:    No placental abruption or previa identified. ---------------------------------------------------------------------- OB History   Gravidity:    5         Term:   4        Prem:   0        SAB:   0  TOP:          0       Ectopic:  0        Living: 4 ---------------------------------------------------------------------- Gestational Age  LMP:           21w 1d        Date:  01/26/21                  EDD:   11/02/21  Best:          Cherylann Parr 6d     Det. By:  U/S C R L  (04/13/21)    EDD:   11/04/21 ---------------------------------------------------------------------- Anatomy  Diaphragm:             Appears normal         Abdomen:                Appears normal  Stomach:               Appears normal, left   Bladder:                Appears normal                         sided ---------------------------------------------------------------------- Cervix Uterus Adnexa  Cervix  Length:            3.6  cm.  Normal appearance by transabdominal scan.  Right Ovary  Within normal limits.  Left Ovary  Within normal limits.  Adnexa  No abnormality visualized. ---------------------------------------------------------------------- Impression  Limited exam due to maternal vaginal bleeding  Good fetal movement and amniotic fluid.  No evidence of placenta abruption or previa. ---------------------------------------------------------------------- Recommendations  Clinical correlation recommended ----------------------------------------------------------------------              Lin Landsman, MD Electronically Signed Final Report   06/23/2021 11:22 am ----------------------------------------------------------------------    Assessment and Plan  --31 y.o. G5P4004 at [redacted]w[redacted]d  --FHT 152 by Doppler --Postcoital bleeding --Posterior placenta --Hgb 10.9 --No acute findings on MFM Limited ultrasound --Blood type O POS, Rhogam not indicated --Discharge home in stable condition  Calvert Cantor, MSA, MSN, CNM 06/23/2021, 1:43 PM

## 2021-06-29 ENCOUNTER — Ambulatory Visit: Payer: Medicaid Other

## 2021-07-12 ENCOUNTER — Encounter: Payer: Medicaid Other | Admitting: Family Medicine

## 2021-07-12 ENCOUNTER — Encounter: Payer: Medicaid Other | Admitting: Obstetrics

## 2021-07-20 ENCOUNTER — Other Ambulatory Visit: Payer: Self-pay

## 2021-07-20 ENCOUNTER — Ambulatory Visit: Payer: Medicaid Other | Attending: Maternal & Fetal Medicine

## 2021-07-20 ENCOUNTER — Encounter: Payer: Self-pay | Admitting: *Deleted

## 2021-07-20 ENCOUNTER — Ambulatory Visit: Payer: Medicaid Other | Admitting: *Deleted

## 2021-07-20 ENCOUNTER — Encounter (HOSPITAL_COMMUNITY): Payer: Self-pay | Admitting: Obstetrics & Gynecology

## 2021-07-20 ENCOUNTER — Inpatient Hospital Stay (HOSPITAL_COMMUNITY)
Admission: AD | Admit: 2021-07-20 | Discharge: 2021-07-21 | Disposition: A | Discharge status: Home / Self Care | Payer: Medicaid Other | Attending: Obstetrics & Gynecology | Admitting: Obstetrics & Gynecology

## 2021-07-20 VITALS — BP 111/69 | HR 84

## 2021-07-20 DIAGNOSIS — R638 Other symptoms and signs concerning food and fluid intake: Secondary | ICD-10-CM | POA: Diagnosis present

## 2021-07-20 DIAGNOSIS — Z362 Encounter for other antenatal screening follow-up: Secondary | ICD-10-CM

## 2021-07-20 DIAGNOSIS — O285 Abnormal chromosomal and genetic finding on antenatal screening of mother: Secondary | ICD-10-CM

## 2021-07-20 DIAGNOSIS — Z148 Genetic carrier of other disease: Secondary | ICD-10-CM

## 2021-07-20 DIAGNOSIS — O26892 Other specified pregnancy related conditions, second trimester: Secondary | ICD-10-CM | POA: Insufficient documentation

## 2021-07-20 DIAGNOSIS — Y939 Activity, unspecified: Secondary | ICD-10-CM | POA: Insufficient documentation

## 2021-07-20 DIAGNOSIS — O99212 Obesity complicating pregnancy, second trimester: Secondary | ICD-10-CM

## 2021-07-20 DIAGNOSIS — Z3A25 25 weeks gestation of pregnancy: Secondary | ICD-10-CM

## 2021-07-20 DIAGNOSIS — E669 Obesity, unspecified: Secondary | ICD-10-CM | POA: Diagnosis not present

## 2021-07-20 DIAGNOSIS — Z3A24 24 weeks gestation of pregnancy: Secondary | ICD-10-CM

## 2021-07-20 LAB — POCT FERN TEST: POCT Fern Test: NEGATIVE
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
## 2021-07-21 DIAGNOSIS — Y939 Activity, unspecified: Secondary | ICD-10-CM | POA: Diagnosis not present

## 2021-07-21 DIAGNOSIS — Z3A25 25 weeks gestation of pregnancy: Secondary | ICD-10-CM | POA: Diagnosis not present

## 2021-07-21 DIAGNOSIS — O26892 Other specified pregnancy related conditions, second trimester: Secondary | ICD-10-CM | POA: Diagnosis not present

## 2021-07-21 MED ORDER — CYCLOBENZAPRINE HCL 10 MG PO TABS: 10.0000 mg | ORAL_TABLET | Freq: Two times a day (BID) | ORAL | 0 refills | Status: DC | PRN

## 2021-08-09 ENCOUNTER — Encounter: Payer: Self-pay | Admitting: Obstetrics and Gynecology

## 2021-08-09 ENCOUNTER — Ambulatory Visit (INDEPENDENT_AMBULATORY_CARE_PROVIDER_SITE_OTHER): Payer: Medicaid Other | Admitting: Obstetrics and Gynecology

## 2021-08-09 ENCOUNTER — Other Ambulatory Visit: Payer: Medicaid Other

## 2021-08-09 VITALS — BP 111/75 | HR 89 | Wt 202.0 lb

## 2021-08-09 DIAGNOSIS — Z3480 Encounter for supervision of other normal pregnancy, unspecified trimester: Secondary | ICD-10-CM

## 2021-08-09 DIAGNOSIS — Z3009 Encounter for other general counseling and advice on contraception: Secondary | ICD-10-CM

## 2021-08-09 DIAGNOSIS — Z148 Genetic carrier of other disease: Secondary | ICD-10-CM

## 2021-08-09 NOTE — Progress Notes (Signed)
Subjective:  Chelsea Wallace is a 31 y.o. G5P4004 at [redacted]w[redacted]d being seen today for ongoing prenatal care.  She is currently monitored for the following issues for this low-risk pregnancy and has BMI 27.0-27.9,adult; Genetic carrier; Supervision of other normal pregnancy, antepartum; and Unwanted fertility on their problem list.  Patient reports no complaints.  Contractions: Not present. Vag. Bleeding: None.  Movement: Present. Denies leaking of fluid.   The following portions of the patient's history were reviewed and updated as appropriate: allergies, current medications, past family history, past medical history, past social history, past surgical history and problem list. Problem list updated.  Objective:   Vitals:   08/09/21 0831  BP: 111/75  Pulse: 89  Weight: 202 lb (91.6 kg)    Fetal Status: Fetal Heart Rate (bpm): 152   Movement: Present     General:  Alert, oriented and cooperative. Patient is in no acute distress.  Skin: Skin is warm and dry. No rash noted.   Cardiovascular: Normal heart rate noted  Respiratory: Normal respiratory effort, no problems with respiration noted  Abdomen: Soft, gravid, appropriate for gestational age. Pain/Pressure: Absent     Pelvic:  Cervical exam deferred        Extremities: Normal range of motion.  Edema: None  Mental Status: Normal mood and affect. Normal behavior. Normal judgment and thought content.   Urinalysis:      Assessment and Plan:  Pregnancy: G5P4004 at [redacted]w[redacted]d  1. Supervision of other normal pregnancy, antepartum Stable - Glucose Tolerance, 2 Hours w/1 Hour - RPR - CBC - HIV antibody (with reflex)  2. Genetic carrier Stable  3. Unwanted fertility BTL papers today  Preterm labor symptoms and general obstetric precautions including but not limited to vaginal bleeding, contractions, leaking of fluid and fetal movement were reviewed in detail with the patient. Please refer to After Visit Summary for other counseling  recommendations.  Return in about 2 weeks (around 08/23/2021) for OB visit, face to face, any provider.   Hermina Staggers, MD

## 2021-08-09 NOTE — Progress Notes (Signed)
ROB/GTT. Pt declined TDap vaccine.  Reports no problems today.

## 2021-08-09 NOTE — Patient Instructions (Signed)

## 2021-08-10 LAB — HIV ANTIBODY (ROUTINE TESTING W REFLEX): HIV Screen 4th Generation wRfx: NONREACTIVE

## 2021-08-10 LAB — GLUCOSE TOLERANCE, 2 HOURS W/ 1HR
Glucose, 1 hour: 170 mg/dL (ref 70–179)
Glucose, 2 hour: 98 mg/dL (ref 70–152)
Glucose, Fasting: 90 mg/dL (ref 70–91)

## 2021-08-10 LAB — CBC
Hematocrit: 34.9 % (ref 34.0–46.6)
Hemoglobin: 11.5 g/dL (ref 11.1–15.9)
MCH: 30.3 pg (ref 26.6–33.0)
MCHC: 33 g/dL (ref 31.5–35.7)
MCV: 92 fL (ref 79–97)
Platelets: 151 10*3/uL (ref 150–450)
RBC: 3.79 x10E6/uL (ref 3.77–5.28)
RDW: 13.1 % (ref 11.7–15.4)
WBC: 7.1 10*3/uL (ref 3.4–10.8)

## 2021-08-10 LAB — SYPHILIS: RPR W/REFLEX TO RPR TITER AND TREPONEMAL ANTIBODIES, TRADITIONAL SCREENING AND DIAGNOSIS ALGORITHM: RPR Ser Ql: NONREACTIVE

## 2021-08-23 ENCOUNTER — Ambulatory Visit (INDEPENDENT_AMBULATORY_CARE_PROVIDER_SITE_OTHER): Payer: Medicaid Other | Admitting: Obstetrics & Gynecology

## 2021-08-23 ENCOUNTER — Encounter: Payer: Medicaid Other | Admitting: Medical

## 2021-08-23 VITALS — BP 109/72 | HR 84 | Wt 204.0 lb

## 2021-08-23 DIAGNOSIS — Z3480 Encounter for supervision of other normal pregnancy, unspecified trimester: Secondary | ICD-10-CM

## 2021-08-23 DIAGNOSIS — Z23 Encounter for immunization: Secondary | ICD-10-CM

## 2021-08-23 DIAGNOSIS — Z3A29 29 weeks gestation of pregnancy: Secondary | ICD-10-CM

## 2021-08-23 DIAGNOSIS — Z3483 Encounter for supervision of other normal pregnancy, third trimester: Secondary | ICD-10-CM

## 2021-08-23 NOTE — Progress Notes (Signed)
   PRENATAL VISIT NOTE  Subjective:  Chelsea Wallace is a 31 y.o. G5P4004 at [redacted]w[redacted]d being seen today for ongoing prenatal care.  She is currently monitored for the following issues for this low-risk pregnancy and has Obesity in pregnancy, antepartum; Genetic carrier; Supervision of other normal pregnancy, antepartum; and Unwanted fertility on their problem list.  Patient reports no complaints.  Contractions: Not present. Vag. Bleeding: None.  Movement: Present. Denies leaking of fluid.   The following portions of the patient's history were reviewed and updated as appropriate: allergies, current medications, past family history, past medical history, past social history, past surgical history and problem list.   Objective:   Vitals:   08/23/21 1435  BP: 109/72  Pulse: 84  Weight: 204 lb (92.5 kg)    Fetal Status: Fetal Heart Rate (bpm): 146 Fundal Height: 30 cm Movement: Present     General:  Alert, oriented and cooperative. Patient is in no acute distress.  Skin: Skin is warm and dry. No rash noted.   Cardiovascular: Normal heart rate noted  Respiratory: Normal respiratory effort, no problems with respiration noted  Abdomen: Soft, gravid, appropriate for gestational age.  Pain/Pressure: Present     Pelvic: Cervical exam deferred        Extremities: Normal range of motion.  Edema: None  Mental Status: Normal mood and affect. Normal behavior. Normal judgment and thought content.   Assessment and Plan:  Pregnancy: G5P4004 at [redacted]w[redacted]d 1. Need for Tdap vaccination - Tdap vaccine greater than or equal to 7yo IM given  2. [redacted] weeks gestation of pregnancy 3. Supervision of other normal pregnancy, antepartum Normal third trimester labs reviewed with patient. Reviewed contraception plans; she is interested in IUDs vs BTL.  Information given to her. Preterm labor symptoms and general obstetric precautions including but not limited to vaginal bleeding, contractions, leaking of fluid and fetal  movement were reviewed in detail with the patient. Please refer to After Visit Summary for other counseling recommendations.   Return in about 2 weeks (around 09/06/2021) for OFFICE OB VISIT (MD or APP).  Future Appointments  Date Time Provider Department Center  09/06/2021  8:35 AM Raelyn Mora, CNM [facility] None  09/20/2021  4:10 PM Adam Phenix, MD [facility] None  10/04/2021  4:10 PM Constant, Gigi Gin, MD [facility] None  10/11/2021  4:10 PM Warden Fillers, MD [facility] None  10/18/2021  4:10 PM Constant, Gigi Gin, MD [facility] None  10/25/2021  4:10 PM Constant, Gigi Gin, MD [facility] None    Jaynie Collins, MD

## 2021-08-23 NOTE — Patient Instructions (Signed)
Return to office for any scheduled appointments. Call the office or go to the [facility] at [facility] at  if: You begin to have strong, frequent contractions Your water breaks.  Sometimes it is a big gush of fluid, sometimes it is just a trickle that keeps getting your underwear wet or running down your legs You have vaginal bleeding.  It is normal to have a small amount of spotting if your cervix was checked.  You do not feel your baby moving like normal.  If you do not, get something to eat and drink and lay down and focus on feeling your baby move.   If your baby is still not moving like normal, you should call the office or go to MAU. Any other obstetric concerns.  

## 2021-09-06 ENCOUNTER — Encounter: Payer: Self-pay | Admitting: Obstetrics and Gynecology

## 2021-09-06 ENCOUNTER — Telehealth (INDEPENDENT_AMBULATORY_CARE_PROVIDER_SITE_OTHER): Payer: Medicaid Other | Admitting: Obstetrics and Gynecology

## 2021-09-06 DIAGNOSIS — N3001 Acute cystitis with hematuria: Secondary | ICD-10-CM

## 2021-09-06 DIAGNOSIS — Z3A31 31 weeks gestation of pregnancy: Secondary | ICD-10-CM

## 2021-09-06 DIAGNOSIS — R300 Dysuria: Secondary | ICD-10-CM

## 2021-09-06 DIAGNOSIS — O26893 Other specified pregnancy related conditions, third trimester: Secondary | ICD-10-CM

## 2021-09-06 DIAGNOSIS — Z3480 Encounter for supervision of other normal pregnancy, unspecified trimester: Secondary | ICD-10-CM

## 2021-09-06 MED ORDER — CEFADROXIL 500 MG PO CAPS: 500.0000 mg | ORAL_CAPSULE | Freq: Two times a day (BID) | ORAL | 0 refills | Status: DC

## 2021-09-06 MED ORDER — PHENAZOPYRIDINE HCL 200 MG PO TABS: 200.0000 mg | ORAL_TABLET | Freq: Three times a day (TID) | ORAL | 0 refills | Status: AC

## 2021-09-06 NOTE — Progress Notes (Signed)
I connected with  Chelsea Wallace on 09/06/21 by a video enabled telemedicine application and verified that I am speaking with the correct person using two identifiers.   I discussed the limitations of evaluation and management by telemedicine. The patient expressed understanding and agreed to proceed.   MyChart c/o spotting, pelvic pain 8/10, dysuria.  Pt had UA done at her PCP yesterday; +Leu.  She wants to be treated.

## 2021-09-06 NOTE — Patient Instructions (Signed)
Pregnancy and Urinary Tract Infection ? ?A urinary tract infection (UTI) is an infection of any part of the urinary tract. This includes the kidneys, the tubes that connect the kidneys to the bladder (ureters), the bladder, and the tube that carries urine out of the body (urethra). These organs make, store, and get rid of urine in the body. Your health care provider may use other names to describe the infection. An upper UTI affects the ureters and kidneys (pyelonephritis). A lower UTI affects the bladder (cystitis) and urethra (urethritis). ?Most UTIs are caused by bacteria in the genital area, around the entrance to the urinary tract. These bacteria grow and cause irritation and inflammation of the urinary tract. You are more likely to develop a UTI during pregnancy because: ?The physical and hormonal changes that your body goes through make it easier for bacteria to get into your urinary tract. ?Your growing baby puts pressure on your bladder and can affect urine flow. ?Pregnant women with diabetes are at an increased risk for developing a UTI. It is important to recognize and treat UTIs in pregnancy because they can cause serious complications for both you and your baby. ?How does this affect me? ?Symptoms of a UTI include: ?Needing to urinate right away (urgently) and often, even if urinating a small amount. ?Pain, burning, or having a hard time passing urine. ?Blood in the urine. ?Unusual, cloudy, and bad-smelling urine. ?Pain in the abdomen or lower back. ?Vaginal discharge. ?You may also have: ?Vomiting or a decreased appetite. ?Confusion. ?Irritability or tiredness. ?A fever. ?Diarrhea. ?A low level of red blood cells (anemia). ?The development of high blood pressure during pregnancy (preeclampsia). ?How does this affect my baby? ?An untreated UTI during pregnancy could lead to a kidney infection or an infection throughout the mother's body (systemic infection). This can cause health problems and affect the  baby. Possible complications of an untreated UTI include: ?Your baby being born before 37 weeks of pregnancy (premature). ?Your baby being born with a low birth weight. ?Your baby having a higher risk of having his or her skin or the white parts of the eyes turn yellow (jaundice). ?What can I do to lower my risk? ?To prevent a UTI: ?Do not hold urine for long periods of time. Empty your bladder as soon as you feel the urge. ?Always wipe from front to back, especially after a bowel movement. Use each tissue one time when you wipe. ?Empty your bladder after sex. ?Keep your genital area dry. ?Drink 6 to 8 glasses of water each day. ?Do not douche or use deodorant sprays. ?Wear cotton underwear and loose clothing. ?How is this treated? ?Treatment for this condition may include: ?Antibiotic medicines that are safe to take during pregnancy. ?Other medicines to treat less common causes of UTI. ?Follow these instructions at home: ?If you were prescribed an antibiotic medicine, take it as told by your health care provider. Do not stop using the antibiotic even if you start to feel better. ?Keep all follow-up visits. This is important. ?Contact a health care provider if: ?Your symptoms do not improve or they get worse. ?You have abnormal vaginal discharge. ?Get help right away if you: ?Have a fever. ?Have nausea and vomiting. ?Have back or side pain. ?Have lower belly pain, tightness, or feel contractions in your uterus. ?Have a gush of fluid from your vagina. ?Have blood in your urine. ?Summary ?A UTI is an infection of any part of the urinary tract, which includes the kidneys, ureters, bladder,   and urethra. ?Most urinary tract infections are caused by bacteria in your genital area, around the entrance to your urinary tract (urethra). ?You are more likely to develop a UTI during pregnancy. It is important to recognize and treat UTIs in pregnancy because of the risk of serious complications for both you and your baby. ?If you  were prescribed an antibiotic medicine, take it as told by your health care provider. Do not stop using the antibiotic even if you start to feel better. ?This information is not intended to replace advice given to you by your health care provider. Make sure you discuss any questions you have with your health care provider. ?Document Revised: 08/16/2020 Document Reviewed: 08/16/2020 ?Elsevier Patient Education ? 2023 [employer] ? ?

## 2021-09-06 NOTE — Progress Notes (Signed)
[name], female   DOB: 11-Sep-1990, 31 y.o.   MRN: [id number]  [phone]: Attempted to connect with patient via MyChart. Patient was not on the call. MyChart direct text link was sent to patient's phone. Enid Derry, CMA attempted to contact patient as well to notify of visit.  [phone]: Attempted to connect with patient via MyChart. Patient was not on the call. MyChart direct text link was sent to patient's phone.   [id number]: TC made to patient by Enid Derry, CMA. No answer. Unable to leave VM d/t MB being full. MyChart message sent.  Raelyn Mora, CNM

## 2021-09-06 NOTE — Progress Notes (Signed)
MY CHART VIDEO VIRTUAL OBSTETRICS VISIT ENCOUNTER NOTE  I connected with Chelsea Wallace on 09/06/21 at  8:35 AM EDT by My Chart video at work and verified that I am speaking with the correct person using two identifiers. Provider located at [employer] for [employer] at [place].   I discussed the limitations, risks, security and privacy concerns of performing an evaluation and management service by My Chart video and the availability of in person appointments. I also discussed with the patient that there may be a patient responsible charge related to this service. The patient expressed understanding and agreed to proceed.  I discussed the limitations of telemedicine and the availability of in person appointments.  Discussed there is a possibility of technology failure and discussed alternative modes of communication if that failure occurs.  Subjective:  Chelsea Wallace is a 31 y.o. G5P4004 at [redacted]w[redacted]d being followed for ongoing prenatal care.  She is currently monitored for the following issues for this low-risk pregnancy and has Obesity in pregnancy, antepartum; Genetic carrier; Supervision of other normal pregnancy, antepartum; and Unwanted fertility on their problem list.  Patient reports  dysuria and hematuria . Patient states she tried to call yesterday to have this virtual visit switched to an in-person appointment and was told the appointment could not be converted to in-person. She went to her PCP at [facility]) where she left a urine sample and blood was drawn. She is not sure what the results were, because the MD told her he could not prescribe any medication for her until she spoke to her OB about the results.   Reports fetal movement. Denies any contractions, bleeding or leaking of fluid.   The following portions of the patient's history were reviewed and updated as appropriate: allergies, current medications, past family history, past medical history, past  social history, past surgical history and problem list.   Objective:   General:  Alert, oriented and cooperative.   Mental Status: Normal mood and affect perceived. Normal judgment and thought content.  Rest of physical exam deferred due to type of encounter  LMP 01/26/2021  No VS or weight. Patient does not have BP cuff or scale and she is at work.  Assessment and Plan:  Pregnancy: G5P4004 at [redacted]w[redacted]d  1) Supervision of other normal pregnancy, antepartum - Discussed importance of needing VS and weight with each visit. - Will need in-person visits from now on - Apologies given that she was not able to get this appointment converted to an in-person appointment  2) Dysuria during pregnancy in third trimester  - Information provided on UTI in pregnancy  - Patient can come by this afternoon when she gets off work to leave a urine sample for UA and UCx - CCUA and UCx ordered - Rx for cefadroxil (DURICEF) 500 MG capsule,  - Rx for phenazopyridine (PYRIDIUM) 200 MG tablet  3) Hematuria due to acute cystitis  - Rx for cefadroxil (DURICEF) 500 MG capsule,  - Rx for phenazopyridine (PYRIDIUM) 200 MG tablet  4) [redacted] weeks gestation of pregnancy   Preterm labor symptoms and general obstetric precautions including but not limited to vaginal bleeding, contractions, leaking of fluid and fetal movement were reviewed in detail with the patient.  I discussed the assessment and treatment plan with the patient. The patient was provided an opportunity to ask questions and all were answered. The patient agreed with the plan and demonstrated an understanding of the instructions. The patient was advised to call  back or seek an in-person office evaluation/go to MAU at [facility] for any urgent or concerning symptoms. Please refer to After Visit Summary for other counseling recommendations.   I provided 10 minutes of non-face-to-face time during this encounter. There was 5 minutes of chart  review time spent prior to this encounter. Total time spent = 15 minutes.  Return in about 2 weeks (around 09/20/2021) for Return OB visit.  Future Appointments  Date Time Provider Department Center  09/20/2021  4:10 PM Adam Phenix, MD [facility] None  10/04/2021  4:10 PM Constant, Gigi Gin, MD [facility] None  10/11/2021  4:10 PM Warden Fillers, MD [facility] None  10/18/2021  4:10 PM Constant, Gigi Gin, MD [facility] None  10/25/2021  4:10 PM Constant, Gigi Gin, MD [facility] None    Raelyn Mora, CNM Center for [employer], [facility]

## 2021-09-07 ENCOUNTER — Encounter: Payer: Self-pay | Admitting: Obstetrics and Gynecology

## 2021-09-07 LAB — MICROSCOPIC EXAMINATION
Bacteria, UA: NONE SEEN
Casts: NONE SEEN /lpf
Epithelial Cells (non renal): >10 /hpf — AB (ref 0–10)
RBC, Urine: NONE SEEN /hpf (ref 0–2)
WBC, UA: NONE SEEN /hpf (ref 0–5)

## 2021-09-07 LAB — URINALYSIS, ROUTINE W REFLEX MICROSCOPIC
Bilirubin, UA: NEGATIVE
Glucose, UA: NEGATIVE
Leukocytes,UA: NEGATIVE
Nitrite, UA: NEGATIVE
RBC, UA: NEGATIVE
Specific Gravity, UA: >=1.03 — AB (ref 1.005–1.030)
Urobilinogen, Ur: 1 mg/dL (ref 0.2–1.0)
pH, UA: 6 (ref 5.0–7.5)

## 2021-09-08 LAB — CULTURE, OB URINE

## 2021-09-08 LAB — URINE CULTURE, OB REFLEX

## 2021-09-12 ENCOUNTER — Ambulatory Visit (INDEPENDENT_AMBULATORY_CARE_PROVIDER_SITE_OTHER): Payer: Medicaid Other | Admitting: Obstetrics and Gynecology

## 2021-09-12 ENCOUNTER — Encounter: Payer: Self-pay | Admitting: Obstetrics and Gynecology

## 2021-09-12 VITALS — BP 113/72 | HR 91 | Wt 206.0 lb

## 2021-09-12 DIAGNOSIS — Z148 Genetic carrier of other disease: Secondary | ICD-10-CM

## 2021-09-12 DIAGNOSIS — Z3A32 32 weeks gestation of pregnancy: Secondary | ICD-10-CM

## 2021-09-12 DIAGNOSIS — R102 Pelvic and perineal pain: Secondary | ICD-10-CM

## 2021-09-12 DIAGNOSIS — O26893 Other specified pregnancy related conditions, third trimester: Secondary | ICD-10-CM

## 2021-09-12 DIAGNOSIS — Z3009 Encounter for other general counseling and advice on contraception: Secondary | ICD-10-CM

## 2021-09-12 DIAGNOSIS — Z3480 Encounter for supervision of other normal pregnancy, unspecified trimester: Secondary | ICD-10-CM

## 2021-09-12 DIAGNOSIS — Z3483 Encounter for supervision of other normal pregnancy, third trimester: Secondary | ICD-10-CM

## 2021-09-12 DIAGNOSIS — N898 Other specified noninflammatory disorders of vagina: Secondary | ICD-10-CM

## 2021-09-12 DIAGNOSIS — O26899 Other specified pregnancy related conditions, unspecified trimester: Secondary | ICD-10-CM | POA: Insufficient documentation

## 2021-09-12 MED ORDER — CYCLOBENZAPRINE HCL 10 MG PO TABS: 10.0000 mg | ORAL_TABLET | Freq: Three times a day (TID) | ORAL | 1 refills | Status: AC | PRN

## 2021-09-12 NOTE — Progress Notes (Signed)
ROB 32.[redacted] wks GA Visit today for lower abdominal pain, requesting out of work Not using belly support band, sts does not help Reports spotting with wiping today Recent urine culture sent, was started on Duricef, but stopped meds when told culture was negative

## 2021-09-12 NOTE — Progress Notes (Signed)
Subjective:  Chelsea Wallace is a 31 y.o. G5P4004 at [redacted]w[redacted]d being seen today for ongoing prenatal care.  She is currently monitored for the following issues for this low-risk pregnancy and has Obesity in pregnancy, antepartum; Genetic carrier; Supervision of other normal pregnancy, antepartum; Unwanted fertility; and Pelvic pain affecting pregnancy on their problem list.  Patient reports general discomforts of pregnancy, low back and pelvic pain.  Contractions: Irritability. Vag. Bleeding: None.  Movement: Present. Denies leaking of fluid.   The following portions of the patient's history were reviewed and updated as appropriate: allergies, current medications, past family history, past medical history, past social history, past surgical history and problem list. Problem list updated.  Objective:   Vitals:   09/12/21 1454  BP: 113/72  Pulse: 91  Weight: 206 lb (93.4 kg)    Fetal Status: Fetal Heart Rate (bpm): 142   Movement: Present     General:  Alert, oriented and cooperative. Patient is in no acute distress.  Skin: Skin is warm and dry. No rash noted.   Cardiovascular: Normal heart rate noted  Respiratory: Normal respiratory effort, no problems with respiration noted  Abdomen: Soft, gravid, appropriate for gestational age. Pain/Pressure: Present     Pelvic:  Cervical exam performed        Extremities: Normal range of motion.     Mental Status: Normal mood and affect. Normal behavior. Normal judgment and thought content.   Urinalysis:      Assessment and Plan:  Pregnancy: G5P4004 at [redacted]w[redacted]d  1. Supervision of other normal pregnancy, antepartum Stable  2. Genetic carrier Stable  3. Unwanted fertility BTL papers signed  4. Pelvic pain affecting pregnancy in third trimester, antepartum Reviewed with pt. Wears maturity belt but does not help much UC negative and has stopped antibiotics Bladder non tender during exam Reviewed Sx treatment with pt. Pt informed unable to take  out of work for common condition/complaint during pregnancy.   Preterm labor symptoms and general obstetric precautions including but not limited to vaginal bleeding, contractions, leaking of fluid and fetal movement were reviewed in detail with the patient. Please refer to After Visit Summary for other counseling recommendations.  Return in about 2 weeks (around 09/26/2021) for OB visit, face to face, any provider.   Hermina Staggers, MD

## 2021-09-13 ENCOUNTER — Other Ambulatory Visit (HOSPITAL_COMMUNITY)
Admission: RE | Admit: 2021-09-13 | Discharge: 2021-09-13 | Disposition: A | Discharge status: Home / Self Care | Payer: Medicaid Other | Source: Ambulatory Visit | Attending: Obstetrics and Gynecology | Admitting: Obstetrics and Gynecology

## 2021-09-13 DIAGNOSIS — O26893 Other specified pregnancy related conditions, third trimester: Secondary | ICD-10-CM | POA: Diagnosis present

## 2021-09-13 DIAGNOSIS — R102 Pelvic and perineal pain: Secondary | ICD-10-CM | POA: Diagnosis present

## 2021-09-13 DIAGNOSIS — Z3480 Encounter for supervision of other normal pregnancy, unspecified trimester: Secondary | ICD-10-CM | POA: Insufficient documentation

## 2021-09-13 DIAGNOSIS — N898 Other specified noninflammatory disorders of vagina: Secondary | ICD-10-CM | POA: Insufficient documentation

## 2021-09-13 NOTE — Addendum Note (Signed)
Addended by: Marya Landry D on: 09/13/2021 03:33 PM   Modules accepted: Orders

## 2021-09-14 ENCOUNTER — Encounter: Payer: Self-pay | Admitting: Obstetrics and Gynecology

## 2021-09-14 ENCOUNTER — Other Ambulatory Visit: Payer: Self-pay | Admitting: Obstetrics and Gynecology

## 2021-09-14 DIAGNOSIS — B3731 Acute candidiasis of vulva and vagina: Secondary | ICD-10-CM

## 2021-09-14 LAB — CERVICOVAGINAL ANCILLARY ONLY
Bacterial Vaginitis (gardnerella): NEGATIVE
Candida Glabrata: NEGATIVE
Candida Vaginitis: POSITIVE — AB
Chlamydia: NEGATIVE
Comment: NEGATIVE
Comment: NEGATIVE
Comment: NEGATIVE
Comment: NEGATIVE
Comment: NEGATIVE
Comment: NORMAL
Neisseria Gonorrhea: NEGATIVE
Trichomonas: NEGATIVE

## 2021-09-14 MED ORDER — FLUCONAZOLE 150 MG PO TABS: 150.0000 mg | ORAL_TABLET | Freq: Once | ORAL | 0 refills | Status: AC

## 2021-09-20 ENCOUNTER — Ambulatory Visit (INDEPENDENT_AMBULATORY_CARE_PROVIDER_SITE_OTHER): Payer: Medicaid Other | Admitting: Obstetrics and Gynecology

## 2021-09-20 ENCOUNTER — Encounter: Payer: Self-pay | Admitting: Obstetrics and Gynecology

## 2021-09-20 VITALS — BP 114/77 | HR 96 | Wt 206.0 lb

## 2021-09-20 DIAGNOSIS — O99213 Obesity complicating pregnancy, third trimester: Secondary | ICD-10-CM

## 2021-09-20 DIAGNOSIS — Z3483 Encounter for supervision of other normal pregnancy, third trimester: Secondary | ICD-10-CM

## 2021-09-20 DIAGNOSIS — Z3A33 33 weeks gestation of pregnancy: Secondary | ICD-10-CM

## 2021-09-20 DIAGNOSIS — Z3480 Encounter for supervision of other normal pregnancy, unspecified trimester: Secondary | ICD-10-CM

## 2021-09-20 DIAGNOSIS — O99210 Obesity complicating pregnancy, unspecified trimester: Secondary | ICD-10-CM

## 2021-09-20 DIAGNOSIS — Z3009 Encounter for other general counseling and advice on contraception: Secondary | ICD-10-CM

## 2021-09-20 NOTE — Progress Notes (Signed)
   PRENATAL VISIT NOTE  Subjective:  Chelsea Wallace is a 31 y.o. G5P4004 at [redacted]w[redacted]d being seen today for ongoing prenatal care.  She is currently monitored for the following issues for this low-risk pregnancy and has Obesity in pregnancy, antepartum; Genetic carrier; Supervision of other normal pregnancy, antepartum; Unwanted fertility; and Pelvic pain affecting pregnancy on their problem list.  Patient reports no complaints.  Contractions: Irritability. Vag. Bleeding: None.  Movement: Present. Denies leaking of fluid.   The following portions of the patient's history were reviewed and updated as appropriate: allergies, current medications, past family history, past medical history, past social history, past surgical history and problem list.   Objective:   Vitals:   09/20/21 1615  BP: 114/77  Pulse: 96  Weight: 206 lb (93.4 kg)    Fetal Status: Fetal Heart Rate (bpm): 155 Fundal Height: 34 cm Movement: Present     General:  Alert, oriented and cooperative. Patient is in no acute distress.  Skin: Skin is warm and dry. No rash noted.   Cardiovascular: Normal heart rate noted  Respiratory: Normal respiratory effort, no problems with respiration noted  Abdomen: Soft, gravid, appropriate for gestational age.  Pain/Pressure: Present     Pelvic: Cervical exam deferred        Extremities: Normal range of motion.     Mental Status: Normal mood and affect. Normal behavior. Normal judgment and thought content.   Assessment and Plan:  Pregnancy: G5P4004 at [redacted]w[redacted]d 1. Supervision of other normal pregnancy, antepartum Patient is doing well without complaints Cultures next visit  2. Obesity in pregnancy, antepartum Not currently taking ASA  3. Unwanted fertility Patient is now considering IUD  Preterm labor symptoms and general obstetric precautions including but not limited to vaginal bleeding, contractions, leaking of fluid and fetal movement were reviewed in detail with the  patient. Please refer to After Visit Summary for other counseling recommendations.   Return in about 2 weeks (around 10/04/2021) for in person, ROB, Low risk.  Future Appointments  Date Time Provider Department Center  10/04/2021  4:10 PM Adela Esteban, Gigi Gin, MD [facility] None  10/11/2021  4:10 PM Warden Fillers, MD [facility] None  10/18/2021  4:10 PM Tyrece Vanterpool, Gigi Gin, MD [facility] None  10/25/2021  4:10 PM Voula Waln, Gigi Gin, MD [facility] None    Catalina Antigua, MD

## 2021-09-26 ENCOUNTER — Encounter: Payer: Self-pay | Admitting: Emergency Medicine

## 2021-09-26 ENCOUNTER — Telehealth: Payer: Self-pay | Admitting: Emergency Medicine

## 2021-09-26 NOTE — Telephone Encounter (Signed)
TC from patient reporting cough/cold symptoms. States she went PCP for evaluation. Reports increase in contractions with sneezing, coughing. Notes bright red blood when wiping in bathroom with increase in mucus and discharge. Reports +fetal movement.  This RN recommended that patient go to MAU for evaluation d/t vaginal bleeding and increase in ctx. Pt agreed with plan, verbalized understanding.

## 2021-09-27 ENCOUNTER — Inpatient Hospital Stay (HOSPITAL_COMMUNITY)
Admission: AD | Admit: 2021-09-27 | Discharge: 2021-09-27 | Disposition: A | Discharge status: Home / Self Care | Payer: Medicaid Other | Attending: Obstetrics & Gynecology | Admitting: Obstetrics & Gynecology

## 2021-09-27 ENCOUNTER — Inpatient Hospital Stay (HOSPITAL_BASED_OUTPATIENT_CLINIC_OR_DEPARTMENT_OTHER): Payer: Medicaid Other

## 2021-09-27 ENCOUNTER — Ambulatory Visit (INDEPENDENT_AMBULATORY_CARE_PROVIDER_SITE_OTHER): Payer: Medicaid Other | Admitting: Obstetrics and Gynecology

## 2021-09-27 ENCOUNTER — Other Ambulatory Visit (HOSPITAL_COMMUNITY)
Admission: RE | Admit: 2021-09-27 | Discharge: 2021-09-27 | Disposition: A | Discharge status: Home / Self Care | Payer: Medicaid Other | Source: Ambulatory Visit | Attending: Obstetrics and Gynecology | Admitting: Obstetrics and Gynecology

## 2021-09-27 ENCOUNTER — Encounter: Payer: Self-pay | Admitting: Obstetrics and Gynecology

## 2021-09-27 ENCOUNTER — Encounter (HOSPITAL_COMMUNITY): Payer: Self-pay | Admitting: Obstetrics & Gynecology

## 2021-09-27 VITALS — BP 112/76 | HR 98 | Wt 203.0 lb

## 2021-09-27 DIAGNOSIS — O368130 Decreased fetal movements, third trimester, not applicable or unspecified: Secondary | ICD-10-CM

## 2021-09-27 DIAGNOSIS — Z3480 Encounter for supervision of other normal pregnancy, unspecified trimester: Secondary | ICD-10-CM

## 2021-09-27 DIAGNOSIS — Z0371 Encounter for suspected problem with amniotic cavity and membrane ruled out: Secondary | ICD-10-CM

## 2021-09-27 DIAGNOSIS — O42913 Preterm premature rupture of membranes, unspecified as to length of time between rupture and onset of labor, third trimester: Secondary | ICD-10-CM | POA: Insufficient documentation

## 2021-09-27 DIAGNOSIS — Z3A34 34 weeks gestation of pregnancy: Secondary | ICD-10-CM

## 2021-09-27 DIAGNOSIS — Z3689 Encounter for other specified antenatal screening: Secondary | ICD-10-CM | POA: Diagnosis not present

## 2021-09-27 DIAGNOSIS — K219 Gastro-esophageal reflux disease without esophagitis: Secondary | ICD-10-CM | POA: Insufficient documentation

## 2021-09-27 DIAGNOSIS — N939 Abnormal uterine and vaginal bleeding, unspecified: Secondary | ICD-10-CM

## 2021-09-27 DIAGNOSIS — Z3009 Encounter for other general counseling and advice on contraception: Secondary | ICD-10-CM

## 2021-09-27 DIAGNOSIS — O99613 Diseases of the digestive system complicating pregnancy, third trimester: Secondary | ICD-10-CM | POA: Diagnosis present

## 2021-09-27 DIAGNOSIS — O26893 Other specified pregnancy related conditions, third trimester: Secondary | ICD-10-CM

## 2021-09-27 DIAGNOSIS — N898 Other specified noninflammatory disorders of vagina: Secondary | ICD-10-CM

## 2021-09-27 DIAGNOSIS — Z3483 Encounter for supervision of other normal pregnancy, third trimester: Secondary | ICD-10-CM

## 2021-09-27 LAB — URINALYSIS, ROUTINE W REFLEX MICROSCOPIC
Bilirubin Urine: NEGATIVE
Glucose, UA: NEGATIVE mg/dL
Ketones, ur: NEGATIVE mg/dL
Nitrite: NEGATIVE
Protein, ur: 30 mg/dL — AB
Specific Gravity, Urine: 1.024 (ref 1.005–1.030)
pH: 5 (ref 5.0–8.0)

## 2021-09-27 MED ORDER — ALUM & MAG HYDROXIDE-SIMETH 200-200-20 MG/5ML PO SUSP
30.0000 mL | Freq: Once | ORAL | Status: AC
  Administered 2021-09-27: 30 mL via ORAL
  Filled 2021-09-27: qty 30

## 2021-09-27 MED ORDER — LIDOCAINE VISCOUS HCL 2 % MT SOLN
15.0000 mL | Freq: Once | OROMUCOSAL | Status: AC
  Administered 2021-09-27: 15 mL via ORAL
  Filled 2021-09-27: qty 15

## 2021-09-27 MED ORDER — FAMOTIDINE 20 MG PO TABS: 20.0000 mg | ORAL_TABLET | Freq: Every day | ORAL | 0 refills | Status: AC

## 2021-09-27 NOTE — MAU Provider Note (Signed)
History     [id number]  Arrival date and time: 09/27/21 1802    Chief Complaint  Patient presents with   Rupture of Membranes     HPI Chelsea Wallace is a 31 y.o. at [redacted]w[redacted]d who presents for vaginal discharge. Patient reports brown discharge for the last few days. Was seen in the office this afternoon & sent here for further evaluation. Also reports decreased fetal movement today. Can't quantify how many times she's felt baby move but reports that it is less than normal.  Denies contractions, vaginal bleeding, vaginal irritation, or dysuria.    OB History     Gravida  5   Para  4   Term  4   Preterm  0   AB  0   Living  4      SAB  0   IAB  0   Ectopic  0   Multiple  0   Live Births  4           Past Medical History:  Diagnosis Date   GERD (gastroesophageal reflux disease)    on prescription for it; pt cannot remember name of med.    Past Surgical History:  Procedure Laterality Date   MANDIBLE RECONSTRUCTION     lower jaw surgery 2011   MANDIBLE SURGERY Bilateral    age 41 or 73, manibles fx to adjust bite   WISDOM TOOTH EXTRACTION      Family History  Problem Relation Age of Onset   Diabetes Mother    Diabetes Maternal Grandfather    Hypertension Paternal Grandmother    Hypertension Paternal Grandfather     No Known Allergies  No current facility-administered medications on file prior to encounter.   Current Outpatient Medications on File Prior to Encounter  Medication Sig Dispense Refill   cyclobenzaprine (FLEXERIL) 10 MG tablet Take 1 tablet (10 mg total) by mouth every 8 (eight) hours as needed for muscle spasms. 30 tablet 1   Prenat-FeCbn-FeAsp-Meth-FA-DHA (PRENATE MINI) 18-0.6-0.4-350 MG CAPS Take 1 tablet by mouth daily at 8 pm.       ROS Pertinent positives and negative per HPI, all others reviewed and negative  Physical Exam   BP 128/67   Pulse 94   Temp 97.9 F (36.6 C)   Resp 18   Ht 5\' 5"  (1.651 m)   Wt 92.5 kg    LMP 01/26/2021   BMI 33.95 kg/m   Patient Vitals for the past 24 hrs:  BP Temp Pulse Resp Height Weight  09/27/21 1852 128/67 -- 94 -- -- --  09/27/21 1834 117/72 97.9 F (36.6 C) (!) 109 18 5\' 5"  (1.651 m) 92.5 kg    Physical Exam Vitals and nursing note reviewed.  Constitutional:      General: She is not in acute distress.    Appearance: Normal appearance.  HENT:     Head: Normocephalic and atraumatic.  Eyes:     General: No scleral icterus.    Conjunctiva/sclera: Conjunctivae normal.  Pulmonary:     Effort: Pulmonary effort is normal. No respiratory distress.  Neurological:     Mental Status: She is alert.  Psychiatric:        Mood and Affect: Mood normal.        Behavior: Behavior normal.       FHT Baseline 130, moderate variability, 15x15 accels, no decels Toco: none Cat: 1  Labs Results for orders placed or performed during the hospital encounter of 09/27/21 (from the  past 24 hour(s))  Urinalysis, Routine w reflex microscopic Urine, Clean Catch     Status: Abnormal   Collection Time: 09/27/21  8:03 PM  Result Value Ref Range   Color, Urine YELLOW YELLOW   APPearance HAZY (A) CLEAR   Specific Gravity, Urine 1.024 1.005 - 1.030   pH 5.0 5.0 - 8.0   Glucose, UA NEGATIVE NEGATIVE mg/dL   Hgb urine dipstick MODERATE (A) NEGATIVE   Bilirubin Urine NEGATIVE NEGATIVE   Ketones, ur NEGATIVE NEGATIVE mg/dL   Protein, ur 30 (A) NEGATIVE mg/dL   Nitrite NEGATIVE NEGATIVE   Leukocytes,Ua SMALL (A) NEGATIVE   RBC / HPF 21-50 0 - 5 RBC/hpf   WBC, UA 0-5 0 - 5 WBC/hpf   Bacteria, UA FEW (A) NONE SEEN   Squamous Epithelial / LPF 6-10 0 - 5   Mucus PRESENT     Imaging No results found.  MAU Course  Procedures Lab Orders         Urinalysis, Routine w reflex microscopic Urine, Clean Catch     Meds ordered this encounter  Medications   AND Linked Order Group    alum & mag hydroxide-simeth (MAALOX/MYLANTA) 200-200-20 MG/5ML suspension 30 mL    lidocaine  (XYLOCAINE) 2 % viscous mouth solution 15 mL   famotidine (PEPCID) 20 MG tablet    Sig: Take 1 tablet (20 mg total) by mouth daily.    Dispense:  30 tablet    Refill:  0    Order Specific Question:   Supervising Provider    Answer:   Venora Maples [[id number]]   Imaging Orders         [place] MFM FETAL BPP WO NON STRESS     MDM Per review of note & report from Dr. Earlene Plater - patient has speculum exam in office with negative fern & negative pooling.  Per Dr. Macon Large - did not order amnisure due to brown discharge possibly being old blood. Ultrasound was ordered & shows normal AFI.   Reactive NST. BPP 6/8, off for breathing. Reviewed with Dr. Macon Large. Patient ok for discharge home.   At time of discharge, patient requesting treatment of heartburn. Given GI Cocktail in MAU & will prescribe pepcid.  Assessment and Plan   1. Encounter for suspected PROM, with rupture of membranes not found   2. Decreased fetal movements in third trimester, single or unspecified fetus  -Reviewed kick counts & reasons to return to MAU  3. NST (non-stress test) reactive   4. Heartburn during pregnancy in third trimester  -Rx pepcid  5. [redacted] weeks gestation of pregnancy      Judeth Horn, NP 09/27/21 9:24 PM

## 2021-09-27 NOTE — Progress Notes (Signed)
   PRENATAL VISIT NOTE  Subjective:  Chelsea Wallace is a 31 y.o. G5P4004 at [redacted]w[redacted]d being seen today for ongoing prenatal care.  She is currently monitored for the following issues for this low-risk pregnancy and has Obesity in pregnancy, antepartum; Genetic carrier; Supervision of other normal pregnancy, antepartum; Unwanted fertility; and Pelvic pain affecting pregnancy on their problem list.  Patient reports  some reddish spotting yesterday with wiping, and now brownish discharge. Did have a small gush of fluid but when she looked it was brown discharge.  Baby has not been moving as much as usual, but is still moving. No leaking fluid. Contractions with coughing.  Contractions: Irritability. Vag. Bleeding: Scant.  Movement: Present. Denies leaking of fluid.   The following portions of the patient's history were reviewed and updated as appropriate: allergies, current medications, past family history, past medical history, past social history, past surgical history and problem list.   Objective:   Vitals:   09/27/21 1411  BP: 112/76  Pulse: 98  Weight: 203 lb (92.1 kg)    Fetal Status: Fetal Heart Rate (bpm): 130   Movement: Present     General:  Alert, oriented and cooperative. Patient is in no acute distress.  Skin: Skin is warm and dry. No rash noted.   Cardiovascular: Normal heart rate noted  Respiratory: Normal respiratory effort, no problems with respiration noted  Abdomen: Soft, gravid, appropriate for gestational age.  Pain/Pressure: Present     Pelvic: Small amount brownish fluid in vagina, fingertip/thick         Extremities: Normal range of motion.     Mental Status: Normal mood and affect. Normal behavior. Normal judgment and thought content.   Pool negative, fern negative Microscope: copious lactobacillus  Assessment and Plan:  Pregnancy: G5P4004 at [redacted]w[redacted]d  1. Unwanted fertility For BTL  2. Supervision of other normal pregnancy, antepartum  3. [redacted] weeks gestation  of pregnancy  4. Vaginal discharge Swab sent R/o rupture (to MAU)  5. Decreased fetal movements in third trimester, single or unspecified fetus To MAU for monitoring  6. Vaginal bleeding Brownish discharge in vagina No active bleeding   Preterm labor symptoms and general obstetric precautions including but not limited to vaginal bleeding, contractions, leaking of fluid and fetal movement were reviewed in detail with the patient. Please refer to After Visit Summary for other counseling recommendations.   Return in about 2 weeks (around 10/11/2021) for high OB.  Future Appointments  Date Time Provider Department Center  10/04/2021  4:10 PM Constant, Gigi Gin, MD [facility] None  10/11/2021  4:10 PM Warden Fillers, MD [facility] None  10/18/2021  4:10 PM Constant, Gigi Gin, MD [facility] None  10/25/2021  4:10 PM Constant, Gigi Gin, MD [facility] None    Conan Bowens, MD

## 2021-09-27 NOTE — Progress Notes (Signed)
Pt states she has been having some spotting/brown blood since yesterday.  Pt denies recent intercourse, +increase cramping.

## 2021-09-27 NOTE — MAU Note (Signed)
.  Chelsea Wallace is a 31 y.o. at [redacted]w[redacted]d here in MAU reporting: last night she started having increased pelvic pressure that is constant and when she went to the BR she saw pink on the tissue. Went to the BR several more time yesterday and today and has been browning and liquidy,. Also reports s small gush of brownish fluid in her pants last night/. Feels fetal movement but stated is a little less than usual.Went to office appointment today and was told to come to MAU to make sure her water did not break.Marland Kitchen  SVE fingertip and long.    Onset of complaint: last night Pain score: 8 There were no vitals filed for this visit.   FHT:140  Lab orders placed from triage:  u/a

## 2021-09-28 LAB — CERVICOVAGINAL ANCILLARY ONLY
Bacterial Vaginitis (gardnerella): NEGATIVE
Candida Glabrata: NEGATIVE
Candida Vaginitis: NEGATIVE
Chlamydia: NEGATIVE
Comment: NEGATIVE
Comment: NEGATIVE
Comment: NEGATIVE
Comment: NEGATIVE
Comment: NORMAL
Neisseria Gonorrhea: NEGATIVE

## 2021-10-04 ENCOUNTER — Encounter: Payer: Self-pay | Admitting: Obstetrics and Gynecology

## 2021-10-04 ENCOUNTER — Ambulatory Visit (INDEPENDENT_AMBULATORY_CARE_PROVIDER_SITE_OTHER): Payer: Medicaid Other | Admitting: Obstetrics and Gynecology

## 2021-10-04 VITALS — BP 116/72 | HR 112 | Wt 205.0 lb

## 2021-10-04 DIAGNOSIS — Z3009 Encounter for other general counseling and advice on contraception: Secondary | ICD-10-CM

## 2021-10-04 DIAGNOSIS — O99210 Obesity complicating pregnancy, unspecified trimester: Secondary | ICD-10-CM

## 2021-10-04 DIAGNOSIS — Z3480 Encounter for supervision of other normal pregnancy, unspecified trimester: Secondary | ICD-10-CM

## 2021-10-04 DIAGNOSIS — Z3A35 35 weeks gestation of pregnancy: Secondary | ICD-10-CM

## 2021-10-04 DIAGNOSIS — Z3483 Encounter for supervision of other normal pregnancy, third trimester: Secondary | ICD-10-CM

## 2021-10-04 NOTE — Progress Notes (Signed)
Pt presents for ROB reports LOF and feeling like she needs to push x 1 week.  She's still struggling with acid reflux no relief with Pepcid nor Tums.

## 2021-10-04 NOTE — Progress Notes (Signed)
   PRENATAL VISIT NOTE  Subjective:  Chelsea Wallace is a 31 y.o. G5P4004 at [redacted]w[redacted]d being seen today for ongoing prenatal care.  She is currently monitored for the following issues for this low-risk pregnancy and has Obesity in pregnancy, antepartum; Genetic carrier; Supervision of other normal pregnancy, antepartum; Unwanted fertility; and Pelvic pain affecting pregnancy on their problem list.  Patient reports no complaints.  Contractions: Irritability. Vag. Bleeding: None.  Movement: Present. Denies leaking of fluid.   The following portions of the patient's history were reviewed and updated as appropriate: allergies, current medications, past family history, past medical history, past social history, past surgical history and problem list.   Objective:   Vitals:   10/04/21 1613  Weight: 205 lb (93 kg)    Fetal Status:     Movement: Present     General:  Alert, oriented and cooperative. Patient is in no acute distress.  Skin: Skin is warm and dry. No rash noted.   Cardiovascular: Normal heart rate noted  Respiratory: Normal respiratory effort, no problems with respiration noted  Abdomen: Soft, gravid, appropriate for gestational age.  Pain/Pressure: Present     Pelvic: Cervical exam performed in the presence of a chaperone        Extremities: Normal range of motion.  Edema: Trace  Mental Status: Normal mood and affect. Normal behavior. Normal judgment and thought content.   Assessment and Plan:  Pregnancy: [id number] at [redacted]w[redacted]d 1. Supervision of other normal pregnancy, antepartum Patient is doing well without complaint GBS collected  2. Obesity in pregnancy, antepartum   3. Unwanted fertility Forms signed previously  Preterm labor symptoms and general obstetric precautions including but not limited to vaginal bleeding, contractions, leaking of fluid and fetal movement were reviewed in detail with the patient. Please refer to After Visit Summary for other counseling  recommendations.   No follow-ups on file.  Future Appointments  Date Time Provider [place]  10/11/2021  4:10 PM Griffin Basil, MD [facility] None  10/18/2021  4:10 PM Caidin Heidenreich, Vickii Chafe, MD [place] None  10/25/2021  4:10 PM Umberto Pavek, Vickii Chafe, MD [facility] None    Mora Bellman, MD

## 2021-10-06 LAB — STREP GP B NAA: Strep Gp B NAA: NEGATIVE

## 2021-10-11 ENCOUNTER — Ambulatory Visit (INDEPENDENT_AMBULATORY_CARE_PROVIDER_SITE_OTHER): Payer: Medicaid Other | Admitting: Obstetrics and Gynecology

## 2021-10-11 ENCOUNTER — Encounter: Payer: Self-pay | Admitting: Obstetrics and Gynecology

## 2021-10-11 VITALS — BP 125/79 | HR 102 | Wt 206.0 lb

## 2021-10-11 DIAGNOSIS — Z3A36 36 weeks gestation of pregnancy: Secondary | ICD-10-CM

## 2021-10-11 DIAGNOSIS — O26893 Other specified pregnancy related conditions, third trimester: Secondary | ICD-10-CM

## 2021-10-11 DIAGNOSIS — R102 Pelvic and perineal pain: Secondary | ICD-10-CM

## 2021-10-11 DIAGNOSIS — Z3480 Encounter for supervision of other normal pregnancy, unspecified trimester: Secondary | ICD-10-CM

## 2021-10-11 NOTE — Progress Notes (Signed)
   PRENATAL VISIT NOTE  Subjective:  Chelsea Wallace is a 31 y.o. G5P4004 at [redacted]w[redacted]d being seen today for ongoing prenatal care.  She is currently monitored for the following issues for this low-risk pregnancy and has Obesity in pregnancy, antepartum; Genetic carrier; Supervision of other normal pregnancy, antepartum; Unwanted fertility; and Pelvic pain affecting pregnancy on their problem list.  Patient doing well with no acute concerns today. She reports  pelvic pressure .  Contractions: Irritability. Vag. Bleeding: None.  Movement: Present. Denies leaking of fluid.   The following portions of the patient's history were reviewed and updated as appropriate: allergies, current medications, past family history, past medical history, past social history, past surgical history and problem list. Problem list updated.  Objective:   Vitals:   10/11/21 1608  BP: 125/79  Pulse: (!) 102  Weight: 206 lb (93.4 kg)    Fetal Status: Fetal Heart Rate (bpm): 130 Fundal Height: 37 cm Movement: Present     General:  Alert, oriented and cooperative. Patient is in no acute distress.  Skin: Skin is warm and dry. No rash noted.   Cardiovascular: Normal heart rate noted  Respiratory: Normal respiratory effort, no problems with respiration noted  Abdomen: Soft, gravid, appropriate for gestational age.  Pain/Pressure: Present     Pelvic: Cervical exam performed Dilation: 1 Effacement (%): 50 Station: -3  Extremities: Normal range of motion.     Mental Status:  Normal mood and affect. Normal behavior. Normal judgment and thought content.   Assessment and Plan:  Pregnancy: [id number] at [redacted]w[redacted]d  1. [redacted] weeks gestation of pregnancy   2. Supervision of other normal pregnancy, antepartum Continue routine prenatal care Pt is considering elective IOL at 39-40 weeks  3. Pelvic pain affecting pregnancy in third trimester, antepartum C/w pelvic pressure  Term labor symptoms and general obstetric precautions  including but not limited to vaginal bleeding, contractions, leaking of fluid and fetal movement were reviewed in detail with the patient.  Please refer to After Visit Summary for other counseling recommendations.   Return in about 1 week (around 10/18/2021) for ROB, in person.   Lynnda Shields, MD Faculty Attending Center for [facility]

## 2021-10-11 NOTE — Progress Notes (Signed)
Pt states increase in pelvic pressure. 

## 2021-10-18 ENCOUNTER — Ambulatory Visit (INDEPENDENT_AMBULATORY_CARE_PROVIDER_SITE_OTHER): Payer: Medicaid Other | Admitting: Obstetrics and Gynecology

## 2021-10-18 ENCOUNTER — Encounter: Payer: Self-pay | Admitting: Obstetrics and Gynecology

## 2021-10-18 VITALS — BP 114/76 | HR 98 | Wt 207.0 lb

## 2021-10-18 DIAGNOSIS — Z3009 Encounter for other general counseling and advice on contraception: Secondary | ICD-10-CM

## 2021-10-18 DIAGNOSIS — O99210 Obesity complicating pregnancy, unspecified trimester: Secondary | ICD-10-CM

## 2021-10-18 DIAGNOSIS — Z3A37 37 weeks gestation of pregnancy: Secondary | ICD-10-CM

## 2021-10-18 DIAGNOSIS — Z3480 Encounter for supervision of other normal pregnancy, unspecified trimester: Secondary | ICD-10-CM

## 2021-10-18 DIAGNOSIS — Z3483 Encounter for supervision of other normal pregnancy, third trimester: Secondary | ICD-10-CM

## 2021-10-18 DIAGNOSIS — O99213 Obesity complicating pregnancy, third trimester: Secondary | ICD-10-CM

## 2021-10-18 NOTE — Progress Notes (Signed)
   PRENATAL VISIT NOTE  Subjective:  Chelsea Wallace is a 31 y.o. [id number] at [redacted]w[redacted]d being seen today for ongoing prenatal care.  She is currently monitored for the following issues for this low-risk pregnancy and has Obesity in pregnancy, antepartum; Genetic carrier; Supervision of other normal pregnancy, antepartum; Unwanted fertility; and Pelvic pain affecting pregnancy on their problem list.  Patient reports backache.  Contractions: Irregular.  .  Movement: Present. Denies leaking of fluid.   The following portions of the patient's history were reviewed and updated as appropriate: allergies, current medications, past family history, past medical history, past social history, past surgical history and problem list.   Objective:   Vitals:   10/18/21 1605  BP: 114/76  Pulse: 98  Weight: 207 lb (93.9 kg)    Fetal Status: Fetal Heart Rate (bpm): 155 Fundal Height: 39 cm Movement: Present     General:  Alert, oriented and cooperative. Patient is in no acute distress.  Skin: Skin is warm and dry. No rash noted.   Cardiovascular: Normal heart rate noted  Respiratory: Normal respiratory effort, no problems with respiration noted  Abdomen: Soft, gravid, appropriate for gestational age.  Pain/Pressure: Present     Pelvic: Cervical exam performed in the presence of a chaperone Dilation: 1 Effacement (%): Thick Station: Ballotable  Extremities: Normal range of motion.     Mental Status: Normal mood and affect. Normal behavior. Normal judgment and thought content.   Assessment and Plan:  Pregnancy: [id number] at [redacted]w[redacted]d 1. Supervision of other normal pregnancy, antepartum Patient is doing well Desires to plan IOL due to child care IOL scheduled on a Friday either 10/13 or 10/20- orders in EPIC  2. Obesity in pregnancy, antepartum   3. Unwanted fertility Forms previously signed Patient is undecided at this time regarding BTL  Term labor symptoms and general obstetric precautions including  but not limited to vaginal bleeding, contractions, leaking of fluid and fetal movement were reviewed in detail with the patient. Please refer to After Visit Summary for other counseling recommendations.   No follow-ups on file.  Future Appointments  Date Time Provider [place]  10/25/2021  4:10 PM Mabel Unrein, Vickii Chafe, MD [facility] None    Mora Bellman, MD

## 2021-10-22 ENCOUNTER — Encounter (HOSPITAL_COMMUNITY): Payer: Self-pay | Admitting: *Deleted

## 2021-10-22 ENCOUNTER — Telehealth (HOSPITAL_COMMUNITY): Payer: Self-pay | Admitting: *Deleted

## 2021-10-22 NOTE — Telephone Encounter (Signed)
Preadmission screen  

## 2021-10-24 ENCOUNTER — Telehealth: Payer: Self-pay | Admitting: *Deleted

## 2021-10-24 ENCOUNTER — Other Ambulatory Visit: Payer: Self-pay | Admitting: Advanced Practice Midwife

## 2021-10-24 ENCOUNTER — Encounter: Payer: Self-pay | Admitting: *Deleted

## 2021-10-24 ENCOUNTER — Other Ambulatory Visit: Payer: Self-pay | Admitting: *Deleted

## 2021-10-24 NOTE — Telephone Encounter (Signed)
TC from pt requesting letter for LOA for delivery and postpartum recovery. Pt does not yet qualify for FMLA. Letter provided indicating pt is receiving care for pregnancy with [facility] and allowing for LOA 6 wks for vaginal delivery or 8 wks for cesarean section.

## 2021-10-25 ENCOUNTER — Encounter: Payer: Self-pay | Admitting: Obstetrics and Gynecology

## 2021-10-25 ENCOUNTER — Ambulatory Visit (INDEPENDENT_AMBULATORY_CARE_PROVIDER_SITE_OTHER): Payer: Medicaid Other | Admitting: Obstetrics and Gynecology

## 2021-10-25 VITALS — BP 125/83 | HR 97 | Wt 205.7 lb

## 2021-10-25 DIAGNOSIS — Z3A38 38 weeks gestation of pregnancy: Secondary | ICD-10-CM

## 2021-10-25 DIAGNOSIS — O99210 Obesity complicating pregnancy, unspecified trimester: Secondary | ICD-10-CM

## 2021-10-25 DIAGNOSIS — O99213 Obesity complicating pregnancy, third trimester: Secondary | ICD-10-CM

## 2021-10-25 DIAGNOSIS — Z3480 Encounter for supervision of other normal pregnancy, unspecified trimester: Secondary | ICD-10-CM

## 2021-10-25 NOTE — Progress Notes (Signed)
   PRENATAL VISIT NOTE  Subjective:  Chelsea Wallace is a 31 y.o. [id number] at [redacted]w[redacted]d being seen today for ongoing prenatal care.  She is currently monitored for the following issues for this low-risk pregnancy and has Obesity in pregnancy, antepartum; Genetic carrier; Supervision of other normal pregnancy, antepartum; Unwanted fertility; and Pelvic pain affecting pregnancy on their problem list.  Patient reports no complaints.  Contractions: Irregular. Vag. Bleeding: None.  Movement: Present. Denies leaking of fluid.   The following portions of the patient's history were reviewed and updated as appropriate: allergies, current medications, past family history, past medical history, past social history, past surgical history and problem list.   Objective:   Vitals:   10/25/21 1616  BP: 125/83  Pulse: 97  Weight: 205 lb 11.2 oz (93.3 kg)    Fetal Status: Fetal Heart Rate (bpm): 161 Fundal Height: 38 cm Movement: Present     General:  Alert, oriented and cooperative. Patient is in no acute distress.  Skin: Skin is warm and dry. No rash noted.   Cardiovascular: Normal heart rate noted  Respiratory: Normal respiratory effort, no problems with respiration noted  Abdomen: Soft, gravid, appropriate for gestational age.  Pain/Pressure: Present     Pelvic: Cervical exam performed in the presence of a chaperone Dilation: 2 Effacement (%): 50 Station: -3  Extremities: Normal range of motion.  Edema: Trace  Mental Status: Normal mood and affect. Normal behavior. Normal judgment and thought content.   Assessment and Plan:  Pregnancy: [id number] at [redacted]w[redacted]d 1. Supervision of other normal pregnancy, antepartum Patient is doing well without complaints Scheduled for IOL tomorrow  2. Obesity in pregnancy, antepartum   Term labor symptoms and general obstetric precautions including but not limited to vaginal bleeding, contractions, leaking of fluid and fetal movement were reviewed in detail with the  patient. Please refer to After Visit Summary for other counseling recommendations.   No follow-ups on file.  Future Appointments  Date Time Provider [place]  10/26/2021  6:30 AM [facility] [place] None    Mora Bellman, MD

## 2021-10-26 ENCOUNTER — Inpatient Hospital Stay (HOSPITAL_COMMUNITY): Payer: Medicaid Other

## 2021-10-27 ENCOUNTER — Inpatient Hospital Stay (HOSPITAL_COMMUNITY)
Admission: AD | Admit: 2021-10-27 | Discharge: 2021-10-30 | DRG: 798 | Disposition: A | Discharge status: Home / Self Care | Payer: Medicaid Other | Attending: Obstetrics and Gynecology | Admitting: Obstetrics and Gynecology

## 2021-10-27 DIAGNOSIS — Z3A39 39 weeks gestation of pregnancy: Secondary | ICD-10-CM | POA: Diagnosis not present

## 2021-10-27 DIAGNOSIS — O99893 Other specified diseases and conditions complicating puerperium: Secondary | ICD-10-CM | POA: Diagnosis not present

## 2021-10-27 DIAGNOSIS — Z23 Encounter for immunization: Secondary | ICD-10-CM | POA: Diagnosis not present

## 2021-10-27 DIAGNOSIS — Z3480 Encounter for supervision of other normal pregnancy, unspecified trimester: Secondary | ICD-10-CM

## 2021-10-27 DIAGNOSIS — O99214 Obesity complicating childbirth: Secondary | ICD-10-CM | POA: Diagnosis present

## 2021-10-27 DIAGNOSIS — R519 Headache, unspecified: Secondary | ICD-10-CM | POA: Diagnosis not present

## 2021-10-27 DIAGNOSIS — Z3490 Encounter for supervision of normal pregnancy, unspecified, unspecified trimester: Principal | ICD-10-CM | POA: Diagnosis present

## 2021-10-27 DIAGNOSIS — Z148 Genetic carrier of other disease: Secondary | ICD-10-CM | POA: Diagnosis not present

## 2021-10-27 DIAGNOSIS — Z3009 Encounter for other general counseling and advice on contraception: Secondary | ICD-10-CM | POA: Diagnosis present

## 2021-10-27 DIAGNOSIS — Z302 Encounter for sterilization: Secondary | ICD-10-CM

## 2021-10-27 DIAGNOSIS — O26893 Other specified pregnancy related conditions, third trimester: Secondary | ICD-10-CM | POA: Diagnosis present

## 2021-10-28 ENCOUNTER — Inpatient Hospital Stay (HOSPITAL_COMMUNITY): Payer: Medicaid Other | Admitting: Anesthesiology

## 2021-10-28 ENCOUNTER — Encounter (HOSPITAL_COMMUNITY)
Admission: AD | Disposition: A | Discharge status: Home / Self Care | Payer: Self-pay | Source: Home / Self Care | Attending: Obstetrics and Gynecology

## 2021-10-28 ENCOUNTER — Encounter (HOSPITAL_COMMUNITY): Payer: Self-pay | Admitting: Obstetrics and Gynecology

## 2021-10-28 DIAGNOSIS — Z302 Encounter for sterilization: Secondary | ICD-10-CM

## 2021-10-28 DIAGNOSIS — Z3490 Encounter for supervision of normal pregnancy, unspecified, unspecified trimester: Principal | ICD-10-CM | POA: Diagnosis present

## 2021-10-28 DIAGNOSIS — Z3A39 39 weeks gestation of pregnancy: Secondary | ICD-10-CM

## 2021-10-28 HISTORY — PX: TUBAL LIGATION: SHX77

## 2021-10-28 LAB — CBC
HCT: 35.2 % — ABNORMAL LOW (ref 36.0–46.0)
Hemoglobin: 11.8 g/dL — ABNORMAL LOW (ref 12.0–15.0)
MCH: 29.7 pg (ref 26.0–34.0)
MCHC: 33.5 g/dL (ref 30.0–36.0)
MCV: 88.7 fL (ref 80.0–100.0)
Platelets: 172 10*3/uL (ref 150–400)
RBC: 3.97 MIL/uL (ref 3.87–5.11)
RDW: 14.2 % (ref 11.5–15.5)
WBC: 7.2 10*3/uL (ref 4.0–10.5)
nRBC: 0 % (ref 0.0–0.2)

## 2021-10-28 LAB — TYPE AND SCREEN
ABO/RH(D): O POS
Antibody Screen: NEGATIVE

## 2021-10-28 LAB — RPR: RPR Ser Ql: NONREACTIVE

## 2021-10-28 SURGERY — LIGATION, FALLOPIAN TUBE, POSTPARTUM
Anesthesia: Epidural | Laterality: Bilateral

## 2021-10-28 MED ORDER — SOD CITRATE-CITRIC ACID 500-334 MG/5ML PO SOLN
30.0000 mL | ORAL | Status: DC | PRN
  Administered 2021-10-28: 30 mL via ORAL
  Filled 2021-10-28: qty 30

## 2021-10-28 MED ORDER — LIDOCAINE-EPINEPHRINE (PF) 2 %-1:200000 IJ SOLN
INTRAMUSCULAR | Status: DC | PRN
  Administered 2021-10-28 (×3): 5 mL via INTRADERMAL

## 2021-10-28 MED ORDER — SIMETHICONE 80 MG PO CHEW: 80.0000 mg | CHEWABLE_TABLET | ORAL | Status: DC | PRN

## 2021-10-28 MED ORDER — MISOPROSTOL 25 MCG QUARTER TABLET
25.0000 ug | ORAL_TABLET | Freq: Once | ORAL | Status: AC
  Administered 2021-10-28: 25 ug via ORAL
  Filled 2021-10-28: qty 1

## 2021-10-28 MED ORDER — LACTATED RINGERS IV BOLUS
500.0000 mL | Freq: Once | INTRAVENOUS | Status: AC
  Administered 2021-10-28: 500 mL via INTRAVENOUS

## 2021-10-28 MED ORDER — KETOROLAC TROMETHAMINE 30 MG/ML IJ SOLN
INTRAMUSCULAR | Status: DC | PRN
  Administered 2021-10-28: 30 mg via INTRAVENOUS

## 2021-10-28 MED ORDER — LACTATED RINGERS AMNIOINFUSION: INTRAVENOUS | Status: DC

## 2021-10-28 MED ORDER — LIDOCAINE HCL (PF) 1 % IJ SOLN
INTRAMUSCULAR | Status: AC
  Filled 2021-10-28: qty 5

## 2021-10-28 MED ORDER — ACETAMINOPHEN 325 MG PO TABS
650.0000 mg | ORAL_TABLET | ORAL | Status: DC
  Administered 2021-10-28 – 2021-10-30 (×8): 650 mg via ORAL
  Filled 2021-10-28 (×9): qty 2

## 2021-10-28 MED ORDER — OXYTOCIN BOLUS FROM INFUSION
333.0000 mL | Freq: Once | INTRAVENOUS | Status: AC
  Administered 2021-10-28: 333 mL via INTRAVENOUS

## 2021-10-28 MED ORDER — ONDANSETRON HCL 4 MG PO TABS: 4.0000 mg | ORAL_TABLET | ORAL | Status: DC | PRN

## 2021-10-28 MED ORDER — LIDOCAINE HCL (PF) 1 % IJ SOLN
INTRAMUSCULAR | Status: DC | PRN
  Administered 2021-10-28: 10 mL via EPIDURAL
  Administered 2021-10-28: 2 mL via EPIDURAL

## 2021-10-28 MED ORDER — EPHEDRINE 5 MG/ML INJ: 10.0000 mg | INTRAVENOUS | Status: DC | PRN

## 2021-10-28 MED ORDER — TERBUTALINE SULFATE 1 MG/ML IJ SOLN: 0.2500 mg | Freq: Once | INTRAMUSCULAR | Status: DC | PRN

## 2021-10-28 MED ORDER — TETANUS-DIPHTH-ACELL PERTUSSIS 5-2.5-18.5 LF-MCG/0.5 IM SUSY: 0.5000 mL | PREFILLED_SYRINGE | Freq: Once | INTRAMUSCULAR | Status: DC

## 2021-10-28 MED ORDER — ONDANSETRON HCL 4 MG/2ML IJ SOLN
4.0000 mg | INTRAMUSCULAR | Status: DC | PRN
  Administered 2021-10-28: 4 mg via INTRAVENOUS
  Filled 2021-10-28: qty 2

## 2021-10-28 MED ORDER — PHENYLEPHRINE 80 MCG/ML (10ML) SYRINGE FOR IV PUSH (FOR BLOOD PRESSURE SUPPORT)
80.0000 ug | PREFILLED_SYRINGE | INTRAVENOUS | Status: DC | PRN
  Administered 2021-10-28: 80 ug via INTRAVENOUS

## 2021-10-28 MED ORDER — FENTANYL CITRATE (PF) 100 MCG/2ML IJ SOLN
INTRAMUSCULAR | Status: AC
  Filled 2021-10-28: qty 2

## 2021-10-28 MED ORDER — LIDOCAINE-EPINEPHRINE (PF) 2 %-1:200000 IJ SOLN
INTRAMUSCULAR | Status: AC
  Filled 2021-10-28: qty 20

## 2021-10-28 MED ORDER — ACETAMINOPHEN 325 MG PO TABS: 650.0000 mg | ORAL_TABLET | ORAL | Status: DC | PRN

## 2021-10-28 MED ORDER — BENZOCAINE-MENTHOL 20-0.5 % EX AERO: 1.0000 | INHALATION_SPRAY | CUTANEOUS | Status: DC | PRN

## 2021-10-28 MED ORDER — ONDANSETRON HCL 4 MG/2ML IJ SOLN: 4.0000 mg | Freq: Four times a day (QID) | INTRAMUSCULAR | Status: DC | PRN

## 2021-10-28 MED ORDER — DIBUCAINE (PERIANAL) 1 % EX OINT: 1.0000 | TOPICAL_OINTMENT | CUTANEOUS | Status: DC | PRN

## 2021-10-28 MED ORDER — MISOPROSTOL 50MCG HALF TABLET
50.0000 ug | ORAL_TABLET | Freq: Once | ORAL | Status: AC
  Administered 2021-10-28: 50 ug via VAGINAL
  Filled 2021-10-28: qty 1

## 2021-10-28 MED ORDER — MIDAZOLAM HCL 5 MG/5ML IJ SOLN
INTRAMUSCULAR | Status: DC | PRN
  Administered 2021-10-28: 2 mg via INTRAVENOUS

## 2021-10-28 MED ORDER — OXYCODONE-ACETAMINOPHEN 5-325 MG PO TABS: 1.0000 | ORAL_TABLET | ORAL | Status: DC | PRN

## 2021-10-28 MED ORDER — LACTATED RINGERS IV SOLN
500.0000 mL | Freq: Once | INTRAVENOUS | Status: AC
  Administered 2021-10-28: 500 mL via INTRAVENOUS

## 2021-10-28 MED ORDER — LACTATED RINGERS IV SOLN
500.0000 mL | INTRAVENOUS | Status: DC | PRN
  Administered 2021-10-28 (×2): 500 mL via INTRAVENOUS

## 2021-10-28 MED ORDER — WITCH HAZEL-GLYCERIN EX PADS: 1.0000 | MEDICATED_PAD | CUTANEOUS | Status: DC | PRN

## 2021-10-28 MED ORDER — OXYTOCIN-SODIUM CHLORIDE 30-0.9 UT/500ML-% IV SOLN
2.5000 [IU]/h | INTRAVENOUS | Status: DC
  Administered 2021-10-28: 2.5 [IU]/h via INTRAVENOUS

## 2021-10-28 MED ORDER — LACTATED RINGERS IV SOLN: INTRAVENOUS | Status: DC

## 2021-10-28 MED ORDER — COCONUT OIL OIL: 1.0000 | TOPICAL_OIL | Status: DC | PRN

## 2021-10-28 MED ORDER — BUPIVACAINE HCL (PF) 0.25 % IJ SOLN
INTRAMUSCULAR | Status: DC | PRN
  Administered 2021-10-28: 20 mL
  Administered 2021-10-28: 10 mL

## 2021-10-28 MED ORDER — KETOROLAC TROMETHAMINE 30 MG/ML IJ SOLN
INTRAMUSCULAR | Status: AC
  Filled 2021-10-28: qty 1

## 2021-10-28 MED ORDER — DIPHENHYDRAMINE HCL 25 MG PO CAPS: 25.0000 mg | ORAL_CAPSULE | Freq: Four times a day (QID) | ORAL | Status: DC | PRN

## 2021-10-28 MED ORDER — LIDOCAINE HCL (PF) 1 % IJ SOLN: 30.0000 mL | INTRAMUSCULAR | Status: DC | PRN

## 2021-10-28 MED ORDER — SENNOSIDES-DOCUSATE SODIUM 8.6-50 MG PO TABS
2.0000 | ORAL_TABLET | ORAL | Status: DC
  Administered 2021-10-29: 2 via ORAL
  Filled 2021-10-28: qty 2

## 2021-10-28 MED ORDER — PHENYLEPHRINE 80 MCG/ML (10ML) SYRINGE FOR IV PUSH (FOR BLOOD PRESSURE SUPPORT)
80.0000 ug | PREFILLED_SYRINGE | INTRAVENOUS | Status: DC | PRN
  Filled 2021-10-28: qty 10

## 2021-10-28 MED ORDER — MIDAZOLAM HCL 2 MG/2ML IJ SOLN
INTRAMUSCULAR | Status: AC
  Filled 2021-10-28: qty 2

## 2021-10-28 MED ORDER — FENTANYL CITRATE (PF) 100 MCG/2ML IJ SOLN
INTRAMUSCULAR | Status: DC | PRN
  Administered 2021-10-28: 100 ug via INTRAVENOUS

## 2021-10-28 MED ORDER — BUPIVACAINE HCL (PF) 0.25 % IJ SOLN
INTRAMUSCULAR | Status: AC
  Filled 2021-10-28: qty 30

## 2021-10-28 MED ORDER — FAMOTIDINE IN NACL 20-0.9 MG/50ML-% IV SOLN
20.0000 mg | Freq: Once | INTRAVENOUS | Status: AC
  Administered 2021-10-28: 20 mg via INTRAVENOUS
  Filled 2021-10-28: qty 50

## 2021-10-28 MED ORDER — PRENATAL MULTIVITAMIN CH
1.0000 | ORAL_TABLET | Freq: Every day | ORAL | Status: DC
  Administered 2021-10-29 – 2021-10-30 (×2): 1 via ORAL
  Filled 2021-10-28 (×2): qty 1

## 2021-10-28 MED ORDER — IBUPROFEN 600 MG PO TABS
600.0000 mg | ORAL_TABLET | Freq: Four times a day (QID) | ORAL | Status: DC
  Administered 2021-10-28 – 2021-10-30 (×7): 600 mg via ORAL
  Filled 2021-10-28 (×7): qty 1

## 2021-10-28 MED ORDER — ZOLPIDEM TARTRATE 5 MG PO TABS: 5.0000 mg | ORAL_TABLET | Freq: Every evening | ORAL | Status: DC | PRN

## 2021-10-28 MED ORDER — SCOPOLAMINE 1 MG/3DAYS TD PT72
1.0000 | MEDICATED_PATCH | TRANSDERMAL | Status: DC
  Administered 2021-10-28: 1.5 mg via TRANSDERMAL
  Filled 2021-10-28: qty 1

## 2021-10-28 MED ORDER — FENTANYL CITRATE (PF) 100 MCG/2ML IJ SOLN
100.0000 ug | INTRAMUSCULAR | Status: DC | PRN
  Administered 2021-10-28: 100 ug via INTRAVENOUS

## 2021-10-28 MED ORDER — FENTANYL CITRATE (PF) 100 MCG/2ML IJ SOLN
INTRAMUSCULAR | Status: DC | PRN
  Administered 2021-10-28: 100 ug via EPIDURAL

## 2021-10-28 MED ORDER — FENTANYL-BUPIVACAINE-NACL 0.5-0.125-0.9 MG/250ML-% EP SOLN
12.0000 mL/h | EPIDURAL | Status: DC | PRN
  Filled 2021-10-28: qty 250

## 2021-10-28 MED ORDER — LACTATED RINGERS IV SOLN: INTRAVENOUS | Status: DC | PRN

## 2021-10-28 MED ORDER — FENTANYL-BUPIVACAINE-NACL 0.5-0.125-0.9 MG/250ML-% EP SOLN
EPIDURAL | Status: DC | PRN
  Administered 2021-10-28: 12 mL/h via EPIDURAL

## 2021-10-28 MED ORDER — OXYTOCIN-SODIUM CHLORIDE 30-0.9 UT/500ML-% IV SOLN
1.0000 m[IU]/min | INTRAVENOUS | Status: DC
  Administered 2021-10-28: 2 m[IU]/min via INTRAVENOUS
  Filled 2021-10-28: qty 500

## 2021-10-28 MED ORDER — OXYCODONE-ACETAMINOPHEN 5-325 MG PO TABS: 2.0000 | ORAL_TABLET | ORAL | Status: DC | PRN

## 2021-10-28 MED ORDER — DIPHENHYDRAMINE HCL 50 MG/ML IJ SOLN: 12.5000 mg | INTRAMUSCULAR | Status: DC | PRN

## 2021-10-28 SURGICAL SUPPLY — 20 items
CLOTH BEACON ORANGE TIMEOUT ST (SAFETY) ×1 IMPLANT
DRSG OPSITE POSTOP 3X4 (GAUZE/BANDAGES/DRESSINGS) ×1 IMPLANT
DURAPREP 26ML APPLICATOR (WOUND CARE) ×1 IMPLANT
GLOVE BIO SURGEON STRL SZ7.5 (GLOVE) ×1 IMPLANT
GLOVE BIOGEL PI IND STRL 7.0 (GLOVE) ×2 IMPLANT
GOWN STRL REUS W/TWL LRG LVL3 (GOWN DISPOSABLE) ×1 IMPLANT
GOWN STRL REUS W/TWL XL LVL3 (GOWN DISPOSABLE) ×1 IMPLANT
NEEDLE HYPO 22GX1.5 SAFETY (NEEDLE) ×1 IMPLANT
NS IRRIG 1000ML POUR BTL (IV SOLUTION) ×1 IMPLANT
PACK ABDOMINAL MINOR (CUSTOM PROCEDURE TRAY) ×1 IMPLANT
PROTECTOR NERVE ULNAR (MISCELLANEOUS) ×1 IMPLANT
SPONGE LAP 4X18 RFD (DISPOSABLE) IMPLANT
SUT MNCRL AB 4-0 PS2 18 (SUTURE) ×1 IMPLANT
SUT PLAIN 0 NONE (SUTURE) ×1 IMPLANT
SUT VIC AB 0 CT1 27 (SUTURE) ×1
SUT VIC AB 0 CT1 27XBRD ANBCTR (SUTURE) ×1 IMPLANT
SYR CONTROL 10ML LL (SYRINGE) ×1 IMPLANT
TOWEL OR 17X24 6PK STRL BLUE (TOWEL DISPOSABLE) ×2 IMPLANT
TRAY FOLEY CATH SILVER 14FR (SET/KITS/TRAYS/PACK) ×1 IMPLANT
WATER STERILE IRR 1000ML POUR (IV SOLUTION) ×1 IMPLANT

## 2021-10-28 NOTE — Discharge Summary (Signed)
Postpartum Discharge Summary  Date of Service updated***     Patient Name: Chelsea Wallace DOB: Sep 09, 1990 MRN: [id number]  Date of admission: 10/27/2021 Delivery date:10/28/2021  Delivering provider: Concepcion Living  Date of discharge: 10/28/2021  Admitting diagnosis: Encounter for induction of labor [Z34.90] Intrauterine pregnancy: [redacted]w[redacted]d    Secondary diagnosis:  Principal Problem:   Encounter for induction of labor Active Problems:   Vaginal delivery   Supervision of other normal pregnancy, antepartum   Unwanted fertility  Additional problems: ***    Discharge diagnosis: Term Pregnancy Delivered                                              Post partum procedures:postpartum tubal ligation Augmentation: Pitocin, Cytotec, and IP Foley Complications: {OB Labor/Delivery Complications:20784}  Hospital course: Induction of Labor With Vaginal Delivery   31y.o. yo [id number] 346w2das admitted to the hospital 10/27/2021 for induction of labor.  Indication for induction: Elective.  Patient had an uncomplicated labor course.  Membrane Rupture Time/Date: 11:34 AM ,10/28/2021   Delivery Method:Vaginal, Spontaneous  Episiotomy: None  Lacerations:  None  Details of delivery can be found in separate delivery note.  Patient had a postpartum course complicated by***. Patient is discharged home 10/28/21.  Newborn Data: Birth date:10/28/2021  Birth time:2:38 PM  Gender:Female  Living status:Living  Apgars:8 ,9  Weight:   Magnesium Sulfate received: No BMZ received: No Rhophylac:N/A MMR:{MMR:30440033} T-DaP:{Tdap:23962} Flu: {F{IPJ:82505}ransfusion:{Transfusion received:30440034}  Physical exam  Vitals:   10/28/21 1401 10/28/21 1431 10/28/21 1457 10/28/21 1507  BP: (!) 108/56 91/64 116/79 105/73  Pulse: 85 78  83  Resp: '16 16  16  ' Temp:      TempSrc:      SpO2:       General: {Exam; general:21111117} Lochia: {Desc;  appropriate/inappropriate:30686::"appropriate"} Uterine Fundus: {Desc; firm/soft:30687} Incision: {Exam; incision:21111123} DVT Evaluation: {Exam; dvt:2111122} Labs: Lab Results  Component Value Date   WBC 7.2 10/28/2021   HGB 11.8 (L) 10/28/2021   HCT 35.2 (L) 10/28/2021   MCV 88.7 10/28/2021   PLT 172 10/28/2021      Latest Ref Rng & Units 05/14/2019    7:47 PM  CMP  Glucose 70 - 99 mg/dL 106   BUN 6 - 20 mg/dL 9   Creatinine 0.44 - 1.00 mg/dL 0.65   Sodium 135 - 145 mmol/L 135   Potassium 3.5 - 5.1 mmol/L 3.8   Chloride 98 - 111 mmol/L 102   CO2 22 - 32 mmol/L 24   Calcium 8.9 - 10.3 mg/dL 8.7   Total Protein 6.5 - 8.1 g/dL 6.5   Total Bilirubin 0.3 - 1.2 mg/dL 0.7   Alkaline Phos 38 - 126 U/L 53   AST 15 - 41 U/L 20   ALT 0 - 44 U/L 18    Edinburgh Score:    11/22/2019    3:13 PM  Edinburgh Postnatal Depression Scale Screening Tool  I have been able to laugh and see the funny side of things. 0  I have looked forward with enjoyment to things. 0  I have blamed myself unnecessarily when things went wrong. 0  I have been anxious or worried for no good reason. 2  I have felt scared or panicky for no good reason. 0  Things have been getting on top of me. 1  I  have been so unhappy that I have had difficulty sleeping. 0  I have felt sad or miserable. 0  I have been so unhappy that I have been crying. 0  The thought of harming myself has occurred to me. 0  Edinburgh Postnatal Depression Scale Total 3     After visit meds:  Allergies as of 10/28/2021   No Known Allergies   Med Rec must be completed prior to using this [facility]***        Discharge home in stable condition Infant Feeding: {Baby feeding:23562} Infant Disposition:{CHL IP OB HOME WITH SSQSYP:15806} Discharge instruction: per After Visit Summary and Postpartum booklet. Activity: Advance as tolerated. Pelvic rest for 6 weeks.  Diet: {OB BEQU:54883014} Future Appointments:No future  appointments. Follow up Visit:   Please schedule this patient for a In person postpartum visit in 4 weeks with the following provider: Any provider. Additional Postpartum F/U: None    Low risk pregnancy complicated by:  NA Delivery mode:  Vaginal, Spontaneous  Anticipated Birth Control:  BTL done [facility]   10/28/2021 Concepcion Living, MD

## 2021-10-28 NOTE — H&P (Signed)
OBSTETRIC ADMISSION HISTORY AND PHYSICAL  Chelsea Wallace is a 31 y.o. female [phone] with IUP at [redacted]w[redacted]d by LMP presenting for elective IOL. She reports +FMs, No LOF, no VB, no blurry vision, headaches or peripheral edema, and RUQ pain.  She plans on breast feeding. She ris undecided on birth control- IUD vs BTL. 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    Past Surgical History: Past Surgical History:  Procedure Laterality Date   MANDIBLE RECONSTRUCTION     lower jaw surgery 2011   MANDIBLE SURGERY Bilateral    age 35 or 56, manibles fx to adjust bite   WISDOM TOOTH EXTRACTION      Obstetrical History: OB History     Gravida  5   Para  4   Term  4   Preterm  0   AB  0   Living  4      SAB  0   IAB  0   Ectopic  0   Multiple  0   Live Births  4           Social History Social History   Socioeconomic History   Marital status: Single    Spouse name: Not on file   Number of children: Not on file   Years of education: Not on file   Highest education level: Not on file  Occupational History   Not on file  Tobacco Use   Smoking status: Never   Smokeless tobacco: Never  Vaping Use   Vaping Use: Never used  Substance and Sexual Activity   Alcohol use: No   Drug use: No   Sexual activity: Yes    Partners: Male    Birth control/protection: None  Other Topics Concern   Not on file  Social History Narrative   ** Merged History Encounter **       Social Determinants of Health    Financial Resource Strain: Not on file  Food Insecurity: No Food Insecurity (10/28/2021)   Hunger Vital Sign    Worried About Running Out of Food in the Last Year: Never true    Ran Out of Food in the Last Year: Never true  Transportation Needs: No Transportation Needs (10/28/2021)   PRAPARE - 10/30/2021 (Medical): No    Lack of Transportation (Non-Medical): No  Physical Activity: Not on file  Stress: Not on file  Social Connections: Not on file    Family History: Family History  Problem Relation Age of Onset   Diabetes Mother    Diabetes Maternal Grandfather    Hypertension Paternal Grandmother    Hypertension Paternal Grandfather     Allergies: No Known Allergies  Medications Prior to Admission  Medication Sig Dispense Refill Last Dose   cyclobenzaprine (FLEXERIL) 10 MG tablet Take 1 tablet (10 mg total) by mouth every 8 (eight) hours as needed for muscle spasms. (Patient not taking: Reported on 10/04/2021) 30 tablet 1    famotidine (PEPCID) 20 MG tablet Take 1 tablet (  20 mg total) by mouth daily. (Patient not taking: Reported on 10/25/2021) 30 tablet 0    Prenat-FeCbn-FeAsp-Meth-FA-DHA (PRENATE MINI) 18-0.6-0.4-350 MG CAPS Take 1 tablet by mouth daily at 8 pm.      Review of Systems   All systems reviewed and negative except as stated in HPI  Blood pressure 134/78, pulse (!) 117, temperature 98.5 F (36.9 C), temperature source Oral, resp. rate 18, last menstrual period 01/26/2021, SpO2 100 %, currently breastfeeding. General appearance: alert and no distress Lungs: effort normal Heart: regular rate  Abdomen: soft, non-tender; gravid Extremities: no sign of DVT Presentation: cephalic Fetal monitoring: 145 bpm, moderate variability, +15x15 accels, no decels Uterine activity: occ ui  Dilation: 1.5 Effacement (%): 40, 50 Station: -3 Exam by:: D Medha Pippen CNM  Prenatal labs: ABO, Rh: --/--/O POS (10/15 2671) Antibody: NEG (10/15  0054) Rubella: 4.65 (04/06 1609) RPR: Non Reactive (07/27 0927)  HBsAg: Negative (04/06 1609)  HIV: Non Reactive (07/27 0927)  GBS: Negative/-- (09/21 1641)  2 hr Glucola: 90/170/98 Genetic screening: LR NIPS, AFP neg Anatomy US: normal  Prenatal Transfer Tool  Maternal Diabetes: No Genetic Screening: Normal Maternal Ultrasounds/Referrals: Normal Fetal Ultrasounds or other Referrals:  None Maternal Substance Abuse:  No Significant Maternal Medications:  None Significant Maternal Lab Results: Group B Strep negative  Results for orders placed or performed during the hospital encounter of 10/27/21 (from the past 24 hour(s))  CBC   Collection Time: 10/28/21 12:53 AM  Result Value Ref Range   WBC 7.2 4.0 - 10.5 K/uL   RBC 3.97 3.87 - 5.11 MIL/uL   Hemoglobin 11.8 (L) 12.0 - 15.0 g/dL   HCT 35.2 (L) 36.0 - 46.0 %   MCV 88.7 80.0 - 100.0 fL   MCH 29.7 26.0 - 34.0 pg   MCHC 33.5 30.0 - 36.0 g/dL   RDW 14.2 11.5 - 15.5 %   Platelets 172 150 - 400 K/uL   nRBC 0.0 0.0 - 0.2 %  Type and screen   Collection Time: 10/28/21 12:54 AM  Result Value Ref Range   ABO/RH(D) O POS    Antibody Screen NEG    Sample Expiration      10/31/2021,2359 Performed at [facility], [address] [place], [place] [address]     Patient Active Problem List   Diagnosis Date Noted   Encounter for induction of labor 10/28/2021   Pelvic pain affecting pregnancy 09/12/2021   Unwanted fertility 08/09/2021   Supervision of other normal pregnancy, antepartum 02/28/2021   Genetic carrier 08/19/2019   Obesity in pregnancy, antepartum 03/11/2018    Assessment/Plan:  Chelsea Wallace is a 31 y.o. G5P4004 at [redacted]w[redacted]d here for elective IOL at term.  #Labor: Foley balloon placed without difficulty. Cytotec given.  #Pain: Epidural prn #FWB: Cat I #ID: GBS neg #MOF: Breast #MOC: Undecided on BTL (papers signed 7/27) vs IUD, considering postplacental #Circ: N/A   Renee Harder, CNM   10/28/2021, 2:09 AM

## 2021-10-28 NOTE — Anesthesia Preprocedure Evaluation (Signed)
Anesthesia Evaluation  Patient identified by MRN, date of birth, ID band Patient awake    Reviewed: Allergy & Precautions, Patient's Chart, lab work & pertinent test results  Airway Mallampati: II  TM Distance: >3 FB Neck ROM: Full    Dental no notable dental hx.    Pulmonary neg pulmonary ROS,    Pulmonary exam normal breath sounds clear to auscultation       Cardiovascular negative cardio ROS Normal cardiovascular exam Rhythm:Regular Rate:Normal     Neuro/Psych negative neurological ROS  negative psych ROS   GI/Hepatic Neg liver ROS, GERD  Controlled,  Endo/Other  negative endocrine ROS  Renal/GU negative Renal ROS  negative genitourinary   Musculoskeletal negative musculoskeletal ROS (+)   Abdominal   Peds negative pediatric ROS (+)  Hematology negative hematology ROS (+)   Anesthesia Other Findings   Reproductive/Obstetrics (+) Pregnancy Multiple prior epidurals, but per pt had issues w/ never getting comfortable w/ last one in 2021. Per documentation, no boluses given but also poor documentation of pain scores during the 4 hours she had her epidural prior to delivery  Hoping for PPTL after this delivery                              Anesthesia Physical Anesthesia Plan  ASA: 2  Anesthesia Plan: Epidural   Post-op Pain Management:    Induction:   PONV Risk Score and Plan: 2  Airway Management Planned: Natural Airway  Additional Equipment: None  Intra-op Plan:   Post-operative Plan:   Informed Consent: I have reviewed the patients History and Physical, chart, labs and discussed the procedure including the risks, benefits and alternatives for the proposed anesthesia with the patient or authorized representative who has indicated his/her understanding and acceptance.       Plan Discussed with:   Anesthesia Plan Comments:         Anesthesia Quick Evaluation

## 2021-10-28 NOTE — Transfer of Care (Signed)
Immediate Anesthesia Transfer of Care Note  Patient: Chelsea Wallace  Procedure(s) Performed: POST PARTUM TUBAL LIGATION (Bilateral)  Patient Location: PACU  Anesthesia Type:Epidural  Level of Consciousness: awake, alert  and oriented  Airway & Oxygen Therapy: Patient Spontanous Breathing  Post-op Assessment: Report given to RN and Post -op Vital signs reviewed and stable  Post vital signs: Reviewed and stable  Last Vitals:  Vitals Value Taken Time  BP 119/60 10/28/21 1838  Temp    Pulse 108 10/28/21 1839  Resp 16 10/28/21 1839  SpO2 98 % 10/28/21 1839  Vitals shown include unvalidated device data.  Last Pain:  Vitals:   10/28/21 1701  TempSrc:   PainSc: 0-No pain         Complications: No notable events documented.

## 2021-10-28 NOTE — Progress Notes (Signed)
Patient desires bilateral tubal sterilization.  Other reversible forms of contraception were discussed with patient; she declines all other modalities. Discussed bilateral tubal sterilization in detail; discussed options of  bilateral tubal sterilization using Filshie clips vs  bilateral salpingectomy. Risks and benefits discussed in detail including but not limited to: risk of regret, permanence of method, bleeding, infection, injury to surrounding organs and need for additional procedures.  Failure risk of 1-2 % for Filshie clips and <1% for bilateral salpingectomy with increased risk of ectopic gestation if pregnancy occurs was also discussed with patient.  Also discussed possible reduction of risk of ovarian cancer via bilateral salpingectomy given that a growing body of knowledge reveals that the majority of cases of high grade serous "ovarian" cancer actually are actually  cancers arising from the fimbriated end of the fallopian tubes. Emphasized that removal of fallopian tubes do not result in any known hormonal imbalance.  Patient verbalized understanding of these risks and benefits and wants to proceed with sterilization with bilateral salpingectomy    Medicaid papers have been signed. Will proceed to the OR when ready

## 2021-10-28 NOTE — Lactation Note (Signed)
This note was copied from a baby's chart. Baby attempted latch but too sleepy. Mom has expressible colostrom and some was expressed into baby's mouth but infant unable  achieved a sustainable latch.

## 2021-10-28 NOTE — Anesthesia Postprocedure Evaluation (Signed)
Anesthesia Post Note  Patient: Conni Elliot  Procedure(s) Performed: POST PARTUM TUBAL LIGATION (Bilateral)     Patient location during evaluation: PACU Anesthesia Type: Epidural Level of consciousness: awake and alert and oriented Pain management: pain level controlled Vital Signs Assessment: post-procedure vital signs reviewed and stable Respiratory status: spontaneous breathing, nonlabored ventilation and respiratory function stable Cardiovascular status: blood pressure returned to baseline and stable Postop Assessment: no headache, no backache, epidural receding and no apparent nausea or vomiting Anesthetic complications: no   No notable events documented.  Last Vitals:  Vitals:   10/28/21 1701 10/28/21 1838  BP: 122/79 119/60  Pulse: 75 (!) 108  Resp: 16 16  Temp:  36.5 C  SpO2:  98%    Last Pain:  Vitals:   10/28/21 1838  TempSrc: Oral  PainSc: 0-No pain   Pain Goal:    LLE Motor Response: No movement due to regional block (10/28/21 1838)   RLE Motor Response: No movement due to regional block (10/28/21 1838)       Epidural/Spinal Function Cutaneous sensation: No Sensation (10/28/21 1838), Patient able to flex knees: No (10/28/21 1838), Patient able to lift hips off bed: No (10/28/21 1838), Back pain beyond tenderness at insertion site: No (10/28/21 1838), Progressively worsening motor and/or sensory loss: No (10/28/21 1838), Bowel and/or bladder incontinence post epidural: No (10/28/21 1838)  Jarome Matin Kenney

## 2021-10-28 NOTE — Anesthesia Preprocedure Evaluation (Addendum)
Anesthesia Evaluation  Patient identified by MRN, date of birth, ID band Patient awake    Reviewed: Allergy & Precautions, NPO status , Patient's Chart, lab work & pertinent test results  Airway Mallampati: II  TM Distance: >3 FB Neck ROM: Full    Dental no notable dental hx.    Pulmonary neg pulmonary ROS,    Pulmonary exam normal breath sounds clear to auscultation       Cardiovascular negative cardio ROS Normal cardiovascular exam Rhythm:Regular Rate:Normal     Neuro/Psych negative neurological ROS  negative psych ROS   GI/Hepatic Neg liver ROS, GERD  Controlled,  Endo/Other  BMI 34  Renal/GU negative Renal ROS  negative genitourinary   Musculoskeletal negative musculoskeletal ROS (+)   Abdominal   Peds  Hematology  (+) Blood dyscrasia, anemia , Hb 11.8, plt 172   Anesthesia Other Findings   Reproductive/Obstetrics negative OB ROS                            Anesthesia Physical Anesthesia Plan  ASA: 2  Anesthesia Plan: Epidural   Post-op Pain Management:    Induction:   PONV Risk Score and Plan: 2  Airway Management Planned: Natural Airway  Additional Equipment: None  Intra-op Plan:   Post-operative Plan:   Informed Consent: I have reviewed the patients History and Physical, chart, labs and discussed the procedure including the risks, benefits and alternatives for the proposed anesthesia with the patient or authorized representative who has indicated his/her understanding and acceptance.       Plan Discussed with: CRNA  Anesthesia Plan Comments:         Anesthesia Quick Evaluation

## 2021-10-28 NOTE — Progress Notes (Signed)
Chelsea Wallace is a 31 y.o. P8E4235 at [redacted]w[redacted]d admitted for elective IOL.  Subjective: Patient is doing well. Reporting mild contractions.  Objective: BP 125/70   Pulse 84   Temp 97.8 F (36.6 C) (Oral)   Resp 18   LMP 01/26/2021   SpO2 100%  No intake/output data recorded. No intake/output data recorded.  FHT: 115 bpm, moderate variability, +15x15 accels, no decels UC:  Q2-3 mins SVE:   Dilation: 4 Effacement (%): 50 Station: -3 Exam by:: Bridgette Habermann, CNM  Labs: Lab Results  Component Value Date   WBC 7.2 10/28/2021   HGB 11.8 (L) 10/28/2021   HCT 35.2 (L) 10/28/2021   MCV 88.7 10/28/2021   PLT 172 10/28/2021    Assessment / Plan: Chelsea Wallace is a 31 y.o. [id number] at [redacted]w[redacted]d admitted for elective IOL  Labor: S/p FB and Cytotec. Will start Pitocin. AROM when appropriate Fetal Wellbeing:  Category I Pain Control:  Epidural when desired I/D:  GBS neg Anticipated MOD:  NSVD   Renee Harder, CNM 10/28/2021, 8:01 AM

## 2021-10-28 NOTE — Anesthesia Procedure Notes (Signed)
Epidural Patient location during procedure: OB Start time: 10/28/2021 9:28 AM End time: 10/28/2021 9:38 AM  Staffing Anesthesiologist: Pervis Hocking, DO Performed: anesthesiologist   Preanesthetic Checklist Completed: patient identified, IV checked, risks and benefits discussed, monitors and equipment checked, pre-op evaluation and timeout performed  Epidural Patient position: sitting Prep: DuraPrep and site prepped and draped Patient monitoring: continuous pulse ox, blood pressure, heart rate and cardiac monitor Approach: midline Location: L3-L4 Injection technique: LOR air  Needle:  Needle type: Tuohy  Needle gauge: 17 G Needle length: 9 cm Needle insertion depth: 5 cm Catheter type: closed end flexible Catheter size: 19 Gauge Catheter at skin depth: 10 cm Test dose: negative  Assessment Sensory level: T8 Events: blood not aspirated, injection not painful, no injection resistance, no paresthesia and negative IV test  Additional Notes Patient identified. Risks/Benefits/Options discussed with patient including but not limited to bleeding, infection, nerve damage, paralysis, failed block, incomplete pain control, headache, blood pressure changes, nausea, vomiting, reactions to medication both or allergic, itching and postpartum back pain. Confirmed with bedside nurse the patient's most recent platelet count. Confirmed with patient that they are not currently taking any anticoagulation, have any bleeding history or any family history of bleeding disorders. Patient expressed understanding and wished to proceed. All questions were answered. Sterile technique was used throughout the entire procedure. Please see nursing notes for vital signs. Test dose was given through epidural catheter and negative prior to continuing to dose epidural or start infusion. Warning signs of high block given to the patient including shortness of breath, tingling/numbness in hands, complete motor  block, or any concerning symptoms with instructions to call for help. Patient was given instructions on fall risk and not to get out of bed. All questions and concerns addressed with instructions to call with any issues or inadequate analgesia.  Reason for block:procedure for pain

## 2021-10-28 NOTE — Op Note (Signed)
Chelsea Wallace  10/28/2021  PREOPERATIVE DIAGNOSIS:  Multiparity, undesired fertility  POSTOPERATIVE DIAGNOSIS:  Multiparity, undesired fertility  PROCEDURE:  Postpartum Bilateral Tubal **Salpingectomy**   ANESTHESIA:  Epidural and local analgesia using 0.27% Marcaine  COMPLICATIONS:  None immediate.  ESTIMATED BLOOD LOSS: 5 ml.  INDICATIONS: 31 y.o. [id number]  with undesired fertility,status post vaginal delivery, desires permanent sterilization.  Other reversible forms of contraception were discussed with patient; she declines all other modalities. Risks of procedure discussed with patient including but not limited to: risk of regret, permanence of method, bleeding, infection, injury to surrounding organs and need for additional procedures.  Failure risk of 0.5-1% with increased risk of ectopic gestation if pregnancy occurs was also discussed with patient.     FINDINGS:  Normal uterus, tubes, and ovaries.  PROCEDURE DETAILS: The patient was taken to the operating room where her spinal anesthesia was dosed up to surgical level and found to be adequate.  She was then placed in a supine position and prepped and draped in the usual sterile fashion.  After an adequate timeout was performed, attention was turned to the patient's abdomen where a small transverse skin incision was made under the umbilical fold. The incision was taken down to the layer of fascia using the scalpel, and fascia was incised, and extended bilaterally. The peritoneum was entered in a sharp fashion. The patient was placed in Trendelenburg.  A moist lap pad was used to move omentum and bowel away until the left fallopian tube was identified and grasped with a Babcock clamp, and followed out to the fimbriated end. **One Kelly clamps used under the tube with care to remove the fimbriated end and most of the tube on the left. Tube removed with Metzenbaum scissors. A 2-0 plain gut stitch used times 2 under each Claiborne Billings for  hemostasis.  A similar process was carried out on the right side allowing for bilateral tubal sterilization.  Good hemostasis was noted overall.  A similar process was carried out on the right side allowing for bilateral tubal sterilization.  Good hemostasis was noted overall.  Local analgesia was injected into both Filshie application sites.The instruments were then removed from the patient's abdomen and the fascial incision was repaired with 0 Vicryl, and the skin was closed with a 4-0 Vicryl subcuticular stitch. The patient tolerated the procedure well.  Sponge, lap, and needle counts were correct times two.  The patient was then taken to the recovery room awake, extubated and in stable condition.  Concepcion Living MD 10/28/2021 6:52 PM

## 2021-10-29 ENCOUNTER — Encounter (HOSPITAL_COMMUNITY): Payer: Self-pay | Admitting: Obstetrics and Gynecology

## 2021-10-29 ENCOUNTER — Other Ambulatory Visit: Payer: Self-pay

## 2021-10-29 LAB — CBC
HCT: 31 % — ABNORMAL LOW (ref 36.0–46.0)
Hemoglobin: 9.9 g/dL — ABNORMAL LOW (ref 12.0–15.0)
MCH: 28.8 pg (ref 26.0–34.0)
MCHC: 31.9 g/dL (ref 30.0–36.0)
MCV: 90.1 fL (ref 80.0–100.0)
Platelets: 139 10*3/uL — ABNORMAL LOW (ref 150–400)
RBC: 3.44 MIL/uL — ABNORMAL LOW (ref 3.87–5.11)
RDW: 14.3 % (ref 11.5–15.5)
WBC: 8.1 10*3/uL (ref 4.0–10.5)
nRBC: 0 % (ref 0.0–0.2)

## 2021-10-29 MED ORDER — OXYCODONE HCL 5 MG PO TABS
5.0000 mg | ORAL_TABLET | ORAL | Status: DC | PRN
  Administered 2021-10-29 (×2): 5 mg via ORAL
  Filled 2021-10-29 (×2): qty 1

## 2021-10-29 MED ORDER — OXYCODONE HCL 5 MG PO TABS
5.0000 mg | ORAL_TABLET | Freq: Once | ORAL | Status: AC
  Administered 2021-10-29: 5 mg via ORAL
  Filled 2021-10-29: qty 1

## 2021-10-29 NOTE — Anesthesia Postprocedure Evaluation (Signed)
Anesthesia Post Note  Patient: Chelsea Wallace  Procedure(s) Performed: AN AD Kraemer     Patient location during evaluation: Mother Baby Anesthesia Type: Epidural Level of consciousness: awake and alert Pain management: pain level controlled Vital Signs Assessment: post-procedure vital signs reviewed and stable Respiratory status: spontaneous breathing, nonlabored ventilation and respiratory function stable Cardiovascular status: stable Postop Assessment: no headache, no backache and epidural receding Anesthetic complications: no   No notable events documented.  Last Vitals:  Vitals:   10/29/21 0040 10/29/21 0305  BP: 126/77 116/81  Pulse: 85 72  Resp:    Temp: 37 C 36.8 C  SpO2: 99% 99%    Last Pain:  Vitals:   10/29/21 0515  TempSrc:   PainSc: 4    Pain Goal:                   [employer]

## 2021-10-29 NOTE — Lactation Note (Signed)
This note was copied from a baby's chart. Lactation Consultation Note  Patient Name: Chelsea Wallace IWPYK'D Date: 10/29/2021 Reason for consult: Initial assessment;Term Age:29 hours   P5: Term infant at 39+2 weeks Feeding preference: Breast  Birth parent requested latch assistance.  Baby "[name]" had just finished her bath and was STS on birth parent's chest when I arrived.  Birth parent demonstrated hand expression and was able to easily express drops.  "[name]" latched easily, however, showed no interest in initiating a suck.  Multiple attempts without success.  Reassurance given and placed her back STS where she fell asleep.  Offered to return as needed after the hour STS following her bath.  Birth parent will call as needed.  Support person present and asleep.   Maternal Data Has patient been taught Hand Expression?: Yes Does the patient have breastfeeding experience prior to this delivery?: Yes How long did the patient breastfeed?: 6-8 months with all of her other children; youngest one is 42 years old  Feeding Mother's Current Feeding Choice: Breast Milk  LATCH Score Latch: Too sleepy or reluctant, no latch achieved, no sucking elicited.  Audible Swallowing: None  Type of Nipple: Everted at rest and after stimulation  Comfort (Breast/Nipple): Soft / non-tender  Hold (Positioning): Assistance needed to correctly position infant at breast and maintain latch.  LATCH Score: 5   Lactation Tools Discussed/Used    Interventions Interventions: Assisted with latch;Skin to skin  Discharge Pump: Declined  Consult Status Consult Status: Follow-up Date: 10/30/21 Follow-up type: In-patient    Monroe Qin R Anabelen Kaminsky 10/29/2021, 3:38 AM

## 2021-10-29 NOTE — Progress Notes (Addendum)
Post Partum Day 1 (POD 1 s/p BTL) Subjective: no complaints and tolerating PO. Pain is well controlled. Had a headache/nausea overnight but improved with tylenol and ibuprofen, scopolamine, 500 cc bolus LR last night. Has not voided or had flatus this morning. Having trouble with breast feeding.   Objective: Blood pressure 116/81, pulse 72, temperature 98.2 F (36.8 C), temperature source Oral, resp. rate 17, last menstrual period 01/26/2021, SpO2 99 %, unknown if currently breastfeeding.  Physical Exam:  General: alert and cooperative Lochia: appropriate Uterine Fundus: firm Incision: no significant drainage, no significant erythema, dressing in place DVT Evaluation: No evidence of DVT seen on physical exam. Negative Homan's sign. No cords or calf tenderness. No significant calf/ankle edema.  Recent Labs    10/28/21 0053 10/29/21 0625  HGB 11.8* 9.9*  HCT 35.2* 31.0*    Assessment/Plan: Plan for discharge tomorrow, Breastfeeding, Lactation consult, and Contraception s/p BTL.  Headache/nausea sounds like migraine-type headache. Now resolved. Continue tylenol/ibuprofen and prn scopolamine.    LOS: 2 days   Cecilio Asper, MD 10/29/2021, 8:04 AM   GME ATTESTATION:  I saw and evaluated the patient. I agree with the findings and the plan of care as documented in the resident's note. I have made changes to documentation as necessary.  Gerlene Fee, DO OB Fellow, [place] [facility] 10/29/2021, 9:30 AM

## 2021-10-30 ENCOUNTER — Other Ambulatory Visit: Payer: Self-pay

## 2021-10-30 MED ORDER — DIPHENHYDRAMINE HCL 25 MG PO CAPS
25.0000 mg | ORAL_CAPSULE | Freq: Once | ORAL | Status: AC
  Administered 2021-10-30: 25 mg via ORAL
  Filled 2021-10-30: qty 1

## 2021-10-30 MED ORDER — INFLUENZA VAC SPLIT QUAD 0.5 ML IM SUSY: 0.5000 mL | PREFILLED_SYRINGE | INTRAMUSCULAR | Status: DC

## 2021-10-30 MED ORDER — ONDANSETRON HCL 4 MG PO TABS: 4.0000 mg | ORAL_TABLET | ORAL | 0 refills | Status: AC | PRN

## 2021-10-30 MED ORDER — DEXAMETHASONE 6 MG PO TABS
6.0000 mg | ORAL_TABLET | Freq: Once | ORAL | Status: AC
  Administered 2021-10-30: 6 mg via ORAL
  Filled 2021-10-30: qty 1

## 2021-10-30 MED ORDER — METOCLOPRAMIDE HCL 10 MG PO TABS
10.0000 mg | ORAL_TABLET | Freq: Once | ORAL | Status: AC
  Administered 2021-10-30: 10 mg via ORAL
  Filled 2021-10-30: qty 1

## 2021-10-30 MED ORDER — ACETAMINOPHEN 325 MG PO TABS: 650.0000 mg | ORAL_TABLET | ORAL | 0 refills | Status: AC

## 2021-10-30 MED ORDER — IBUPROFEN 600 MG PO TABS: 600.0000 mg | ORAL_TABLET | Freq: Four times a day (QID) | ORAL | 0 refills | Status: AC

## 2021-10-30 MED ORDER — CAFFEINE 200 MG PO TABS
200.0000 mg | ORAL_TABLET | Freq: Once | ORAL | Status: AC
  Administered 2021-10-30: 200 mg via ORAL
  Filled 2021-10-30: qty 1

## 2021-10-30 MED ORDER — INFLUENZA VAC SPLIT QUAD 0.5 ML IM SUSY
0.5000 mL | PREFILLED_SYRINGE | Freq: Once | INTRAMUSCULAR | Status: AC
  Administered 2021-10-30: 0.5 mL via INTRAMUSCULAR
  Filled 2021-10-30: qty 0.5

## 2021-10-30 MED ORDER — COCONUT OIL OIL: 1.0000 | TOPICAL_OIL | 0 refills | Status: AC | PRN

## 2021-10-30 NOTE — Progress Notes (Signed)
[name], female   DOB: 04/28/1990, 31 y.o.   MRN: [id number]  Headache now 5/10, able to lay flat and when flat feels "only pressure" in her head. Anesthesia consulted, Dr. Lissa Hoard agreed to see the patient shortly.   Gaylan Gerold, CNM, MSN, [place] Certified Nurse Midwife, [facility]

## 2021-10-30 NOTE — Lactation Note (Signed)
This note was copied from a baby's chart. Lactation Consultation Note  Patient Name: Chelsea Wallace TIWPY'K Date: 10/30/2021 Reason for consult: Follow-up assessment;Term;Infant weight loss;Difficult latch (6 % weight loss) Age:29 hours LC discussed Supply and demand , importance of consistent pumping when the baby feeds , LC reassured her with consistent pumping her nipple / areola complex will elongate and baby will probably beable to latch.  LC offered the [facility] O/P appt and mom receptive and aware she will receive a call.   Maternal Data    Feeding Mother's Current Feeding Choice: Breast Milk and Formula Nipple Type: Extra Slow Flow    Lactation Tools Discussed/Used Tools: Pump (per mom has not pumped since it was set up) Breast pump type: Double-Electric Breast Pump;Manual  Interventions Interventions: Breast feeding basics reviewed;DEBP;Education;LC Services brochure  Discharge Discharge Education: Engorgement and breast care;Outpatient recommendation;Outpatient Epic message sent;Other (comment) (mom receptive to [facility] O/P visit and aware she will receive call) Pump: DEBP;Personal (per mom has a DEBP Medela at home)  Consult Status Consult Status: Complete Date: 10/30/21    Chelsea Wallace 10/30/2021, 10:53 AM

## 2021-10-30 NOTE — Progress Notes (Signed)
Anesthesia headache eval Called to bedside for questionable PDPH. Severe HA with some postural component late this morning. Hemoglobin dropped ~2g/dl post partum. Given caffeine and fluids, NSAIDs and acetaminophen. When I evaluated the patient she was sitting upright in bed in no distress. She states she "feels fine." Her HA is ~4/10 but much better, but feels worse with lying flat. She has no other concerning neurological signs. I explained the general process of epidural blood patch and she was not eager to proceed. She wants to go home and feels she can manage with her symptoms. I recommended that she return if her headache worsens or she has difficulty with daily tasks. She feels comfortable with this plan. I recommended she continue to push fluids and caffeine as well. Fioricet is also quite helpful in some patient's. Her epidural site was tender to touch, but no redness, drainage or swelling noted. Cold compress is sometimes helpful for insertion site discomfort.   Ultimately, given her presentation sitting upright when I examined her, and her worsening symptoms with lying supine, I believe this has a low likelihood of being PDPH. Again, acetaminophen, NSAIDs, fluids and caffeine for symptoms at home. Fioricet can also be helpful. Return to [facility]U if HA returns or worsens.

## 2021-10-30 NOTE — Addendum Note (Signed)
Addendum  created 10/30/21 1508 by Nolon Nations, MD   Clinical Note Signed

## 2021-10-30 NOTE — Progress Notes (Signed)
[name], female   DOB: Aug 18, 1990, 31 y.o.   MRN: [id number]  Pt still having severe headache rated 9/10, aggravated by standing/moving and unrelieved by Tylenol or ibuprofen, says the oxycodone just made her sleepy but she woke up with the same headache. Also complaining of pain at the epidural site so has been unable to try lying flat. Suspect spinal headache but will treat with headache cocktail + caffeine, will be po as IV already removed. Advised pt and RN to report back if headache not at least mostly relieved within an hour of meds. Will consult anesthesia if headache unrelieved.  Gaylan Gerold, CNM, MSN, [place] Certified Nurse Midwife, [facility]

## 2021-10-31 LAB — SURGICAL PATHOLOGY

## 2021-11-07 ENCOUNTER — Telehealth (HOSPITAL_COMMUNITY): Payer: Self-pay | Admitting: *Deleted

## 2021-11-07 NOTE — Telephone Encounter (Signed)
Mom reports feeling okay. Reports cramping that doesn't stop despite meds. Suggested she notify her OB if pain is constant. EPDS=0 [facility] Psychiatric score=0) Mom reports baby is doing well. Feeding, peeing, and pooping without difficulty. Safe sleep reviewed. Mom reports no concerns about baby at present.  Odis Hollingshead, RN 11-07-2021 at 12:02pm

## 2021-11-27 ENCOUNTER — Ambulatory Visit: Payer: Medicaid Other | Admitting: Obstetrics

## 2021-12-12 ENCOUNTER — Encounter: Payer: Self-pay | Admitting: Obstetrics and Gynecology

## 2021-12-12 ENCOUNTER — Telehealth (INDEPENDENT_AMBULATORY_CARE_PROVIDER_SITE_OTHER): Payer: Medicaid Other | Admitting: Obstetrics and Gynecology

## 2021-12-12 DIAGNOSIS — R5383 Other fatigue: Secondary | ICD-10-CM

## 2021-12-12 MED ORDER — ASCORBIC ACID 500 MG PO TABS: 500.0000 mg | ORAL_TABLET | ORAL | 3 refills | Status: AC

## 2021-12-12 MED ORDER — PRENATE MINI 18-0.6-0.4-350 MG PO CAPS: 1.0000 | ORAL_CAPSULE | Freq: Every day | ORAL | 12 refills | Status: AC

## 2021-12-12 MED ORDER — FERROUS SULFATE 325 (65 FE) MG PO TBEC: 325.0000 mg | DELAYED_RELEASE_TABLET | Freq: Two times a day (BID) | ORAL | 3 refills | Status: AC

## 2021-12-12 NOTE — Progress Notes (Signed)
Provider location: [facility] [place]   Patient location: Home  I connected with Chelsea Wallace on 12/12/21 at 10:55 AM EST by Mychart Video Encounter and verified that I am speaking with the correct person using two identifiers.       I discussed the limitations, risks, security and privacy concerns of performing an evaluation and management service virtually and the availability of in person appointments. I also discussed with the patient that there may be a patient responsible charge related to this service. The patient expressed understanding and agreed to proceed.  Post Partum Visit Note Subjective:   Chelsea Wallace is a 31 y.o. [id number] female who presents for a postpartum visit. She is 6 weeks postpartum following a normal spontaneous vaginal delivery.  I have fully reviewed the prenatal and intrapartum course. The delivery was at 39.2 gestational weeks.  Anesthesia: epidural. Postpartum course has been going well. Baby is doing well. Baby is feeding by bottle - Enfamil AR and Enfamil with Iron. Bleeding: patient currently on cycle. Bowel function is normal. Bladder function is normal. Patient is not sexually active. Contraception method is tubal ligation. Postpartum depression screening: negative.  Patient reports being more tired than usual. Patient would also like a note to return back to work next week.   The pregnancy intention screening data noted above was reviewed. Potential methods of contraception were discussed. The patient elected to proceed with No data recorded.   Edinburgh Postnatal Depression Scale - 12/12/21 1030       Edinburgh Postnatal Depression Scale:  In the Past 7 Days   I have been able to laugh and see the funny side of things. 0    I have looked forward with enjoyment to things. 0    I have blamed myself unnecessarily when things went wrong. 0    I have been anxious or worried for no good reason. 0    I have felt scared or panicky  for no good reason. 0    Things have been getting on top of me. 0    I have been so unhappy that I have had difficulty sleeping. 0    I have felt sad or miserable. 0    I have been so unhappy that I have been crying. 0    The thought of harming myself has occurred to me. 0    Edinburgh Postnatal Depression Scale Total 0            The following portions of the patient's history were reviewed and updated as appropriate: allergies, current medications, past family history, past medical history, past social history, past surgical history, and problem list.  Review of Systems Constitutional: positive for fatigue Eyes: negative Ears, nose, mouth, throat, and face: negative Respiratory: negative Cardiovascular: negative Gastrointestinal: negative Genitourinary:negative Integument/breast: negative Hematologic/lymphatic: negative Musculoskeletal:negative Neurological: negative Behavioral/Psych: negative Endocrine: negative Allergic/Immunologic: negative  Objective:  LMP 12/06/2021 (Exact Date)   Breastfeeding No     General:  Alert, oriented and cooperative. Patient is in no acute distress.  Respiratory: Normal respiratory effort, no problems with respiration noted  Mental Status: Normal mood and affect. Normal behavior. Normal judgment and thought content.  Rest of physical exam deferred due to type of encounter   Assessment:  Encounter for postpartum visit  - Rx: Prenat-FeCbn-FeAsp-Meth-FA-DHA (PRENATE MINI) 18-0.6-0.4-350 MG CAPS - Normal postpartum exam.  Fatigue, unspecified type  - Rx: ferrous sulfate 325 (65 FE) MG EC tablet,  - Rx: ascorbic acid (  VITAMIN C) 500 MG tablet   Plan:  Essential components of care per ACOG recommendations:  1.  Mood and well being: Patient with negative depression screening today. Reviewed local resources for support.  - Patient does not use tobacco.  - hx of drug use? No    2. Infant care and feeding:  -Patient currently breastmilk  feeding? No  -Social determinants of health (SDOH) reviewed in EPIC. No concerns  3. Sexuality, contraception and birth spacing - Patient does not want a pregnancy in the next year.  Desired family size is 5 children.  - Patient had bilateral tubal ligation - salpingectomy immediately PP.   - Discussed birth spacing of 18 months  4. Sleep and fatigue -Encouraged family/partner/community support of 4 hrs of uninterrupted sleep to help with mood and fatigue  5. Physical Recovery  - Discussed patients delivery and complications - Patient had no laceration, perineal healing reviewed. Patient expressed understanding - Patient has urinary incontinence? No - Patient is safe to resume physical and sexual activity  6.  Health Maintenance - Last pap smear not done - last pap was 04/19/2021 and was normal. - Mammogram n/a  7. Chronic Disease - PCP follow up  Total non face-to-face time spent during this encounter was 10 minutes. There was 5 minutes of chart review time spent prior to this encounter. Total time spent = 15 minutes.  Return in about 1 year (around 12/13/2022) for Annual Exam.  No future appointments.   Raelyn Mora, CNM Center for [employer], [facility]

## 2022-01-09 ENCOUNTER — Ambulatory Visit: Payer: Medicaid Other | Admitting: Obstetrics

## 2023-09-29 ENCOUNTER — Encounter: Payer: Self-pay | Admitting: Physician Assistant

## 2023-09-30 ENCOUNTER — Encounter: Payer: Self-pay | Admitting: Physician Assistant

## 2023-10-01 ENCOUNTER — Encounter: Payer: Self-pay | Admitting: Physician Assistant

## 2023-11-19 ENCOUNTER — Encounter: Payer: Self-pay | Admitting: Physician Assistant

## 2023-11-26 ENCOUNTER — Encounter: Payer: Self-pay | Admitting: Physician Assistant
